# Patient Record
Sex: Female | Born: 1980 | Race: Black or African American | Hispanic: No | Smoking: Former smoker
Health system: Southern US, Community
[De-identification: ages and names within clinical notes are randomized; demographics above are authoritative.]

## PROBLEM LIST (undated history)

## (undated) DIAGNOSIS — O039 Complete or unspecified spontaneous abortion without complication: Secondary | ICD-10-CM

## (undated) DIAGNOSIS — E039 Hypothyroidism, unspecified: Secondary | ICD-10-CM

## (undated) DIAGNOSIS — K439 Ventral hernia without obstruction or gangrene: Secondary | ICD-10-CM

## (undated) DIAGNOSIS — K429 Umbilical hernia without obstruction or gangrene: Secondary | ICD-10-CM

## (undated) DIAGNOSIS — J302 Other seasonal allergic rhinitis: Secondary | ICD-10-CM

## (undated) DIAGNOSIS — A599 Trichomoniasis, unspecified: Secondary | ICD-10-CM

## (undated) DIAGNOSIS — E079 Disorder of thyroid, unspecified: Secondary | ICD-10-CM

## (undated) DIAGNOSIS — N83209 Unspecified ovarian cyst, unspecified side: Secondary | ICD-10-CM

## (undated) DIAGNOSIS — B029 Zoster without complications: Secondary | ICD-10-CM

## (undated) DIAGNOSIS — K8020 Calculus of gallbladder without cholecystitis without obstruction: Secondary | ICD-10-CM

## (undated) HISTORY — PX: APPENDECTOMY: SHX54

## (undated) HISTORY — DX: Calculus of gallbladder without cholecystitis without obstruction: K80.20

## (undated) HISTORY — DX: Ventral hernia without obstruction or gangrene: K43.9

## (undated) HISTORY — PX: IRRIGATION AND DEBRIDEMENT ABSCESS: SHX5252

## (undated) HISTORY — PX: ABDOMINAL SURGERY: SHX537

## (undated) HISTORY — PX: DILATION AND CURETTAGE OF UTERUS: SHX78

---

## 2008-02-23 ENCOUNTER — Emergency Department (HOSPITAL_COMMUNITY): Admission: EM | Admit: 2008-02-23 | Discharge: 2008-02-23 | Payer: Self-pay | Admitting: Emergency Medicine

## 2008-06-07 ENCOUNTER — Inpatient Hospital Stay (HOSPITAL_COMMUNITY): Admission: AD | Admit: 2008-06-07 | Discharge: 2008-06-07 | Payer: Self-pay | Admitting: Obstetrics & Gynecology

## 2008-06-07 ENCOUNTER — Ambulatory Visit: Payer: Self-pay | Admitting: Obstetrics and Gynecology

## 2008-09-19 ENCOUNTER — Emergency Department (HOSPITAL_COMMUNITY): Admission: EM | Admit: 2008-09-19 | Discharge: 2008-09-19 | Payer: Self-pay | Admitting: Emergency Medicine

## 2008-11-27 ENCOUNTER — Emergency Department (HOSPITAL_COMMUNITY): Admission: EM | Admit: 2008-11-27 | Discharge: 2008-11-28 | Payer: Self-pay | Admitting: Emergency Medicine

## 2008-11-27 ENCOUNTER — Other Ambulatory Visit: Payer: Self-pay | Admitting: Obstetrics & Gynecology

## 2009-01-24 ENCOUNTER — Inpatient Hospital Stay (HOSPITAL_COMMUNITY): Admission: AD | Admit: 2009-01-24 | Discharge: 2009-01-24 | Payer: Self-pay | Admitting: Obstetrics & Gynecology

## 2009-01-24 ENCOUNTER — Ambulatory Visit: Payer: Self-pay | Admitting: Family Medicine

## 2009-02-10 ENCOUNTER — Emergency Department (HOSPITAL_COMMUNITY): Admission: EM | Admit: 2009-02-10 | Discharge: 2009-02-10 | Payer: Self-pay | Admitting: Emergency Medicine

## 2009-08-30 ENCOUNTER — Emergency Department (HOSPITAL_COMMUNITY): Admission: EM | Admit: 2009-08-30 | Discharge: 2009-08-30 | Payer: Self-pay | Admitting: Emergency Medicine

## 2009-09-01 ENCOUNTER — Ambulatory Visit (HOSPITAL_COMMUNITY): Admission: EM | Admit: 2009-09-01 | Discharge: 2009-09-02 | Payer: Self-pay | Admitting: Emergency Medicine

## 2010-04-28 LAB — URINALYSIS, ROUTINE W REFLEX MICROSCOPIC
Bilirubin Urine: NEGATIVE
Glucose, UA: NEGATIVE mg/dL
Protein, ur: NEGATIVE mg/dL

## 2010-04-28 LAB — ANAEROBIC CULTURE

## 2010-04-28 LAB — DIFFERENTIAL
Basophils Absolute: 0 10*3/uL (ref 0.0–0.1)
Basophils Relative: 0 % (ref 0–1)
Eosinophils Absolute: 0.2 10*3/uL (ref 0.0–0.7)
Lymphocytes Relative: 11 % — ABNORMAL LOW (ref 12–46)
Monocytes Relative: 4 % (ref 3–12)
Neutro Abs: 15.2 10*3/uL — ABNORMAL HIGH (ref 1.7–7.7)

## 2010-04-28 LAB — CBC
HCT: 39.3 % (ref 36.0–46.0)
MCHC: 34.3 g/dL (ref 30.0–36.0)
MCV: 89 fL (ref 78.0–100.0)
Platelets: 251 10*3/uL (ref 150–400)
Platelets: 265 10*3/uL (ref 150–400)
RBC: 4.48 MIL/uL (ref 3.87–5.11)
RDW: 14.2 % (ref 11.5–15.5)
RDW: 14.5 % (ref 11.5–15.5)

## 2010-04-28 LAB — CULTURE, ROUTINE-ABSCESS: Culture: NO GROWTH

## 2010-04-28 LAB — URINE CULTURE: Colony Count: 80000

## 2010-05-14 LAB — URINALYSIS, ROUTINE W REFLEX MICROSCOPIC
Bilirubin Urine: NEGATIVE
Glucose, UA: NEGATIVE mg/dL
Hgb urine dipstick: NEGATIVE
Ketones, ur: NEGATIVE mg/dL
Nitrite: NEGATIVE
Protein, ur: NEGATIVE mg/dL
Specific Gravity, Urine: 1.03 (ref 1.005–1.030)
Urobilinogen, UA: 1 mg/dL (ref 0.0–1.0)
pH: 6 (ref 5.0–8.0)

## 2010-05-14 LAB — CBC
HCT: 42.1 % (ref 36.0–46.0)
Hemoglobin: 14.2 g/dL (ref 12.0–15.0)
MCHC: 33.6 g/dL (ref 30.0–36.0)
MCV: 89 fL (ref 78.0–100.0)
Platelets: 249 K/uL (ref 150–400)
RBC: 4.73 MIL/uL (ref 3.87–5.11)
RDW: 14.8 % (ref 11.5–15.5)
WBC: 11.4 10*3/uL — ABNORMAL HIGH (ref 4.0–10.5)

## 2010-05-14 LAB — GC/CHLAMYDIA PROBE AMP, GENITAL
Chlamydia, DNA Probe: NEGATIVE
GC Probe Amp, Genital: NEGATIVE

## 2010-05-14 LAB — COMPREHENSIVE METABOLIC PANEL WITH GFR
AST: 20 U/L (ref 0–37)
Albumin: 4 g/dL (ref 3.5–5.2)
Alkaline Phosphatase: 56 U/L (ref 39–117)
BUN: 9 mg/dL (ref 6–23)
CO2: 23 meq/L (ref 19–32)
Chloride: 109 meq/L (ref 96–112)
GFR calc Af Amer: >60 mL/min (ref >60)
GFR calc non Af Amer: >60 mL/min (ref >60)
Potassium: 4.2 meq/L (ref 3.5–5.1)
Total Bilirubin: 0.6 mg/dL (ref 0.3–1.2)

## 2010-05-14 LAB — WET PREP, GENITAL
Trich, Wet Prep: NONE SEEN
Yeast Wet Prep HPF POC: NONE SEEN

## 2010-05-14 LAB — COMPREHENSIVE METABOLIC PANEL
ALT: 21 U/L (ref 0–35)
Calcium: 8.9 mg/dL (ref 8.4–10.5)
Creatinine, Ser: 0.75 mg/dL (ref 0.4–1.2)
Glucose, Bld: 106 mg/dL — ABNORMAL HIGH (ref 70–99)
Sodium: 139 mEq/L (ref 135–145)
Total Protein: 7.8 g/dL (ref 6.0–8.3)

## 2010-05-14 LAB — DIFFERENTIAL
Basophils Absolute: 0 K/uL (ref 0.0–0.1)
Basophils Relative: 0 % (ref 0–1)
Eosinophils Absolute: 0.3 K/uL (ref 0.0–0.7)
Eosinophils Relative: 3 % (ref 0–5)
Lymphocytes Relative: 15 % (ref 12–46)
Lymphs Abs: 1.7 10*3/uL (ref 0.7–4.0)
Monocytes Absolute: 0.3 K/uL (ref 0.1–1.0)
Monocytes Relative: 3 % (ref 3–12)
Neutro Abs: 9 10*3/uL — ABNORMAL HIGH (ref 1.7–7.7)
Neutrophils Relative %: 79 % — ABNORMAL HIGH (ref 43–77)

## 2010-05-14 LAB — POCT PREGNANCY, URINE: Preg Test, Ur: NEGATIVE

## 2010-05-14 LAB — LIPASE, BLOOD: Lipase: 21 U/L (ref 11–59)

## 2010-05-15 LAB — URINALYSIS, ROUTINE W REFLEX MICROSCOPIC
Bilirubin Urine: NEGATIVE
Ketones, ur: 15 mg/dL — AB
Leukocytes, UA: NEGATIVE
Nitrite: NEGATIVE
Specific Gravity, Urine: >1.03 — ABNORMAL HIGH (ref 1.005–1.030)
Urobilinogen, UA: 0.2 mg/dL (ref 0.0–1.0)
pH: 5 (ref 5.0–8.0)

## 2010-05-15 LAB — COMPREHENSIVE METABOLIC PANEL
Alkaline Phosphatase: 45 U/L (ref 39–117)
BUN: 10 mg/dL (ref 6–23)
CO2: 27 mEq/L (ref 19–32)
Chloride: 104 mEq/L (ref 96–112)
Creatinine, Ser: 0.78 mg/dL (ref 0.4–1.2)
GFR calc non Af Amer: >60 mL/min (ref >60)
Potassium: 3.7 mEq/L (ref 3.5–5.1)
Total Bilirubin: 1.1 mg/dL (ref 0.3–1.2)

## 2010-05-15 LAB — DIFFERENTIAL
Basophils Absolute: 0 10*3/uL (ref 0.0–0.1)
Basophils Relative: 0 % (ref 0–1)
Eosinophils Relative: 1 % (ref 0–5)
Lymphocytes Relative: 6 % — ABNORMAL LOW (ref 12–46)
Neutro Abs: 8.5 10*3/uL — ABNORMAL HIGH (ref 1.7–7.7)

## 2010-05-15 LAB — CBC
HCT: 45.1 % (ref 36.0–46.0)
Hemoglobin: 14.8 g/dL (ref 12.0–15.0)
MCV: 89.5 fL (ref 78.0–100.0)
RBC: 5.04 MIL/uL (ref 3.87–5.11)
WBC: 9.3 10*3/uL (ref 4.0–10.5)

## 2010-05-15 LAB — TSH: TSH: 0.812 u[IU]/mL (ref 0.350–4.500)

## 2010-05-15 LAB — URINE MICROSCOPIC-ADD ON

## 2010-05-15 LAB — LIPASE, BLOOD: Lipase: 13 U/L (ref 11–59)

## 2010-05-17 LAB — DIFFERENTIAL
Eosinophils Absolute: 0.4 10*3/uL (ref 0.0–0.7)
Lymphs Abs: 3.6 10*3/uL (ref 0.7–4.0)
Monocytes Relative: 4 % (ref 3–12)
Neutro Abs: 4.9 10*3/uL (ref 1.7–7.7)
Neutrophils Relative %: 53 % (ref 43–77)

## 2010-05-17 LAB — URINALYSIS, ROUTINE W REFLEX MICROSCOPIC
Hgb urine dipstick: NEGATIVE
Nitrite: NEGATIVE
Protein, ur: NEGATIVE mg/dL
Specific Gravity, Urine: >1.03 — ABNORMAL HIGH (ref 1.005–1.030)
Urobilinogen, UA: 0.2 mg/dL (ref 0.0–1.0)

## 2010-05-17 LAB — CBC
MCV: 90.1 fL (ref 78.0–100.0)
Platelets: 235 10*3/uL (ref 150–400)
RBC: 4.39 MIL/uL (ref 3.87–5.11)
WBC: 9.4 10*3/uL (ref 4.0–10.5)

## 2010-05-17 LAB — POCT PREGNANCY, URINE: Preg Test, Ur: NEGATIVE

## 2010-05-17 LAB — BASIC METABOLIC PANEL
BUN: 7 mg/dL (ref 6–23)
Calcium: 9.2 mg/dL (ref 8.4–10.5)
Creatinine, Ser: 0.81 mg/dL (ref 0.4–1.2)
GFR calc Af Amer: >60 mL/min (ref >60)
GFR calc non Af Amer: >60 mL/min (ref >60)

## 2010-05-20 LAB — URINE MICROSCOPIC-ADD ON

## 2010-05-20 LAB — URINALYSIS, ROUTINE W REFLEX MICROSCOPIC
Glucose, UA: NEGATIVE mg/dL
Protein, ur: NEGATIVE mg/dL
Specific Gravity, Urine: 1.026 (ref 1.005–1.030)
Urobilinogen, UA: 0.2 mg/dL (ref 0.0–1.0)

## 2010-05-23 LAB — URINALYSIS, ROUTINE W REFLEX MICROSCOPIC
Bilirubin Urine: NEGATIVE
Glucose, UA: NEGATIVE mg/dL
Hgb urine dipstick: NEGATIVE
Specific Gravity, Urine: 1.025 (ref 1.005–1.030)

## 2010-05-23 LAB — TSH: TSH: 1.092 u[IU]/mL (ref 0.350–4.500)

## 2010-10-18 ENCOUNTER — Emergency Department (HOSPITAL_COMMUNITY)
Admission: EM | Admit: 2010-10-18 | Discharge: 2010-10-18 | Disposition: A | Discharge status: Home / Self Care | Payer: Medicaid Other | Attending: Emergency Medicine | Admitting: Emergency Medicine

## 2010-10-18 DIAGNOSIS — E039 Hypothyroidism, unspecified: Secondary | ICD-10-CM | POA: Insufficient documentation

## 2010-10-18 DIAGNOSIS — R51 Headache: Secondary | ICD-10-CM | POA: Insufficient documentation

## 2010-10-18 DIAGNOSIS — R509 Fever, unspecified: Secondary | ICD-10-CM | POA: Insufficient documentation

## 2010-10-18 DIAGNOSIS — R197 Diarrhea, unspecified: Secondary | ICD-10-CM | POA: Insufficient documentation

## 2010-10-18 DIAGNOSIS — IMO0001 Reserved for inherently not codable concepts without codable children: Secondary | ICD-10-CM | POA: Insufficient documentation

## 2010-10-18 LAB — DIFFERENTIAL
Lymphocytes Relative: 42 % (ref 12–46)
Lymphs Abs: 2.6 10*3/uL (ref 0.7–4.0)
Monocytes Relative: 5 % (ref 3–12)
Neutro Abs: 3.1 10*3/uL (ref 1.7–7.7)
Neutrophils Relative %: 50 % (ref 43–77)

## 2010-10-18 LAB — CBC
HCT: 39.2 % (ref 36.0–46.0)
Hemoglobin: 13.1 g/dL (ref 12.0–15.0)
MCH: 29 pg (ref 26.0–34.0)
MCV: 86.9 fL (ref 78.0–100.0)
RBC: 4.51 MIL/uL (ref 3.87–5.11)

## 2010-10-18 LAB — POCT I-STAT, CHEM 8
BUN: 8 mg/dL (ref 6–23)
Chloride: 107 mEq/L (ref 96–112)
Sodium: 138 mEq/L (ref 135–145)

## 2010-12-29 ENCOUNTER — Emergency Department (HOSPITAL_COMMUNITY)
Admission: EM | Admit: 2010-12-29 | Discharge: 2010-12-29 | Disposition: A | Discharge status: Home / Self Care | Payer: Self-pay | Attending: Emergency Medicine | Admitting: Emergency Medicine

## 2010-12-29 DIAGNOSIS — F17200 Nicotine dependence, unspecified, uncomplicated: Secondary | ICD-10-CM | POA: Insufficient documentation

## 2010-12-29 DIAGNOSIS — L02419 Cutaneous abscess of limb, unspecified: Secondary | ICD-10-CM | POA: Insufficient documentation

## 2010-12-29 DIAGNOSIS — R6883 Chills (without fever): Secondary | ICD-10-CM | POA: Insufficient documentation

## 2010-12-29 DIAGNOSIS — L0291 Cutaneous abscess, unspecified: Secondary | ICD-10-CM

## 2010-12-29 MED ORDER — LIDOCAINE-EPINEPHRINE 2 %-1:100000 IJ SOLN
INTRAMUSCULAR | Status: AC
  Filled 2010-12-29: qty 1

## 2010-12-29 MED ORDER — DOXYCYCLINE HYCLATE 100 MG PO CAPS: 100.0000 mg | ORAL_CAPSULE | Freq: Two times a day (BID) | ORAL | Status: AC

## 2010-12-29 MED ORDER — HYDROCODONE-ACETAMINOPHEN 5-325 MG PO TABS: 1.0000 | ORAL_TABLET | Freq: Four times a day (QID) | ORAL | Status: AC | PRN

## 2010-12-29 NOTE — ED Provider Notes (Signed)
History     CSN: [id number] Arrival date & time: 12/29/2010  9:08 AM   First MD Initiated Contact with Patient 12/29/10 [phone]      Chief Complaint  Patient presents with  . Abscess    pt states abscess inner right thigh states no present drainage states painful to touch     (Consider location/radiation/quality/duration/timing/severity/associated sxs/prior treatment) Patient is a 30 y.o. female presenting with abscess. The history is provided by the patient.  Abscess  This is a recurrent problem. The current episode started less than one week ago. The onset was gradual. The problem occurs rarely. The problem has been gradually worsening. Affected Location: right upper inner thigh. The problem is severe. The abscess is characterized by painfulness and swelling. The abscess first occurred at home. Pertinent negatives include no fever, no vomiting and no cough. Associated symptoms comments: Positive chills. Past medical history comments: Pt reports a prior perirectal abscess that failed ED treatment and required surgical drainage.    History reviewed. No pertinent past medical history.  History reviewed. No pertinent past surgical history.  No family history on file.  History  Substance Use Topics  . Smoking status: Current Everyday Smoker  . Smokeless tobacco: Not on file  . Alcohol Use: No     Review of Systems  Constitutional: Positive for chills. Negative for fever.  HENT: Negative for ear pain and neck pain.   Eyes: Negative for pain and redness.  Respiratory: Negative for cough and shortness of breath.   Cardiovascular: Negative for chest pain.  Gastrointestinal: Negative for nausea, vomiting and abdominal pain.  Genitourinary: Negative for dysuria, vaginal discharge and pelvic pain.  Musculoskeletal: Negative for back pain and gait problem.  Skin: Positive for wound. Negative for pallor and rash.  Neurological: Negative for dizziness, weakness, numbness and headaches.    Hematological: Does not bruise/bleed easily.  Psychiatric/Behavioral: Negative for confusion.    Allergies  Review of patient's allergies indicates no known allergies.  Home Medications  No current outpatient prescriptions on file.  BP 112/66  Pulse 62  Temp 99.3 F (37.4 C)  Resp 20  SpO2 76%  Physical Exam  Constitutional: She is oriented to person, place, and time. She appears well-developed and well-nourished. No distress.  HENT:  Head: Normocephalic and atraumatic.  Eyes: Pupils are equal, round, and reactive to light.  Neck: Normal range of motion. Neck supple.  Cardiovascular: Normal rate and regular rhythm.   Pulmonary/Chest: Effort normal and breath sounds normal.  Abdominal: Soft. She exhibits no distension. There is no tenderness.  Musculoskeletal: Normal range of motion. She exhibits no edema and no tenderness.  Neurological: She is alert and oriented to person, place, and time. Coordination normal.  Skin: Skin is warm and dry. No rash noted.       2cm abscess with central fluctuance to right proximal inner thigh. No surrounding erythema.  Psychiatric: Her behavior is normal.    ED Course  Procedures (including critical care time) INCISION AND DRAINAGE Performed by: Lorenz Coaster Consent: Verbal consent obtained. Risks and benefits: risks, benefits and alternatives were discussed Timeout performed prior to procedure Type: abscess  Body area: right proximal inner thigh  Anesthesia: local infiltration  Local anesthetic: lidocaine 1% with epinephrine  Anesthetic total: 1 ml  Complexity: complex Blunt dissection to break up loculations  Drainage: purulent  Drainage amount: large  Packing material: 1/4 in iodoform gauze  Patient tolerance: Patient tolerated the procedure well with no immediate complications.  1. Abscess       MDM  Pt with abscess to inner thigh. Wound drained and packed. As pt has recurrent abscesses, will tx with  abx and have advised f/u in 2 days.        Elwyn Reach [place], [place] 12/29/10 1013

## 2010-12-29 NOTE — ED Provider Notes (Signed)
History/physical exam/procedure(s) were performed by non-physician practitioner and as supervising physician I was immediately available for consultation/collaboration. I have reviewed all notes and am in agreement with care and plan.   Jarissa Sheriff S Elai Vanwyk, MD 12/29/10 1612 

## 2011-01-01 ENCOUNTER — Emergency Department (HOSPITAL_COMMUNITY)
Admission: EM | Admit: 2011-01-01 | Discharge: 2011-01-01 | Disposition: A | Discharge status: Home / Self Care | Payer: Self-pay | Attending: Emergency Medicine | Admitting: Emergency Medicine

## 2011-01-01 ENCOUNTER — Encounter (HOSPITAL_COMMUNITY): Payer: Self-pay

## 2011-01-01 DIAGNOSIS — R110 Nausea: Secondary | ICD-10-CM | POA: Insufficient documentation

## 2011-01-01 DIAGNOSIS — L02419 Cutaneous abscess of limb, unspecified: Secondary | ICD-10-CM | POA: Insufficient documentation

## 2011-01-01 DIAGNOSIS — Z09 Encounter for follow-up examination after completed treatment for conditions other than malignant neoplasm: Secondary | ICD-10-CM | POA: Insufficient documentation

## 2011-01-01 DIAGNOSIS — F17200 Nicotine dependence, unspecified, uncomplicated: Secondary | ICD-10-CM | POA: Insufficient documentation

## 2011-01-01 HISTORY — DX: Disorder of thyroid, unspecified: E07.9

## 2011-01-01 MED ORDER — LIDOCAINE-EPINEPHRINE 2 %-1:100000 IJ SOLN
10.0000 mL | Freq: Once | INTRAMUSCULAR | Status: AC
  Administered 2011-01-01: 5 mL
  Filled 2011-01-01: qty 1

## 2011-01-01 MED ORDER — ONDANSETRON 8 MG PO TBDP: 8.0000 mg | ORAL_TABLET | Freq: Three times a day (TID) | ORAL | Status: AC | PRN

## 2011-01-01 NOTE — ED Provider Notes (Signed)
History     CSN: [id number] Arrival date & time: 01/01/2011  2:53 PM   First MD Initiated Contact with Patient 01/01/11 1605   4:44 PM HPI Valerie Walter is a 30 y.o. F that is here for a wound recheck. Reports abscess on right medial superior thigh that was drained 3 days ago. Reports that wound has healed over packing. Reports minimally painful and has not used pain medication yet. Also states she has been noncompliant with thryroid meds since she moved to [place] 2 years ago. States she has not found a new PCP. Reports increased anxiety,10 lbs weight loss, n/v, and fatigue. Discussed we could order thyroid labs but this wound take 1-2 days to return. Patient states she would rather have PCP referrals.  Patient is a 30 y.o. female presenting with wound check. The history is provided by the patient.  Wound Check  She was treated in the ED 2 to 3 days ago. Previous treatment in the ED includes I&D of abscess. Treatments since wound repair include oral antibiotics. There has been no drainage from the wound. The redness has improved. The swelling has improved. The pain has improved.    Past Medical History  Diagnosis Date  . Thyroid disease     Past Surgical History  Procedure Date  . Appendectomy   . Irrigation and debridement abscess     No family history on file.  History  Substance Use Topics  . Smoking status: Current Everyday Smoker -- 0.5 packs/day  . Smokeless tobacco: Not on file  . Alcohol Use: No    OB History    Grav Para Term Preterm Abortions TAB SAB Ect Mult Living                  Review of Systems  Constitutional: Negative for fever and chills.  HENT: Negative for rhinorrhea.   Eyes: Negative for redness.  Respiratory: Negative for cough, shortness of breath and wheezing.   Cardiovascular: Negative for chest pain and palpitations.  Gastrointestinal: Negative for nausea.  Skin:       Abscess    Allergies  Review of patient's allergies indicates no  known allergies.  Home Medications   Current Outpatient Rx  Name Route Sig Dispense Refill  . DOXYCYCLINE HYCLATE 100 MG PO CAPS Oral Take 1 capsule (100 mg total) by mouth 2 (two) times daily. 14 capsule 0  . HYDROCODONE-ACETAMINOPHEN 5-325 MG PO TABS Oral Take 1-2 tablets by mouth every 6 (six) hours as needed for pain. 10 tablet 0    BP 108/65  Pulse 70  Temp(Src) 98.4 F (36.9 C) (Oral)  Resp 18  Ht 5\' 4"  (1.626 m)  SpO2 100%  Physical Exam  Vitals reviewed. Constitutional: She is oriented to person, place, and time. Vital signs are normal. She appears well-developed and well-nourished. No distress.  HENT:  Head: Normocephalic and atraumatic.  Eyes: Pupils are equal, round, and reactive to light.  Neck: Neck supple.  Pulmonary/Chest: Effort normal.  Neurological: She is alert and oriented to person, place, and time.  Skin: Skin is warm and dry. No rash noted. No erythema. No pallor.       Small healing I&D'ed 1cm indurated abscess on right superior medial thigh  Psychiatric: She has a normal mood and affect. Her behavior is normal.    ED Course  Procedures   Used lidocaine 1% with epi to anesthesize prior abscess in medial superior right thigh. Pain improved  Wound thoroughly examined. No packing  found to remove. Advised continued ABx tx and warm water soaks. Advised return for worsening symptoms. Patient is ready for d/c MDM          Thomasene Lot, [place] 01/01/11 [phone]

## 2011-01-01 NOTE — ED Provider Notes (Signed)
Medical screening examination/treatment/procedure(s) were performed by non-physician practitioner and as supervising physician I was immediately available for consultation/collaboration.  Jasmine Awe, MD 01/01/11 [phone]

## 2011-01-01 NOTE — ED Notes (Signed)
Pt states she feels the wound is healing around the packing placed in the 17th.  Pt states she has been nauseated but not sure it is from the abx she was given.  State it is sore but hasnt needed the pain meds.  Has been under a lot of stress lately and feels like her "iron or thyroid is low" and "feels like crap"  States she doesn't have a PMD here but should be on thyroid medication and hasn't been taking it for about a year.  Would like her "thyroid and iron levels" checked also.

## 2011-05-30 ENCOUNTER — Inpatient Hospital Stay (HOSPITAL_COMMUNITY)
Admission: AD | Admit: 2011-05-30 | Discharge: 2011-05-30 | Disposition: A | Discharge status: Home / Self Care | Payer: Medicaid Other | Source: Ambulatory Visit | Attending: Family Medicine | Admitting: Family Medicine

## 2011-05-30 ENCOUNTER — Encounter (HOSPITAL_COMMUNITY): Payer: Self-pay | Admitting: *Deleted

## 2011-05-30 DIAGNOSIS — N938 Other specified abnormal uterine and vaginal bleeding: Secondary | ICD-10-CM

## 2011-05-30 DIAGNOSIS — N939 Abnormal uterine and vaginal bleeding, unspecified: Secondary | ICD-10-CM

## 2011-05-30 DIAGNOSIS — Z30432 Encounter for removal of intrauterine contraceptive device: Secondary | ICD-10-CM

## 2011-05-30 DIAGNOSIS — N949 Unspecified condition associated with female genital organs and menstrual cycle: Secondary | ICD-10-CM | POA: Insufficient documentation

## 2011-05-30 DIAGNOSIS — I951 Orthostatic hypotension: Secondary | ICD-10-CM

## 2011-05-30 HISTORY — DX: Trichomoniasis, unspecified: A59.9

## 2011-05-30 LAB — CBC
MCH: 29 pg (ref 26.0–34.0)
MCV: 87.6 fL (ref 78.0–100.0)
Platelets: 263 10*3/uL (ref 150–400)
RDW: 14.4 % (ref 11.5–15.5)
WBC: 7.4 10*3/uL (ref 4.0–10.5)

## 2011-05-30 LAB — URINALYSIS, ROUTINE W REFLEX MICROSCOPIC
Bilirubin Urine: NEGATIVE
Ketones, ur: NEGATIVE mg/dL
Leukocytes, UA: NEGATIVE
Nitrite: NEGATIVE
Specific Gravity, Urine: >1.03 — ABNORMAL HIGH (ref 1.005–1.030)
Urobilinogen, UA: 0.2 mg/dL (ref 0.0–1.0)

## 2011-05-30 MED ORDER — IBUPROFEN 800 MG PO TABS
800.0000 mg | ORAL_TABLET | Freq: Once | ORAL | Status: AC
  Administered 2011-05-30: 800 mg via ORAL
  Filled 2011-05-30: qty 1

## 2011-05-30 NOTE — MAU Provider Note (Signed)
History     CSN: [id number]  Arrival date and time: 05/30/11 1318   None     Chief Complaint  Patient presents with  . Vaginal Bleeding  . Nausea  . Dizziness   HPI 31 yo female who presents with vaginal bleeding that started last night.  She has a Mirena IUD that was placed approximately 6 years ago.  She denies vaginal discharge. She is sexually active and has never been pregnant before. She has not had periods while having Mirena, just mild spotting.  She would like the IUD removed.  Also having episodic dizziness with position changes. Her dizziness is worsened by standing up. This has been occurring 2-3 times a day over the past couple of months. She denies excessive bleeding.    OB History    Grav Para Term Preterm Abortions TAB SAB Ect Mult Living   4    3 1 2   1       Past Medical History  Diagnosis Date  . Thyroid disease   . Trichomonas     Past Surgical History  Procedure Date  . Appendectomy   . Irrigation and debridement abscess     No family history on file.  History  Substance Use Topics  . Smoking status: Current Everyday Smoker -- 0.5 packs/day  . Smokeless tobacco: Not on file  . Alcohol Use: No    Allergies: No Known Allergies  Prescriptions prior to admission  Medication Sig Dispense Refill  . loratadine (CLARITIN) 10 MG tablet Take 10 mg by mouth daily.        Review of Systems  Constitutional: Negative for fever, chills, weight loss and malaise/fatigue.  Eyes: Negative for blurred vision, double vision and photophobia.  Gastrointestinal: Negative for nausea, vomiting, abdominal pain, diarrhea and constipation.  Genitourinary: Negative for urgency, frequency and hematuria.  Neurological: Positive for dizziness. Negative for tremors, weakness and headaches.   Physical Exam   Blood pressure 109/75, pulse 68, temperature 98.8 F (37.1 C), temperature source Oral, resp. rate 20, height 5\' 3"  (1.6 m), weight 67.132 kg (148 lb), last  menstrual period 05/29/2011.  Physical Exam  Constitutional: She is oriented to person, place, and time. She appears well-developed and well-nourished.  GI: Soft. Bowel sounds are normal. She exhibits no distension. There is no tenderness. There is no rebound and no guarding.  Genitourinary: Vagina normal.  Neurological: She is alert and oriented to person, place, and time.  Psychiatric: She has a normal mood and affect. Her behavior is normal. Judgment and thought content normal.   Results for orders placed during the hospital encounter of 05/30/11 (from the past 24 hour(s))  URINALYSIS, ROUTINE W REFLEX MICROSCOPIC     Status: Abnormal   Collection Time   05/30/11  2:00 PM      Component Value Range   Color, Urine AMBER (*) YELLOW    APPearance CLEAR  CLEAR    Specific Gravity, Urine >1.030 (*) 1.005 - 1.030    pH 5.5  5.0 - 8.0    Glucose, UA NEGATIVE  NEGATIVE (mg/dL)   Hgb urine dipstick MODERATE (*) NEGATIVE    Bilirubin Urine NEGATIVE  NEGATIVE    Ketones, ur NEGATIVE  NEGATIVE (mg/dL)   Protein, ur NEGATIVE  NEGATIVE (mg/dL)   Urobilinogen, UA 0.2  0.0 - 1.0 (mg/dL)   Nitrite NEGATIVE  NEGATIVE    Leukocytes, UA NEGATIVE  NEGATIVE   URINE MICROSCOPIC-ADD ON     Status: Abnormal   Collection Time  05/30/11  2:00 PM      Component Value Range   Squamous Epithelial / LPF FEW (*) RARE    WBC, UA 0-2  <3 (WBC/hpf)   RBC / HPF 0-2  <3 (RBC/hpf)   Bacteria, UA FEW (*) RARE    Urine-Other MUCOUS PRESENT    POCT PREGNANCY, URINE     Status: Normal   Collection Time   05/30/11  2:05 PM      Component Value Range   Preg Test, Ur NEGATIVE  NEGATIVE   CBC     Status: Normal   Collection Time   05/30/11  2:39 PM      Component Value Range   WBC 7.4  4.0 - 10.5 (K/uL)   RBC 4.45  3.87 - 5.11 (MIL/uL)   Hemoglobin 12.9  12.0 - 15.0 (g/dL)   HCT 40.9  81.1 - 91.4 (%)   MCV 87.6  78.0 - 100.0 (fL)   MCH 29.0  26.0 - 34.0 (pg)   MCHC 33.1  30.0 - 36.0 (g/dL)   RDW 78.2  95.6 -  21.3 (%)   Platelets 263  150 - 400 (K/uL)    MAU Course  Procedures  Assessment and Plan  1.  Vaginal bleeding - likely menses.  Will remove IUD per patient wishes.  Discussed with patient that she could become pregnant.  Also discussed risks of procedure. 2.  Orthostatic hypotension. Discussed fluid intake.  Hemoglobin reassuring.    IUD Removal  Patient was in the dorsal lithotomy position, normal external genitalia was noted.  A speculum was placed in the patient's vagina, normal discharge was noted, no lesions. The nulliparous cervix was visualized, no lesions, no abnormal discharge.  The strings of the IUD was grasped and pulled using ring forceps.  The IUD was successfully removed in its entirety.  Patient tolerated the procedure well.     Timeka Goette JEHIEL 05/30/2011, 2:46 PM

## 2011-05-30 NOTE — MAU Note (Signed)
Thinking was preg.  Has IUD, but has had for about 5 yrs.  Had period, doesn't usually have period.  She and boyfriend have been trying.  Has been feeling dizzy and sick.

## 2011-05-30 NOTE — Discharge Instructions (Signed)
Orthostatic Hypotension Orthostatic hypotension is a sudden fall in blood pressure. It occurs when a person goes from a sitting or lying position to a standing position. CAUSES   Loss of body fluids (dehydration).   Medicines that lower blood pressure.   Sudden changes in posture, such as sudden standing when you have been sitting or lying down.   Taking too much of your medicine.  SYMPTOMS   Lightheadedness or dizziness.   Fainting or near-fainting.   A fast heart rate (tachycardia).   Weakness.   Feeling tired (fatigue).  DIAGNOSIS  Your caregiver may find the cause of orthostatic hypotension through:  A history and/or physical exam.   Checking your blood pressure. Your caregiver will check your blood pressure when you are:   Lying down.   Sitting.   Standing.   Tilt table testing. In this test, you are placed on a table that goes from a lying position to a standing position. You will be strapped to the table. This test helps to monitor your blood pressure and heart rate when you are in different positions.  TREATMENT   If orthostatic hypotension is caused by your medicines, your caregiver will need to adjust your dosage. Do not stop or adjust your medicine on your own.   When changing positions, make these changes slowly. This allows your body to adjust to the different position.   Compression stockings that are worn on your lower legs may be helpful.   Your caregiver may have you consume extra salt. Do not add extra salt to your diet unless directed by your caregiver.   Eat frequent, small meals. Avoid sudden standing after eating.   Avoid hot showers or excessive heat.   Your caregiver may give you fluids through the vein (intravenous).   Your caregiver may put you on medicine to help enhance fluid retention.  SEEK IMMEDIATE MEDICAL CARE IF:   You faint or have a near-fainting episode. Call your local emergency services (911 in [place]   You have or  develop chest pain.   You feel sick to your stomach (nauseous) or vomit.   You have a loss of feeling or movement in your arms or legs.   You have difficulty talking, slurred speech, or you are unable to talk.   You have difficulty thinking or have confused thinking.  MAKE SURE YOU:   Understand these instructions.   Will watch your condition.   Will get help right away if you are not doing well or get worse.  Document Released: 01/18/2002 Document Revised: 01/17/2011 Document Reviewed: 05/13/2008 Exit[employer] Patient Information 2012 [employer] 

## 2011-07-29 ENCOUNTER — Inpatient Hospital Stay (HOSPITAL_COMMUNITY)
Admission: AD | Admit: 2011-07-29 | Discharge: 2011-07-29 | Disposition: A | Discharge status: Home / Self Care | Payer: Medicaid Other | Source: Ambulatory Visit | Attending: Obstetrics & Gynecology | Admitting: Obstetrics & Gynecology

## 2011-07-29 ENCOUNTER — Encounter (HOSPITAL_COMMUNITY): Payer: Self-pay | Admitting: *Deleted

## 2011-07-29 DIAGNOSIS — Z2989 Encounter for other specified prophylactic measures: Secondary | ICD-10-CM | POA: Insufficient documentation

## 2011-07-29 DIAGNOSIS — Z6791 Unspecified blood type, Rh negative: Secondary | ICD-10-CM

## 2011-07-29 DIAGNOSIS — O039 Complete or unspecified spontaneous abortion without complication: Secondary | ICD-10-CM | POA: Insufficient documentation

## 2011-07-29 DIAGNOSIS — Z298 Encounter for other specified prophylactic measures: Secondary | ICD-10-CM | POA: Insufficient documentation

## 2011-07-29 HISTORY — DX: Other seasonal allergic rhinitis: J30.2

## 2011-07-29 LAB — URINALYSIS, ROUTINE W REFLEX MICROSCOPIC
Glucose, UA: NEGATIVE mg/dL
Specific Gravity, Urine: 1.02 (ref 1.005–1.030)

## 2011-07-29 LAB — URINE MICROSCOPIC-ADD ON

## 2011-07-29 LAB — KLEIHAUER-BETKE STAIN
Fetal Cells %: 0 %
Quantitation Fetal Hemoglobin: 0 mL

## 2011-07-29 LAB — WET PREP, GENITAL: Trich, Wet Prep: NONE SEEN

## 2011-07-29 LAB — POCT PREGNANCY, URINE: Preg Test, Ur: NEGATIVE

## 2011-07-29 MED ORDER — RHO D IMMUNE GLOBULIN 1500 UNIT/2ML IJ SOLN
300.0000 ug | INTRAMUSCULAR | Status: AC
  Administered 2011-07-29: 300 ug via INTRAMUSCULAR
  Filled 2011-07-29: qty 2

## 2011-07-29 NOTE — Discharge Instructions (Signed)
Miscarriage (Spontaneous Miscarriage)  A miscarriage is when you lose your baby before the twentieth week of pregnancy. Miscarriages happen in 15-20% of pregnancies. Most miscarriages happen in the first 13 weeks of the pregnancy. In medical terms, this is called a spontaneous miscarriage or early pregnancy loss. No further treatment is needed when the miscarriage is complete and all products of conception have been passed out of the body. You can begin trying for another pregnancy as soon as your caregiver says it is okay.  CAUSES    Most causes are not known.   Genetic problems like abnormal, not enough or too many chromosomes.   Infection of the cervix or uterus.   An abnormal shaped uterus, fibroid tumors or congenital abnormalities.   Hormone problems.   Medical problems.   Incompetent cervix, the tissue in the cervix is not strong enough to hold the pregnancy.   Smoking, too much alcohol use and illegal drugs.   Trauma.  SYMPTOMS    Bleeding or spotting from the vagina.   Cramping of the lower abdomen.   Passing of fluid from the vagina with or without cramps or pain.   Passing fetal tissue.  TREATMENT    Sometimes no further treatment is necessary if you pass all the tissue in the uterus.   If partial parts of the fetus or placenta remain in the body (incomplete miscarriage), tissue left behind may become infected. Usually a D and C (Dilatation and Curettage) suction or scrapping of the uterus is necessary to remove the remaining tissue in uterus. The procedure is only done when your caregiver knows that there is no chance for the pregnancy to continue. This is determined by a physical exam, a negative pregnancy test, blood tests and perhaps an ultrasound revealing a dead fetus or no fetus developing because a problem occurred at conception (when the sperm and egg unite).   Medications may be necessary, antibiotics if there is an infection or medications to contract the uterus if there is a  lot of bleeding.   If you have Rh negative blood and your partner is Rh positive, you will need a Rho-gam shot (an immune globulin vaccine). This will protect your baby from having Rh blood problems in future pregnancies.  HOME CARE INSTRUCTIONS    Your caregiver may order bed rest (up to the bathroom only). He or she may allow you to continue light activity. You may need to make arrangements for the care of children and for any other responsibilities.   Keep track of the number of pads you use each day and how soaked (saturated) they are. Record this information.   Do not use tampons. Do not douche or have sexual intercourse until approved by your caregiver.   Only take over-the-counter or prescription medicines for pain, discomfort or fever as directed by your caregiver.   Do not take aspirin because it can cause bleeding.   It is very important to keep all follow-up appointments for re-evaluations and continuing management.   Tell your caregiver if you are experiencing domestic violence.   Women who have an Rh negative blood type (i.e., A, B, AB, or O negative) need to receive a drug called Rh(D) immune globulin (RhoGam). This medicine helps protect future fetuses against problems that can occur if an Rh negative mother is carrying a baby who is Rh positive.   If you and/or your partner are having problems with guilt or grieving, talk to your caregiver or seek counseling to help   you cope with the pregnancy loss. Allow enough time to grieve before trying to get pregnant again.  SEEK IMMEDIATE MEDICAL CARE IF:    You have severe cramps or pain in your stomach, back, or belly (abdomen).   You have a fever.   You pass large clots or tissue. Save any tissue for your caregiver to inspect.   Your bleeding increases.   You become light-headed, weak or have fainting episodes.   You develop chills.  Document Released: 07/24/2000 Document Revised: 01/17/2011 Document Reviewed: 08/31/2007  [employer] Patient  Information 2012 [employer].

## 2011-07-29 NOTE — MAU Note (Signed)
Patient states she has had 2 positive home pregnancy tests and one at a clinic in [facility]. Started bleeding this am and cramping this am. Scant pink on tissue when patient is wiping in the bathroom.

## 2011-07-29 NOTE — MAU Note (Signed)
Miscarriage comfort pack given to pt.

## 2011-07-29 NOTE — MAU Provider Note (Signed)
History     CSN: [id number]  Arrival date and time: 07/29/11 1040   First Provider Initiated Contact with Patient 07/29/11 1158      Chief Complaint  Patient presents with  . Possible Pregnancy  . Vaginal Bleeding   HPI  LMP uncertain, about 6 weeks ago.  Had IUD (Mirena), which was removed 2 months ago.  She currently desires pregnancy.  Cramping started last night, worsened today. Has noted some blood on wiping.  Stated abdominal pain today is mild.  Has required cerclage before.to carry pregnancy to term.  She had a pregnancy test performed at a clinic and has a documented positive pregnancy test at that time, but they did not perform a blood type.  Past Medical History  Diagnosis Date  . Thyroid disease   . Trichomonas   . Seasonal allergies     Past Surgical History  Procedure Date  . Appendectomy   . Irrigation and debridement abscess     History reviewed. No pertinent family history.  History  Substance Use Topics  . Smoking status: Current Everyday Smoker -- 0.2 packs/day  . Smokeless tobacco: Not on file  . Alcohol Use: Yes     occasional    Allergies: No Known Allergies  Prescriptions prior to admission  Medication Sig Dispense Refill  . aspirin-acetaminophen-caffeine (EXCEDRIN MIGRAINE) 250-250-65 MG per tablet Take 2 tablets by mouth every 6 (six) hours as needed. For headache      . loratadine (CLARITIN) 10 MG tablet Take 10 mg by mouth daily as needed. For allergies        ROS  See HPI Physical Exam   Blood pressure 113/82, pulse 86, temperature 99.5 F (37.5 C), temperature source Oral, resp. rate 16, height 5\' 3"  (1.6 m), weight 66.815 kg (147 lb 4.8 oz), last menstrual period 05/29/2011, SpO2 100.00%.  Physical Exam  Constitutional: She is oriented to person, place, and time. She appears well-developed and well-nourished. No distress.  HENT:  Head: Normocephalic and atraumatic.  Eyes: Conjunctivae and EOM are normal. Right eye  exhibits no discharge. Left eye exhibits no discharge. No scleral icterus.  Neck: No tracheal deviation present.  Cardiovascular: Normal rate, regular rhythm and normal heart sounds.   Respiratory: Effort normal and breath sounds normal. No stridor.  GI: Soft. She exhibits no distension. There is tenderness (Tenderness in right adnexal region). There is no rebound and no guarding.  Genitourinary:       No vaginal or labial lesions.  Blood in vagina but none visualized coming through cervical os.  GC/chlam, wet prep sent.  On bimanual exam, no mass felt.  Right adnexal tenderness.  Neurological: She is alert and oriented to person, place, and time.  Skin: Skin is warm and dry.  Psychiatric:       Mood "sad", affect tearful.  Behavior, thought content, judgement consistent with situation (probable loss of desired pregnancy).   Results for orders placed during the hospital encounter of 07/29/11 (from the past 24 hour(s))  URINALYSIS, ROUTINE W REFLEX MICROSCOPIC     Status: Abnormal   Collection Time   07/29/11 10:45 AM      Component Value Range   Color, Urine YELLOW  YELLOW   APPearance CLOUDY (*) CLEAR   Specific Gravity, Urine 1.020  1.005 - 1.030   pH 6.0  5.0 - 8.0   Glucose, UA NEGATIVE  NEGATIVE mg/dL   Hgb urine dipstick LARGE (*) NEGATIVE   Bilirubin Urine NEGATIVE  NEGATIVE  Ketones, ur NEGATIVE  NEGATIVE mg/dL   Protein, ur NEGATIVE  NEGATIVE mg/dL   Urobilinogen, UA 0.2  0.0 - 1.0 mg/dL   Nitrite NEGATIVE  NEGATIVE   Leukocytes, UA NEGATIVE  NEGATIVE  URINE MICROSCOPIC-ADD ON     Status: Abnormal   Collection Time   07/29/11 10:45 AM      Component Value Range   Squamous Epithelial / LPF FEW (*) RARE   WBC, UA 0-2  <3 WBC/hpf   RBC / HPF 0-2  <3 RBC/hpf   Bacteria, UA FEW (*) RARE   Urine-Other MUCOUS PRESENT    POCT PREGNANCY, URINE     Status: Normal   Collection Time   07/29/11 10:57 AM      Component Value Range   Preg Test, Ur NEGATIVE  NEGATIVE  HCG,  QUANTITATIVE, PREGNANCY     Status: Normal   Collection Time   07/29/11 12:05 PM      Component Value Range   hCG, Beta Chain, Quant, S <1  <5 mIU/mL  TYPE AND SCREEN     Status: Normal   Collection Time   07/29/11 12:05 PM      Component Value Range   ABO/RH(D) O NEG     Antibody Screen NEG     Sample Expiration 08/01/2011    ABO/RH     Status: Normal   Collection Time   07/29/11 12:05 PM      Component Value Range   ABO/RH(D) O NEG    RH IG WORKUP (INCLUDES ABO/RH)     Status: Normal (Preliminary result)   Collection Time   07/29/11 12:05 PM      Component Value Range   Gestational Age(Wks) 6     ABO/RH(D) O NEG     Fetal Screen POS     Unit Number [id number]     Blood Component Type RHIG     Unit division 00     Status of Unit ISSUED     Transfusion Status OK TO TRANSFUSE    KLEIHAUER-BETKE STAIN     Status: Normal   Collection Time   07/29/11 12:05 PM      Component Value Range   Fetal Cells % 0     Quantitation Fetal Hemoglobin 0     # Vials RhIg 1    WET PREP, GENITAL     Status: Abnormal   Collection Time   07/29/11 12:40 PM      Component Value Range   Yeast Wet Prep HPF POC NONE SEEN  NONE SEEN   Trich, Wet Prep NONE SEEN  NONE SEEN   Clue Cells Wet Prep HPF POC FEW (*) NONE SEEN   WBC, Wet Prep HPF POC FEW (*) NONE SEEN    MAU Course  Procedures None  Assessment and Plan  Miscarriage Rh neg, bleeding: rhophylac Beta hCG less than one, no need for ultrasound.  Clancy Gourd 07/29/2011, 12:01 PM  I have been involved with the care of this patient. I have reviewed this patient's vital signs, nurses notes and  appropriate labs. Since Bhcg is <1 today will not do ultrasound. Patient to follow up with GYN Clinic.

## 2011-07-29 NOTE — MAU Note (Signed)
Patient has also had nausea with occasional vomiting.

## 2011-07-30 LAB — RH IG WORKUP (INCLUDES ABO/RH)
Fetal Screen: POSITIVE
Unit division: 0

## 2011-07-30 LAB — GC/CHLAMYDIA PROBE AMP, GENITAL
Chlamydia, DNA Probe: NEGATIVE
GC Probe Amp, Genital: NEGATIVE

## 2011-08-21 ENCOUNTER — Ambulatory Visit: Payer: Self-pay | Admitting: Family

## 2011-09-12 ENCOUNTER — Other Ambulatory Visit: Payer: Self-pay | Admitting: Emergency Medicine

## 2011-09-12 ENCOUNTER — Encounter (HOSPITAL_COMMUNITY): Payer: Self-pay

## 2011-09-12 ENCOUNTER — Telehealth (HOSPITAL_COMMUNITY): Payer: Self-pay | Admitting: *Deleted

## 2011-09-12 ENCOUNTER — Emergency Department (INDEPENDENT_AMBULATORY_CARE_PROVIDER_SITE_OTHER)
Admission: EM | Admit: 2011-09-12 | Discharge: 2011-09-12 | Disposition: A | Discharge status: Home / Self Care | Payer: Self-pay | Source: Home / Self Care | Attending: Emergency Medicine | Admitting: Emergency Medicine

## 2011-09-12 DIAGNOSIS — N63 Unspecified lump in unspecified breast: Secondary | ICD-10-CM

## 2011-09-12 DIAGNOSIS — N6320 Unspecified lump in the left breast, unspecified quadrant: Secondary | ICD-10-CM

## 2011-09-12 NOTE — ED Notes (Signed)
Order for mammography and [place] as needed for diagnosis faxed to the [facility] and confirmation received. I called to schedule the appointment.  I called them and scheduled pt. for Tues. 8/6 @ 0900.  Pt. should arrive at 0845.  I called pt. and left a message to call.

## 2011-09-12 NOTE — ED Notes (Signed)
States she noted a lump under her nipple a few days ago, and initially thought it may be due to pregnancy (last menses short) but is also concerned as breast CA is reportedly strong on her family, mother died in early 46's from breast CA

## 2011-09-12 NOTE — ED Notes (Signed)
Pt. called back and given address, phone number, directions, date and time of the appointment. Pt.told to arrive 15 min. Early, no lotion, deoderant, powder or perfume.  Pt. wants to change the time. I told her she would have to call them to do that. Pt. said she is getting her Medicaid renewed. I told her to bring it with her or they will charge her self pay charge of $110.00.  Pt. voiced understanding. Vassie Moselle 09/12/2011

## 2011-09-17 ENCOUNTER — Other Ambulatory Visit: Payer: Self-pay

## 2011-12-03 ENCOUNTER — Encounter (HOSPITAL_COMMUNITY): Payer: Self-pay | Admitting: *Deleted

## 2011-12-03 ENCOUNTER — Inpatient Hospital Stay (HOSPITAL_COMMUNITY)
Admission: AD | Admit: 2011-12-03 | Discharge: 2011-12-03 | Disposition: A | Discharge status: Home / Self Care | Payer: Medicaid Other | Source: Ambulatory Visit | Attending: Obstetrics & Gynecology | Admitting: Obstetrics & Gynecology

## 2011-12-03 DIAGNOSIS — Z3202 Encounter for pregnancy test, result negative: Secondary | ICD-10-CM | POA: Insufficient documentation

## 2011-12-03 DIAGNOSIS — R109 Unspecified abdominal pain: Secondary | ICD-10-CM | POA: Insufficient documentation

## 2011-12-03 DIAGNOSIS — R51 Headache: Secondary | ICD-10-CM | POA: Insufficient documentation

## 2011-12-03 LAB — POCT PREGNANCY, URINE: Preg Test, Ur: NEGATIVE

## 2011-12-03 NOTE — MAU Note (Signed)
Pt signed AMA paper, risks and benefits explained to pt.

## 2011-12-03 NOTE — MAU Note (Signed)
Had IUD, was removed in March- nothing since- trying to conceive.

## 2011-12-03 NOTE — MAU Note (Signed)
Don't know if preg or not.  Period came on (10/13) Was a wk late, only lasted a couple days, then was light spotting. Then started feeling real sick, been getting headaches, felt like having a fever.

## 2012-01-10 ENCOUNTER — Encounter (HOSPITAL_COMMUNITY): Payer: Self-pay | Admitting: *Deleted

## 2012-01-10 ENCOUNTER — Emergency Department (HOSPITAL_COMMUNITY)
Admission: EM | Admit: 2012-01-10 | Discharge: 2012-01-10 | Discharge status: Against Medical Advice | Payer: Medicaid Other | Attending: Emergency Medicine | Admitting: Emergency Medicine

## 2012-01-10 DIAGNOSIS — F419 Anxiety disorder, unspecified: Secondary | ICD-10-CM

## 2012-01-10 DIAGNOSIS — R6889 Other general symptoms and signs: Secondary | ICD-10-CM

## 2012-01-10 DIAGNOSIS — F411 Generalized anxiety disorder: Secondary | ICD-10-CM | POA: Insufficient documentation

## 2012-01-10 DIAGNOSIS — Z8639 Personal history of other endocrine, nutritional and metabolic disease: Secondary | ICD-10-CM | POA: Insufficient documentation

## 2012-01-10 DIAGNOSIS — F17200 Nicotine dependence, unspecified, uncomplicated: Secondary | ICD-10-CM | POA: Insufficient documentation

## 2012-01-10 DIAGNOSIS — R109 Unspecified abdominal pain: Secondary | ICD-10-CM | POA: Insufficient documentation

## 2012-01-10 DIAGNOSIS — R112 Nausea with vomiting, unspecified: Secondary | ICD-10-CM | POA: Insufficient documentation

## 2012-01-10 DIAGNOSIS — Z862 Personal history of diseases of the blood and blood-forming organs and certain disorders involving the immune mechanism: Secondary | ICD-10-CM | POA: Insufficient documentation

## 2012-01-10 DIAGNOSIS — Z7982 Long term (current) use of aspirin: Secondary | ICD-10-CM | POA: Insufficient documentation

## 2012-01-10 DIAGNOSIS — Z8619 Personal history of other infectious and parasitic diseases: Secondary | ICD-10-CM | POA: Insufficient documentation

## 2012-01-10 HISTORY — DX: Complete or unspecified spontaneous abortion without complication: O03.9

## 2012-01-10 LAB — URINALYSIS, ROUTINE W REFLEX MICROSCOPIC
Bilirubin Urine: NEGATIVE
Hgb urine dipstick: NEGATIVE
Nitrite: NEGATIVE
Protein, ur: NEGATIVE mg/dL
Specific Gravity, Urine: 1.009 (ref 1.005–1.030)
Urobilinogen, UA: 0.2 mg/dL (ref 0.0–1.0)

## 2012-01-10 LAB — POCT PREGNANCY, URINE: Preg Test, Ur: NEGATIVE

## 2012-01-10 MED ORDER — SODIUM CHLORIDE 0.9 % IV BOLUS (SEPSIS): 1000.0000 mL | Freq: Once | INTRAVENOUS | Status: DC

## 2012-01-10 MED ORDER — LORAZEPAM 1 MG PO TABS: 1.0000 mg | ORAL_TABLET | Freq: Four times a day (QID) | ORAL | Status: DC | PRN

## 2012-01-10 MED ORDER — LORAZEPAM 1 MG PO TABS
0.5000 mg | ORAL_TABLET | Freq: Once | ORAL | Status: AC
  Administered 2012-01-10: 0.5 mg via ORAL
  Filled 2012-01-10: qty 1

## 2012-01-10 NOTE — ED Provider Notes (Signed)
History     CSN: [id number]  Arrival date & time 01/10/12  0052   First MD Initiated Contact with Patient 01/10/12 0104      Chief Complaint  Patient presents with  . multiple complaints     (Consider location/radiation/quality/duration/timing/severity/associated sxs/prior treatment) HPI Comments: Patient with history of anxiety presents with complaint of syncope. Patient states that she "passed out" today. She states that she thinks that could be pregnant. LMP: beginning of this month. She also states that she is worried that this could be due to her thyroid. Patient states that she was told that she needs to take thyroid medication "for the rest of my life", but has been non-adherent for a few years. Associated symptoms include nausea, vomiting, lower abdominal pain.  Denies head trauma but states that she was unconscious for 5 minutes per witness. Denies fevers or chills. Denies history or arrythmia, anemia, or seizures. Denies dysuria, vaginal discharge, or sores.   The history is provided by the patient. No language interpreter was used.    Past Medical History  Diagnosis Date  . Thyroid disease   . Trichomonas   . Seasonal allergies   . Miscarriage     Past Surgical History  Procedure Date  . Appendectomy   . Irrigation and debridement abscess     Family History  Problem Relation Age of Onset  . Cancer Mother     History  Substance Use Topics  . Smoking status: Current Every Day Smoker -- 0.2 packs/day  . Smokeless tobacco: Not on file  . Alcohol Use: Yes     Comment: occasional    OB History    Grav Para Term Preterm Abortions TAB SAB Ect Mult Living   4    3 1 2   1       Review of Systems  Constitutional: Negative for fever and chills.  Gastrointestinal: Positive for nausea, vomiting and abdominal pain.  Genitourinary: Negative for dysuria, vaginal discharge and genital sores.  Psychiatric/Behavioral: Positive for agitation. The patient is  nervous/anxious.     Allergies  Review of patient's allergies indicates no known allergies.  Home Medications   Current Outpatient Rx  Name  Route  Sig  Dispense  Refill  . ASPIRIN-ACETAMINOPHEN-CAFFEINE 250-250-65 MG PO TABS   Oral   Take 2 tablets by mouth every 6 (six) hours as needed. For headache         . LORATADINE 10 MG PO TABS   Oral   Take 10 mg by mouth daily as needed. For allergies           BP 129/72  Pulse 79  Temp 98.2 F (36.8 C) (Oral)  Resp 18  SpO2 99%  LMP 12/25/2011  Physical Exam  Nursing note and vitals reviewed. Constitutional: She is oriented to person, place, and time. She appears well-developed and well-nourished.       Patient seems anxious and tearful.  HENT:  Head: Normocephalic and atraumatic.  Mouth/Throat: Oropharynx is clear and moist. No oropharyngeal exudate.  Eyes: Conjunctivae normal and EOM are normal. No scleral icterus.  Neck: Normal range of motion. Neck supple.  Cardiovascular: Normal rate, regular rhythm and normal heart sounds.   Pulmonary/Chest: Effort normal and breath sounds normal.  Abdominal: Soft. Bowel sounds are normal. There is tenderness.       Mild tenderness to palpation of the RLQ.  Neurological: She is alert and oriented to person, place, and time. No cranial nerve deficit. Coordination normal.  Cranial nerves II-XII grossly intact. No pronator drift. Negative romberg.   Skin: Skin is warm and dry.    ED Course  Procedures (including critical care time)   Labs Reviewed  POCT PREGNANCY, URINE  URINALYSIS, ROUTINE W REFLEX MICROSCOPIC   Results for orders placed during the hospital encounter of 01/10/12  URINALYSIS, ROUTINE W REFLEX MICROSCOPIC      Component Value Range   Color, Urine YELLOW  YELLOW   APPearance CLEAR  CLEAR   Specific Gravity, Urine 1.009  1.005 - 1.030   pH 6.0  5.0 - 8.0   Glucose, UA NEGATIVE  NEGATIVE mg/dL   Hgb urine dipstick NEGATIVE  NEGATIVE   Bilirubin Urine  NEGATIVE  NEGATIVE   Ketones, ur NEGATIVE  NEGATIVE mg/dL   Protein, ur NEGATIVE  NEGATIVE mg/dL   Urobilinogen, UA 0.2  0.0 - 1.0 mg/dL   Nitrite NEGATIVE  NEGATIVE   Leukocytes, UA NEGATIVE  NEGATIVE  POCT PREGNANCY, URINE      Component Value Range   Preg Test, Ur NEGATIVE  NEGATIVE    No results found.   1. Anxiety   2. Multiple complaints       MDM  Patient presented to ED with multiple complaints. Initial plan was syncope/abdominal pain work-up up to include; CBC, BMP, UA, Urine pregnancy, EKG, orthostatic vitals, fluid bolus. pelvic, wet prep, gc/chlamydia. I even offered to check the patient's thyroid levels so that she could then follow-up with primary care. Patient stated that she got a text message saying that her babysitter needed her to pick up her child. Asked for primary care resources to evaluate her thyroid and left AMA. Patient understood that syncope needed further evaluation. Instructed to return to the ER if she has another syncopal episode. Discharged with referral to PCP. Return precautions given.         Pixie Casino, PA-C 01/10/12 [phone]

## 2012-01-10 NOTE — ED Notes (Addendum)
Pt states she has been vomiting off and on for the past few days. Pt states that she passed out tonight. Pt states that she has anxiety and has bad attacks, and states that she had a small anxiety attack tonight. Pt also states she is worried about being pregnant. Pt also states she has thyroid problems and has not taken her medications for 2 years. Pt also lost 20lbs in the last month.

## 2012-01-10 NOTE — ED Provider Notes (Signed)
Medical screening examination/treatment/procedure(s) were performed by non-physician practitioner and as supervising physician I was immediately available for consultation/collaboration.  Andreyah Natividad M Lauraann Missey, MD 01/10/12 0912 

## 2012-02-26 ENCOUNTER — Emergency Department (HOSPITAL_COMMUNITY)
Admission: EM | Admit: 2012-02-26 | Discharge: 2012-02-26 | Disposition: A | Discharge status: Home / Self Care | Payer: Medicaid Other | Attending: Emergency Medicine | Admitting: Emergency Medicine

## 2012-02-26 ENCOUNTER — Encounter (HOSPITAL_COMMUNITY): Payer: Self-pay | Admitting: *Deleted

## 2012-02-26 DIAGNOSIS — R51 Headache: Secondary | ICD-10-CM | POA: Insufficient documentation

## 2012-02-26 DIAGNOSIS — Z862 Personal history of diseases of the blood and blood-forming organs and certain disorders involving the immune mechanism: Secondary | ICD-10-CM | POA: Insufficient documentation

## 2012-02-26 DIAGNOSIS — Z8619 Personal history of other infectious and parasitic diseases: Secondary | ICD-10-CM | POA: Insufficient documentation

## 2012-02-26 DIAGNOSIS — N926 Irregular menstruation, unspecified: Secondary | ICD-10-CM | POA: Insufficient documentation

## 2012-02-26 DIAGNOSIS — Z3202 Encounter for pregnancy test, result negative: Secondary | ICD-10-CM | POA: Insufficient documentation

## 2012-02-26 DIAGNOSIS — F17200 Nicotine dependence, unspecified, uncomplicated: Secondary | ICD-10-CM | POA: Insufficient documentation

## 2012-02-26 DIAGNOSIS — M549 Dorsalgia, unspecified: Secondary | ICD-10-CM | POA: Insufficient documentation

## 2012-02-26 DIAGNOSIS — Z8639 Personal history of other endocrine, nutritional and metabolic disease: Secondary | ICD-10-CM | POA: Insufficient documentation

## 2012-02-26 LAB — WET PREP, GENITAL: Yeast Wet Prep HPF POC: NONE SEEN

## 2012-02-26 LAB — URINALYSIS, ROUTINE W REFLEX MICROSCOPIC
Bilirubin Urine: NEGATIVE
Glucose, UA: NEGATIVE mg/dL
Ketones, ur: NEGATIVE mg/dL
Protein, ur: NEGATIVE mg/dL
pH: 5.5 (ref 5.0–8.0)

## 2012-02-26 LAB — RPR: RPR Ser Ql: NONREACTIVE

## 2012-02-26 LAB — POCT PREGNANCY, URINE: Preg Test, Ur: NEGATIVE

## 2012-02-26 MED ORDER — ONDANSETRON 4 MG PO TBDP
4.0000 mg | ORAL_TABLET | Freq: Once | ORAL | Status: AC
  Administered 2012-02-26: 4 mg via ORAL
  Filled 2012-02-26 (×2): qty 1

## 2012-02-26 MED ORDER — ACETAMINOPHEN 325 MG PO TABS
650.0000 mg | ORAL_TABLET | Freq: Once | ORAL | Status: AC
  Administered 2012-02-26: 650 mg via ORAL
  Filled 2012-02-26: qty 2

## 2012-02-26 MED ORDER — NAPROXEN 500 MG PO TABS: 500.0000 mg | ORAL_TABLET | Freq: Two times a day (BID) | ORAL | Status: DC

## 2012-02-26 NOTE — ED Notes (Addendum)
Pt reports possible pregnancy. Pt had period January 8th, but not as long as usual. LNMP December 8th. Pt is trying to get pregnant. Headaches off and on, pain in stomach, nausea/vomiting. Took pregnancy test 3 days ago- negative. Had miscarriage in July 2012.

## 2012-02-26 NOTE — ED Provider Notes (Addendum)
History     CSN: [id number]  Arrival date & time 02/26/12  4098   First MD Initiated Contact with Patient 02/26/12 [phone]      Chief Complaint  Patient presents with  . Headache  . Possible Pregnancy  . Emesis    HPI The patient presents to the emergency room with complaints of abdominal cramping. She is concerned because her last menstrual period, earlier this month just lasted for 2 days and was atypical for her. She just had a small amount of bleeding and some brown discharge. She is concerned about the possibility of being pregnant although she took a home pregnancy test and it was negative.  Associated with the symptoms, the patient has had a few episodes of diarrhea. She has had nausea but has not had any vomiting.  She mentions that she has a history of having irregular menstrual periods. 6 years ago she had an IUD placed.  She was essentially amenorrheic for that period of time. In The last year she had the IUD removed and since that time has had some irregular menses. Past Medical History  Diagnosis Date  . Thyroid disease   . Trichomonas   . Seasonal allergies   . Miscarriage     Past Surgical History  Procedure Date  . Appendectomy   . Irrigation and debridement abscess     Family History  Problem Relation Age of Onset  . Cancer Mother     History  Substance Use Topics  . Smoking status: Current Every Day Smoker -- 0.2 packs/day  . Smokeless tobacco: Not on file  . Alcohol Use: Yes     Comment: occasional    OB History    Grav Para Term Preterm Abortions TAB SAB Ect Mult Living   4    3 1 2   1       Review of Systems  Constitutional: Negative for fever.  Respiratory: Negative for cough.   Genitourinary: Negative for dysuria.  Musculoskeletal: Positive for back pain.  Skin: Negative for rash.  All other systems reviewed and are negative.    Allergies  Review of patient's allergies indicates no known allergies.  Home Medications   Current  Outpatient Rx  Name  Route  Sig  Dispense  Refill  . ASPIRIN-ACETAMINOPHEN-CAFFEINE 250-250-65 MG PO TABS   Oral   Take 2 tablets by mouth every 6 (six) hours as needed. For headache         . LORATADINE 10 MG PO TABS   Oral   Take 10 mg by mouth daily as needed. For allergies         . LORAZEPAM 1 MG PO TABS   Oral   Take 1 tablet (1 mg total) by mouth every 6 (six) hours as needed for anxiety.   10 tablet   0     BP 129/86  Pulse 60  Temp 98 F (36.7 C) (Oral)  Resp 16  SpO2 100%  Physical Exam  Nursing note and vitals reviewed. Constitutional: She appears well-developed and well-nourished. No distress.  HENT:  Head: Normocephalic and atraumatic.  Right Ear: External ear normal.  Left Ear: External ear normal.  Eyes: Conjunctivae normal are normal. Right eye exhibits no discharge. Left eye exhibits no discharge. No scleral icterus.  Neck: Neck supple. No tracheal deviation present.  Cardiovascular: Normal rate, regular rhythm and intact distal pulses.   Pulmonary/Chest: Effort normal and breath sounds normal. No stridor. No respiratory distress. She has no wheezes. She  has no rales.  Abdominal: Soft. Bowel sounds are normal. She exhibits no distension. There is tenderness (Very mild in the lower abdomen no discrete focal areas of tenderness). There is no rebound and no guarding.  Genitourinary: Uterus normal. Pelvic exam was performed with patient supine. There is no rash, tenderness or lesion on the right labia. There is no rash, tenderness or lesion on the left labia. Cervix exhibits no motion tenderness and no discharge. Right adnexum displays no mass, no tenderness and no fullness. Left adnexum displays no mass, no tenderness and no fullness. Vaginal discharge (White) found.  Musculoskeletal: She exhibits no edema and no tenderness.  Neurological: She is alert. She has normal strength. No sensory deficit. Cranial nerve deficit:  no gross defecits noted. She exhibits  normal muscle tone. She displays no seizure activity. Coordination normal.  Skin: Skin is warm and dry. No rash noted.  Psychiatric: She has a normal mood and affect.    ED Course  Procedures (including critical care time)  Labs Reviewed  WET PREP, GENITAL - Abnormal; Notable for the following:    Clue Cells Wet Prep HPF POC FEW (*)     WBC, Wet Prep HPF POC FEW (*)     All other components within normal limits  URINALYSIS, ROUTINE W REFLEX MICROSCOPIC  POCT PREGNANCY, URINE  GC/CHLAMYDIA PROBE AMP  RPR   No results found.   1. Irregular menstrual bleeding       MDM  The patient's exam is benign. I doubt cervicitis or PID. Her symptoms may be related to her history of irregular menstrual periods. She asked about starting birth control pills. Will refer her to a primary doctor to discuss that further.  At this time there does not appear to be any evidence of an acute emergency medical condition and the patient appears stable for discharge with appropriate outpatient follow up.         Celene Kras, MD 02/26/12 1026  Celene Kras, MD 02/26/12 1030

## 2012-04-02 ENCOUNTER — Encounter (HOSPITAL_COMMUNITY): Payer: Self-pay | Admitting: *Deleted

## 2012-04-02 ENCOUNTER — Inpatient Hospital Stay (HOSPITAL_COMMUNITY)
Admission: AD | Admit: 2012-04-02 | Discharge: 2012-04-02 | Disposition: A | Discharge status: Home / Self Care | Payer: Medicaid Other | Source: Ambulatory Visit | Attending: Internal Medicine | Admitting: Internal Medicine

## 2012-04-02 DIAGNOSIS — R5381 Other malaise: Secondary | ICD-10-CM | POA: Insufficient documentation

## 2012-04-02 DIAGNOSIS — R109 Unspecified abdominal pain: Secondary | ICD-10-CM

## 2012-04-02 LAB — URINALYSIS, ROUTINE W REFLEX MICROSCOPIC
Bilirubin Urine: NEGATIVE
Hgb urine dipstick: NEGATIVE
Ketones, ur: NEGATIVE mg/dL
Protein, ur: NEGATIVE mg/dL
Urobilinogen, UA: 0.2 mg/dL (ref 0.0–1.0)

## 2012-04-02 LAB — CBC
HCT: 40.7 % (ref 36.0–46.0)
MCH: 29.5 pg (ref 26.0–34.0)
MCHC: 33.2 g/dL (ref 30.0–36.0)
MCV: 88.9 fL (ref 78.0–100.0)
RDW: 14.5 % (ref 11.5–15.5)
WBC: 6.6 10*3/uL (ref 4.0–10.5)

## 2012-04-02 NOTE — MAU Provider Note (Signed)
CC: Abdominal Pain     HPI Valerie Walter is a 32 y.o. [id number]  who presents with Concern that she's having difficulty getting pregnant. She thought perhaps she was pregnant since LMP on to a 14 was very light and 90 lasting 1-1/2 days. Denies abnormal bleeding. Has had intermittent lower abdominal pain but has no pain at present. Menses have been regular since IUD removed a year ago and she has been trying to get pregnant for about a year. She thought she may be pregnant since she's been very fatigued lately and her nipples are tender. Denies irritative vaginal discharge and declines STI testing. Denies dysuria, frequency or urgency of urination. Does not have PCP and has not had her thyroid checked for some time.   Past Medical History  Diagnosis Date  . Thyroid disease   . Trichomonas   . Seasonal allergies   . Miscarriage     OB History   Grav Para Term Preterm Abortions TAB SAB Ect Mult Living   5 2 2  3 1 2   2      # Outc Date GA Lbr Len/2nd Wgt Sex Del Anes PTL Lv   1 TRM 2/02    F SVD EPI  Yes   2 TRM 11/04    F SVD   Yes   3 SAB            4 TAB            5 SAB               Past Surgical History  Procedure Laterality Date  . Appendectomy    . Irrigation and debridement abscess      History   Social History  . Marital Status: Single    Spouse Name: N/A    Number of Children: N/A  . Years of Education: N/A   Occupational History  . Not on file.   Social History Main Topics  . Smoking status: Current Every Day Smoker -- 0.25 packs/day  . Smokeless tobacco: Not on file  . Alcohol Use: Yes     Comment: occasional  . Drug Use: No  . Sexually Active: Yes    Birth Control/ Protection: None   Other Topics Concern  . Not on file   Social History Narrative  . No narrative on file    No current facility-administered medications on file prior to encounter.   Current Outpatient Prescriptions on File Prior to Encounter  Medication Sig Dispense Refill   . LORazepam (ATIVAN) 1 MG tablet Take 1 tablet (1 mg total) by mouth every 6 (six) hours as needed for anxiety.  10 tablet  0    No Known Allergies  ROS Pertinent items in HPI  PHYSICAL EXAM There were no vitals filed for this visit. General: Well nourished, well developed female in no acute distress Cardiovascular: Normal rate Respiratory: Normal effort Abdomen: Soft, nontender except minimally tender suprapubic region.  Back: No CVAT Extremities: No edema Neurologic: Alert and oriented  LAB RESULTS Results for orders placed during the hospital encounter of 04/02/12 (from the past 24 hour(s))  URINALYSIS, ROUTINE W REFLEX MICROSCOPIC     Status: Abnormal   Collection Time    04/02/12 11:00 AM      Result Value Range   Color, Urine YELLOW  YELLOW   APPearance CLEAR  CLEAR   Specific Gravity, Urine >1.030 (*) 1.005 - 1.030   pH 5.5  5.0 - 8.0  Glucose, UA NEGATIVE  NEGATIVE mg/dL   Hgb urine dipstick NEGATIVE  NEGATIVE   Bilirubin Urine NEGATIVE  NEGATIVE   Ketones, ur NEGATIVE  NEGATIVE mg/dL   Protein, ur NEGATIVE  NEGATIVE mg/dL   Urobilinogen, UA 0.2  0.0 - 1.0 mg/dL   Nitrite NEGATIVE  NEGATIVE   Leukocytes, UA NEGATIVE  NEGATIVE  POCT PREGNANCY, URINE     Status: None   Collection Time    04/02/12 11:10 AM      Result Value Range   Preg Test, Ur NEGATIVE  NEGATIVE    IMAGING No results found.  MAU COURSE 1155: has to leave abruptly as her daughter had accident  CBC is pending  ASSESSMENT  1. Abdominal pain   2. Fatigue     PLAN Discharge home. See AVS for patient education. Tylenol for pain. Recommend OTC PNV while attempting pregnancy   Medication List    TAKE these medications       LORazepam 1 MG tablet  Commonly known as:  ATIVAN  Take 1 tablet (1 mg total) by mouth every 6 (six) hours as needed for anxiety.     sertraline 100 MG tablet  Commonly known as:  ZOLOFT  Take 100 mg by mouth daily.        Follow-up Information    Schedule an appointment as soon as possible for a visit with [facility]. (As needed)    Contact information:   [phone]      Danae Orleans, CNM 04/02/2012 11:36 AM

## 2012-04-02 NOTE — MAU Note (Signed)
Pt states LMP-03/21/2012, cycle was short (x2 days), and has had lower abd pain. Notes mucus like vaginal discharge as well.

## 2012-08-08 ENCOUNTER — Emergency Department (HOSPITAL_COMMUNITY)
Admission: EM | Admit: 2012-08-08 | Discharge: 2012-08-08 | Discharge status: Against Medical Advice | Payer: Medicaid Other | Source: Home / Self Care | Attending: Emergency Medicine | Admitting: Emergency Medicine

## 2012-08-08 ENCOUNTER — Encounter (HOSPITAL_COMMUNITY): Payer: Self-pay | Admitting: Family Medicine

## 2012-08-08 ENCOUNTER — Emergency Department (HOSPITAL_COMMUNITY): Payer: Medicaid Other

## 2012-08-08 ENCOUNTER — Encounter (HOSPITAL_COMMUNITY): Payer: Self-pay

## 2012-08-08 ENCOUNTER — Emergency Department (HOSPITAL_COMMUNITY)
Admission: EM | Admit: 2012-08-08 | Discharge: 2012-08-08 | Disposition: A | Discharge status: Home / Self Care | Payer: Medicaid Other | Attending: Emergency Medicine | Admitting: Emergency Medicine

## 2012-08-08 DIAGNOSIS — F17200 Nicotine dependence, unspecified, uncomplicated: Secondary | ICD-10-CM | POA: Insufficient documentation

## 2012-08-08 DIAGNOSIS — R197 Diarrhea, unspecified: Secondary | ICD-10-CM | POA: Insufficient documentation

## 2012-08-08 DIAGNOSIS — Z8742 Personal history of other diseases of the female genital tract: Secondary | ICD-10-CM | POA: Insufficient documentation

## 2012-08-08 DIAGNOSIS — N83209 Unspecified ovarian cyst, unspecified side: Secondary | ICD-10-CM | POA: Insufficient documentation

## 2012-08-08 DIAGNOSIS — J309 Allergic rhinitis, unspecified: Secondary | ICD-10-CM | POA: Insufficient documentation

## 2012-08-08 DIAGNOSIS — N949 Unspecified condition associated with female genital organs and menstrual cycle: Secondary | ICD-10-CM | POA: Insufficient documentation

## 2012-08-08 DIAGNOSIS — R109 Unspecified abdominal pain: Secondary | ICD-10-CM | POA: Insufficient documentation

## 2012-08-08 DIAGNOSIS — R0602 Shortness of breath: Secondary | ICD-10-CM | POA: Insufficient documentation

## 2012-08-08 DIAGNOSIS — Z79899 Other long term (current) drug therapy: Secondary | ICD-10-CM | POA: Insufficient documentation

## 2012-08-08 DIAGNOSIS — R0789 Other chest pain: Secondary | ICD-10-CM | POA: Insufficient documentation

## 2012-08-08 DIAGNOSIS — R002 Palpitations: Secondary | ICD-10-CM | POA: Insufficient documentation

## 2012-08-08 DIAGNOSIS — D259 Leiomyoma of uterus, unspecified: Secondary | ICD-10-CM | POA: Insufficient documentation

## 2012-08-08 DIAGNOSIS — R51 Headache: Secondary | ICD-10-CM | POA: Insufficient documentation

## 2012-08-08 DIAGNOSIS — Z2089 Contact with and (suspected) exposure to other communicable diseases: Secondary | ICD-10-CM | POA: Insufficient documentation

## 2012-08-08 DIAGNOSIS — E079 Disorder of thyroid, unspecified: Secondary | ICD-10-CM | POA: Insufficient documentation

## 2012-08-08 DIAGNOSIS — Z3202 Encounter for pregnancy test, result negative: Secondary | ICD-10-CM | POA: Insufficient documentation

## 2012-08-08 DIAGNOSIS — R5381 Other malaise: Secondary | ICD-10-CM | POA: Insufficient documentation

## 2012-08-08 LAB — COMPREHENSIVE METABOLIC PANEL
ALT: 10 U/L (ref 0–35)
AST: 13 U/L (ref 0–37)
Albumin: 4.3 g/dL (ref 3.5–5.2)
CO2: 20 mEq/L (ref 19–32)
Calcium: 9.1 mg/dL (ref 8.4–10.5)
Chloride: 104 mEq/L (ref 96–112)
GFR calc non Af Amer: >90 mL/min (ref >90)
Sodium: 136 mEq/L (ref 135–145)

## 2012-08-08 LAB — WET PREP, GENITAL
Trich, Wet Prep: NONE SEEN
Yeast Wet Prep HPF POC: NONE SEEN

## 2012-08-08 LAB — POCT PREGNANCY, URINE: Preg Test, Ur: NEGATIVE

## 2012-08-08 LAB — URINALYSIS, ROUTINE W REFLEX MICROSCOPIC
Bilirubin Urine: NEGATIVE
Glucose, UA: NEGATIVE mg/dL
Hgb urine dipstick: NEGATIVE
Specific Gravity, Urine: 1.029 (ref 1.005–1.030)
Urobilinogen, UA: 0.2 mg/dL (ref 0.0–1.0)

## 2012-08-08 LAB — CBC WITH DIFFERENTIAL/PLATELET
Basophils Absolute: 0 10*3/uL (ref 0.0–0.1)
Basophils Relative: 0 % (ref 0–1)
Eosinophils Relative: 1 % (ref 0–5)
Lymphocytes Relative: 22 % (ref 12–46)
MCHC: 35.7 g/dL (ref 30.0–36.0)
Neutro Abs: 10.3 10*3/uL — ABNORMAL HIGH (ref 1.7–7.7)
Platelets: 228 10*3/uL (ref 150–400)
RDW: 14.2 % (ref 11.5–15.5)
WBC: 14.2 10*3/uL — ABNORMAL HIGH (ref 4.0–10.5)

## 2012-08-08 LAB — LIPASE, BLOOD: Lipase: 21 U/L (ref 11–59)

## 2012-08-08 MED ORDER — MORPHINE SULFATE 4 MG/ML IJ SOLN
4.0000 mg | Freq: Once | INTRAMUSCULAR | Status: AC
  Administered 2012-08-08: 4 mg via INTRAVENOUS
  Filled 2012-08-08: qty 1

## 2012-08-08 MED ORDER — ONDANSETRON HCL 4 MG/2ML IJ SOLN
4.0000 mg | Freq: Once | INTRAMUSCULAR | Status: AC
  Administered 2012-08-08: 4 mg via INTRAVENOUS
  Filled 2012-08-08: qty 2

## 2012-08-08 MED ORDER — NAPROXEN 375 MG PO TABS: 375.0000 mg | ORAL_TABLET | Freq: Two times a day (BID) | ORAL | Status: DC

## 2012-08-08 MED ORDER — SODIUM CHLORIDE 0.9 % IV BOLUS (SEPSIS)
1000.0000 mL | Freq: Once | INTRAVENOUS | Status: AC
  Administered 2012-08-08: 1000 mL via INTRAVENOUS

## 2012-08-08 MED ORDER — ONDANSETRON HCL 4 MG PO TABS: 4.0000 mg | ORAL_TABLET | Freq: Four times a day (QID) | ORAL | Status: DC

## 2012-08-08 NOTE — ED Notes (Signed)
Pt reports low adominal pain/cramping and intermittent chills/diaphoresis since yesterday.

## 2012-08-08 NOTE — ED Notes (Signed)
Pt c/o RLQ pain that is rated 10/10 and described as unbearable.  Pt ambulatory in ED.

## 2012-08-08 NOTE — ED Notes (Signed)
EKG given to Dr. Bednar. Copy placed in pt chart. 

## 2012-08-08 NOTE — ED Provider Notes (Signed)
Medical screening examination/treatment/procedure(s) were performed by non-physician practitioner and as supervising physician I was immediately available for consultation/collaboration.  Sunnie Nielsen, MD 08/08/12 [phone]

## 2012-08-08 NOTE — ED Provider Notes (Signed)
History    CSN: [id number] Arrival date & time 08/08/12  0756  First MD Initiated Contact with Patient 08/08/12 [phone]     Chief Complaint  Patient presents with  . Abdominal Pain   (Consider location/radiation/quality/duration/timing/severity/associated sxs/prior Treatment) The history is provided by the patient. No language interpreter was used.  Valerie Walter is a 32 y/o F with PMHx of thyroid disease, trichomonas, anxiety, miscarriage presenting to the ED with RLQ abdominal pain that has been ongoing since yesterday morning, but has gotten progressively worse starting yesterday evening around 7:00PM as per patient. Patient described pain to be localized to the RLQ, without radiation, described as an intermittent sharp pain that lasts about one minute, but has intensified. Patient reported that motion makes the pain worse and taking a deep inhalation - nothing makes pain better - has not taken anything for the discomfort. Patient reported that she has been having a headache starting today along with her heart feeling like it is "fluttering" that started this morning with associated chest discomfort and shortness of breath. Patient reported that she felt feverish yesterday. Stated that she had diarrhea 2 days ago - reported last BM was yesterday where stools were firm. Patient reported that she is sexually active, reported last encounter was 2 weeks ago, but recently broke up with boyfriend - stated that she was trying to get pregnant, denied protection. Denied vomiting, melena, hematochezia, vaginal pain, vaginal discharge, vaginal bleeding, numbness, tingling.  PCP none    Past Medical History  Diagnosis Date  . Thyroid disease   . Trichomonas   . Seasonal allergies   . Miscarriage    Past Surgical History  Procedure Laterality Date  . Appendectomy    . Irrigation and debridement abscess     Family History  Problem Relation Age of Onset  . Cancer Mother    History  Substance  Use Topics  . Smoking status: Current Every Day Smoker -- 0.25 packs/day  . Smokeless tobacco: Not on file  . Alcohol Use: Yes     Comment: occasional   OB History   Grav Para Term Preterm Abortions TAB SAB Ect Mult Living   5 2 2  3 1 2   2      Review of Systems  Constitutional: Positive for fever. Negative for diaphoresis.  HENT: Negative for congestion, sore throat, trouble swallowing and neck stiffness.   Eyes: Negative for pain.  Respiratory: Positive for shortness of breath. Negative for cough and chest tightness.   Cardiovascular: Positive for palpitations. Negative for chest pain.  Gastrointestinal: Positive for nausea and abdominal pain (RLQ). Negative for vomiting, constipation, blood in stool and anal bleeding.  Genitourinary: Positive for pelvic pain (right sided and suprapubic). Negative for dysuria, decreased urine volume, vaginal bleeding, vaginal discharge, difficulty urinating and vaginal pain.  Neurological: Positive for headaches. Negative for weakness, light-headedness and numbness.  All other systems reviewed and are negative.    Allergies  Review of patient's allergies indicates no known allergies.  Home Medications   Current Outpatient Rx  Name  Route  Sig  Dispense  Refill  . ALPRAZolam (XANAX) 0.5 MG tablet   Oral   Take 0.5 mg by mouth 3 (three) times daily as needed for anxiety.         . sertraline (ZOLOFT) 100 MG tablet   Oral   Take 100 mg by mouth daily.         . naproxen (NAPROSYN) 375 MG tablet   Oral  Take 1 tablet (375 mg total) by mouth 2 (two) times daily.   20 tablet   0   . ondansetron (ZOFRAN) 4 MG tablet   Oral   Take 1 tablet (4 mg total) by mouth every 6 (six) hours.   12 tablet   0    BP 103/58  Pulse 56  Temp(Src) 98.5 F (36.9 C) (Oral)  Resp 22  SpO2 100%  LMP 07/12/2012 Physical Exam  Nursing note and vitals reviewed. Constitutional: She is oriented to person, place, and time. She appears well-developed  and well-nourished. No distress.  HENT:  Head: Normocephalic and atraumatic.  Mouth/Throat: Oropharynx is clear and moist. No oropharyngeal exudate.  Eyes: Conjunctivae and EOM are normal. Pupils are equal, round, and reactive to light. Right eye exhibits no discharge. Left eye exhibits no discharge.  Neck: Normal range of motion. Neck supple.  Cardiovascular: Normal rate, regular rhythm and normal heart sounds.  Exam reveals no friction rub.   No murmur heard. Pulses:      Radial pulses are 2+ on the right side, and 2+ on the left side.       Dorsalis pedis pulses are 2+ on the right side, and 2+ on the left side.  Pulmonary/Chest: Effort normal and breath sounds normal. No respiratory distress. She has no wheezes. She has no rales. She exhibits no tenderness.  Abdominal: Soft. Normal appearance and bowel sounds are normal. She exhibits no distension. There is no hepatosplenomegaly. There is tenderness in the right lower quadrant and suprapubic area. There is guarding and positive Murphy's sign. There is no rebound.    Exquisite tenderness to the RLQ, right pelvic, and suprapubic region upon palpation  Genitourinary: Vagina normal. No vaginal discharge found.  Speculum: Negative inflammation, swelling, erythema, lesions, sores noted the external genitalia. Negative swelling, inflammation, swelling, lesions, sores noted to the vaginal canal. Negative inflammation, swelling, strawberry appearance to the cervix.  Pelvic: negative CMT. Negative lesions, sores, masses palpation. Right adnexal tenderness upon palpation.    Lymphadenopathy:    She has no cervical adenopathy.  Neurological: She is alert and oriented to person, place, and time. She exhibits normal muscle tone. Coordination normal.  Skin: Skin is warm and dry. No rash noted. She is not diaphoretic. No erythema.  Psychiatric: She has a normal mood and affect. Her behavior is normal. Thought content normal.    ED Course  Procedures  (including critical care time)  11:50AM Patient re-assessed - still experiencing pelvic pain.   2:20PM Patient re-assessed reported that she is feeling much better. Mild discomfort in the right pelvic region, but much better than it has been. Patient denied chest pain and difficulty breathing - suspicion to be anxiety in nature, patient on Xanax and seeing a psychologist for anxiety control.    Date: 08/08/2012  Rate: 58  Rhythm: normal sinus rhythm  QRS Axis: normal  Intervals: normal  ST/T Wave abnormalities: normal  Conduction Disutrbances:none  Narrative Interpretation: RSR' in lead V2  Old EKG Reviewed: none available    Labs Reviewed  WET PREP, GENITAL - Abnormal; Notable for the following:    Clue Cells Wet Prep HPF POC FEW (*)    WBC, Wet Prep HPF POC MODERATE (*)    All other components within normal limits  CBC WITH DIFFERENTIAL - Abnormal; Notable for the following:    WBC 14.2 (*)    Neutro Abs 10.3 (*)    All other components within normal limits  URINALYSIS, ROUTINE W REFLEX  MICROSCOPIC - Abnormal; Notable for the following:    APPearance CLOUDY (*)    All other components within normal limits  GC/CHLAMYDIA PROBE AMP  COMPREHENSIVE METABOLIC PANEL  LIPASE, BLOOD  URINE MICROSCOPIC-ADD ON  POCT PREGNANCY, URINE   US Abdomen Complete  08/08/2012   *RADIOLOGY REPORT*  Clinical Data:  Abdominal pain.  ABDOMINAL ULTRASOUND COMPLETE  Comparison:  None.  Findings:  Gallbladder:  Post cholecystectomy.  Common Bile Duct:  Within normal limits in caliber.  Liver: No focal mass lesion identified.  Within normal limits in parenchymal echogenicity.  IVC:  Appears normal.  Pancreas:  No abnormality identified.  Spleen:  Within normal limits in size and echotexture.  Right kidney:  Normal in size and parenchymal echogenicity.  No evidence of mass or hydronephrosis.  Left kidney:  Normal in size and parenchymal echogenicity.  No evidence of mass or hydronephrosis.  Abdominal Aorta:   No aneurysm identified.  IMPRESSION: No acute pathology.   Original Report Authenticated By: Jolaine Click, M.D.   US Transvaginal Non-ob  08/08/2012   *RADIOLOGY REPORT*  Clinical Data: Right pelvic pain.  TRANSABDOMINAL AND TRANSVAGINAL ULTRASOUND OF PELVIS WITH DOPPLER WAVEFORM ANALYSIS.  Technique:  Both transabdominal and transvaginal ultrasound examinations of the pelvis were performed. Transabdominal technique was performed for global imaging of the pelvis including uterus, ovaries, adnexal regions, and pelvic cul-de-sac. Doppler waveform analysis of the ovaries was also performed.  It was necessary to proceed with endovaginal exam following the transabdominal exam to visualize the adnexa.  Comparison:  02/10/2009  Findings:  Uterus: Measures 9.4 x 5.3 x 6.1 cm.  There is a 1.2 x 0.9 x 0.7 cm fundal fibroid.  Endometrium: Measures 8 mm in thickness, within normal limits.  Right ovary:  Measures 4.8 x 1.8 x 2.5 cm and contains a complex but primarily hypoechoic internal focus measuring 2.3 x 1.4 x 0.9 cm without definite internal blood flow in this complex focus.  Left ovary: Measures 4.4 x 1.9 x 3.2 cm and appears unremarkable.  Doppler:  Arterial and venous waveforms are present bilaterally. Prior resistive index on the left than the right is probably attributable to measurement of arterial structure closer to the vascular pedicle.  Other findings: No free fluid  IMPRESSION:  1.  Complex 2.3 x 1.4 x 0.9 cm lesion in the right ovary without definite internal blood flow, probably a complex cyst.  Pregnancy test was negative today and accordingly this is not thought to represent an ectopic pregnancy.  Consider follow-up sonography in 6 weeks time to ensure resolution. 2.  No findings of ovarian torsion or additional significant ovarian findings.  No free pelvic fluid. 3.  Small uterine fundal fibroid.   Original Report Authenticated By: Gaylyn Rong, M.D.   US Pelvis Complete  08/08/2012   *RADIOLOGY  REPORT*  Clinical Data: Right pelvic pain.  TRANSABDOMINAL AND TRANSVAGINAL ULTRASOUND OF PELVIS WITH DOPPLER WAVEFORM ANALYSIS.  Technique:  Both transabdominal and transvaginal ultrasound examinations of the pelvis were performed. Transabdominal technique was performed for global imaging of the pelvis including uterus, ovaries, adnexal regions, and pelvic cul-de-sac. Doppler waveform analysis of the ovaries was also performed.  It was necessary to proceed with endovaginal exam following the transabdominal exam to visualize the adnexa.  Comparison:  02/10/2009  Findings:  Uterus: Measures 9.4 x 5.3 x 6.1 cm.  There is a 1.2 x 0.9 x 0.7 cm fundal fibroid.  Endometrium: Measures 8 mm in thickness, within normal limits.  Right ovary:  Measures 4.8  x 1.8 x 2.5 cm and contains a complex but primarily hypoechoic internal focus measuring 2.3 x 1.4 x 0.9 cm without definite internal blood flow in this complex focus.  Left ovary: Measures 4.4 x 1.9 x 3.2 cm and appears unremarkable.  Doppler:  Arterial and venous waveforms are present bilaterally. Prior resistive index on the left than the right is probably attributable to measurement of arterial structure closer to the vascular pedicle.  Other findings: No free fluid  IMPRESSION:  1.  Complex 2.3 x 1.4 x 0.9 cm lesion in the right ovary without definite internal blood flow, probably a complex cyst.  Pregnancy test was negative today and accordingly this is not thought to represent an ectopic pregnancy.  Consider follow-up sonography in 6 weeks time to ensure resolution. 2.  No findings of ovarian torsion or additional significant ovarian findings.  No free pelvic fluid. 3.  Small uterine fundal fibroid.   Original Report Authenticated By: Gaylyn Rong, M.D.   [place] Art/ven Flow Abd Pelv Doppler  08/08/2012   *RADIOLOGY REPORT*  Clinical Data: Right pelvic pain.  TRANSABDOMINAL AND TRANSVAGINAL ULTRASOUND OF PELVIS WITH DOPPLER WAVEFORM ANALYSIS.  Technique:  Both  transabdominal and transvaginal ultrasound examinations of the pelvis were performed. Transabdominal technique was performed for global imaging of the pelvis including uterus, ovaries, adnexal regions, and pelvic cul-de-sac. Doppler waveform analysis of the ovaries was also performed.  It was necessary to proceed with endovaginal exam following the transabdominal exam to visualize the adnexa.  Comparison:  02/10/2009  Findings:  Uterus: Measures 9.4 x 5.3 x 6.1 cm.  There is a 1.2 x 0.9 x 0.7 cm fundal fibroid.  Endometrium: Measures 8 mm in thickness, within normal limits.  Right ovary:  Measures 4.8 x 1.8 x 2.5 cm and contains a complex but primarily hypoechoic internal focus measuring 2.3 x 1.4 x 0.9 cm without definite internal blood flow in this complex focus.  Left ovary: Measures 4.4 x 1.9 x 3.2 cm and appears unremarkable.  Doppler:  Arterial and venous waveforms are present bilaterally. Prior resistive index on the left than the right is probably attributable to measurement of arterial structure closer to the vascular pedicle.  Other findings: No free fluid  IMPRESSION:  1.  Complex 2.3 x 1.4 x 0.9 cm lesion in the right ovary without definite internal blood flow, probably a complex cyst.  Pregnancy test was negative today and accordingly this is not thought to represent an ectopic pregnancy.  Consider follow-up sonography in 6 weeks time to ensure resolution. 2.  No findings of ovarian torsion or additional significant ovarian findings.  No free pelvic fluid. 3.  Small uterine fundal fibroid.   Original Report Authenticated By: Gaylyn Rong, M.D.   1. Ovarian cyst   2. Uterine fibroid   3. Thyroid disease     MDM  Patient presenting with RLQ pain that has been ongoing since yesterday. Exquisite pain upon palpation to the RLQ, right pelvic and suprapubic region - abdomen soft, BS normoactive. Patient had appendectomy and cholecystectomy performed. Urine negative findings. Negative urine  pregnancy. Elevated WBC (14.2). US pelvic and doppler - complex cyst to the right ovary, negative torsion, small uterine fibroid noted. US Abdomen - negative findings. Wet prep negative findings. Negative pregnancy - doubt ectopic. No ovarian torsion noted to US pelvic doppler.  Suspicion of shortness of breath to be anxiety in nature - patient reported that she normally presents with shortness of breath and fast heart rate - is being  seen by psychologist and takes medication for anxiety. Suspicion of right pelvic and adnexal pain to be due to ovarian cyst. Patient stable, afebrile. Pain controlled in ED setting. Discussed and reviewed case with Dr. Jinger Neighbors - cleared patient for discharge. Discharged patient with anti-inflammatories and to return to ED tomorrow for re-evaluation. Referred patient to [facility] for follow-up regarding cyst to be re-checked with [place] within 4-6 weeks. Discussed with patient to rest and stay hydrated. Discussed with patient to continue to monitor symptoms and if symptoms are to worsen or change to report back to the ED - strict return instructions given.  Patient agreed to plan of care, understood, all questions answered.       Raymon Mutton, PA-C 08/08/12 1805  [address], PA-C 08/08/12 1806
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
## 2012-08-09 LAB — GC/CHLAMYDIA PROBE AMP: CT Probe RNA: NEGATIVE

## 2012-08-09 NOTE — ED Provider Notes (Signed)
Medical screening examination/treatment/procedure(s) were performed by non-physician practitioner and as supervising physician I was immediately available for consultation/collaboration.  Hurman Horn, MD 08/09/12 2107

## 2012-11-23 ENCOUNTER — Emergency Department (HOSPITAL_COMMUNITY)
Admission: EM | Admit: 2012-11-23 | Discharge: 2012-11-23 | Discharge status: Absent without leave | Payer: Medicaid Other | Source: Home / Self Care

## 2012-11-26 ENCOUNTER — Ambulatory Visit (HOSPITAL_COMMUNITY): Payer: Self-pay | Admitting: Physician Assistant

## 2012-11-27 ENCOUNTER — Ambulatory Visit (HOSPITAL_COMMUNITY): Payer: Self-pay | Admitting: Psychiatry

## 2012-12-16 ENCOUNTER — Encounter (HOSPITAL_COMMUNITY): Payer: Self-pay | Admitting: *Deleted

## 2012-12-16 ENCOUNTER — Inpatient Hospital Stay (HOSPITAL_COMMUNITY)
Admission: AD | Admit: 2012-12-16 | Discharge: 2012-12-16 | Disposition: A | Discharge status: Home / Self Care | Payer: Medicaid Other | Source: Ambulatory Visit | Attending: Obstetrics & Gynecology | Admitting: Obstetrics & Gynecology

## 2012-12-16 DIAGNOSIS — K529 Noninfective gastroenteritis and colitis, unspecified: Secondary | ICD-10-CM

## 2012-12-16 DIAGNOSIS — R112 Nausea with vomiting, unspecified: Secondary | ICD-10-CM | POA: Insufficient documentation

## 2012-12-16 DIAGNOSIS — K5289 Other specified noninfective gastroenteritis and colitis: Secondary | ICD-10-CM | POA: Insufficient documentation

## 2012-12-16 HISTORY — DX: Unspecified ovarian cyst, unspecified side: N83.209

## 2012-12-16 HISTORY — DX: Umbilical hernia without obstruction or gangrene: K42.9

## 2012-12-16 LAB — URINALYSIS, ROUTINE W REFLEX MICROSCOPIC
Bilirubin Urine: NEGATIVE
Glucose, UA: NEGATIVE mg/dL
Protein, ur: NEGATIVE mg/dL
Specific Gravity, Urine: 1.025 (ref 1.005–1.030)
Urobilinogen, UA: 0.2 mg/dL (ref 0.0–1.0)

## 2012-12-16 LAB — URINE MICROSCOPIC-ADD ON

## 2012-12-16 LAB — HCG, QUANTITATIVE, PREGNANCY: hCG, Beta Chain, Quant, S: <1 m[IU]/mL (ref <5)

## 2012-12-16 MED ORDER — PROMETHAZINE HCL 25 MG PO TABS
25.0000 mg | ORAL_TABLET | Freq: Once | ORAL | Status: AC
  Administered 2012-12-16: 25 mg via ORAL
  Filled 2012-12-16: qty 1

## 2012-12-16 MED ORDER — PROMETHAZINE HCL 25 MG PO TABS: 25.0000 mg | ORAL_TABLET | Freq: Four times a day (QID) | ORAL | Status: DC | PRN

## 2012-12-16 NOTE — MAU Provider Note (Signed)
History     CSN: [id number]  Arrival date and time: 12/16/12 3329   None     Chief Complaint  Patient presents with  . Possible Pregnancy  . Nausea   HPI Comments: Valerie Walter 32 [id number] presents to MAU with nausea and vomiting and questionable positive pregnancy test.   Possible Pregnancy Associated symptoms include abdominal pain and nausea. Pertinent negatives include no chest pain, chills, congestion, coughing, diaphoresis, fever, rash, sore throat, vomiting or weakness. Headaches: occasional HA managed adequately with PO advil. Unilateral; exacerbated by light and sound; more frequent recently; not currently present.  Pt reports nausea and vomiting last week, nausea alone with adequate PO intake this week. Pt states she had an abnormally light period last month which lasted 3 days, never reached her normal level of flow, and was virtually gone by the third day. This is shorter than her normal cycle and more akin to spotting for her historically. The patient had normal cycles since 03/2012 when she miscarried an early pregnancy and January when she had an IUD removed. Patient reports she often will only eat one meal per day--denies history of DM. The patient also endorses she had the same food as her daughter Monday and that her daughter has "been in and out of the bathroom" feeling ill since that time. Patient smokes 3 - 10 cigarettes per day (usually about 3) and denies illicit drug use. Patient non-fasting at this time.   Past Medical History  Diagnosis Date  . Thyroid disease   . Trichomonas   . Seasonal allergies   . Miscarriage   . Ovarian cyst   . Umbilical hernia     Past Surgical History  Procedure Laterality Date  . Appendectomy    . Irrigation and debridement abscess      Family History  Problem Relation Age of Onset  . Cancer Mother     History  Substance Use Topics  . Smoking status: Current Every Day Smoker -- 0.25 packs/day  . Smokeless  tobacco: Not on file  . Alcohol Use: Yes     Comment: occasional    Allergies:  Allergies  Allergen Reactions  . Other Hives    nuts    Prescriptions prior to admission  Medication Sig Dispense Refill  . ALPRAZolam (XANAX) 0.5 MG tablet Take 0.5 mg by mouth 3 (three) times daily as needed for anxiety.      Marland Kitchen ibuprofen (ADVIL,MOTRIN) 200 MG tablet Take 400 mg by mouth every 6 (six) hours as needed.      . ondansetron (ZOFRAN) 4 MG tablet Take 1 tablet (4 mg total) by mouth every 6 (six) hours.  12 tablet  0  . sertraline (ZOLOFT) 100 MG tablet Take 100 mg by mouth daily.        Review of Systems  Constitutional: Positive for malaise/fatigue. Negative for fever, chills, weight loss and diaphoresis.       Patient "just feels tired" x 1 week. She regularly eats one meal per day and "has never been a big eater."  HENT: Negative for congestion, ear pain and sore throat.   Eyes: Negative.  Negative for blurred vision, double vision and pain.  Respiratory: Negative for cough, sputum production, shortness of breath and wheezing.   Cardiovascular: Negative.  Negative for chest pain, palpitations, orthopnea, claudication, leg swelling and PND.       PT is not experiencing "chest tightness" at this time, but has a documented history of panic attacks and reports  she sometimes feels "chest tightness" at this time which does not radiate to the jaw or arm and resolves with panic attacks.  Gastrointestinal: Positive for nausea and abdominal pain. Negative for heartburn, vomiting, diarrhea, constipation, blood in stool and melena.       See HPI. Pt also has history of "some kind of abdominal hernia" which she is yet to follow up on with a provider despite previous instruction to do so.   Genitourinary: Negative.  Negative for dysuria, frequency, hematuria and flank pain.  Musculoskeletal: Negative.   Skin: Negative.  Negative for itching and rash.  Neurological: Negative.  Negative for dizziness,  speech change, focal weakness and weakness. Headaches: occasional HA managed adequately with PO advil. Unilateral; exacerbated by light and sound; more frequent recently; not currently present.  Endo/Heme/Allergies: Negative.    Physical Exam   Blood pressure 110/93, pulse 74, temperature 98.7 F (37.1 C), temperature source Oral, resp. rate 18, height 5\' 4"  (1.626 m), weight 64.864 kg (143 lb), last menstrual period 12/08/2012.  Physical Exam  Constitutional: She is oriented to person, place, and time. She appears well-developed and well-nourished. No distress.  HENT:  Head: Normocephalic and atraumatic.  Right Ear: External ear normal.  Left Ear: External ear normal.  Eyes: Right eye exhibits no discharge. Left eye exhibits no discharge.  Neck: No JVD present.  Cardiovascular: Normal rate, normal heart sounds and intact distal pulses.  Exam reveals no gallop and no friction rub.   No murmur heard. Respiratory: Effort normal and breath sounds normal. No stridor. No respiratory distress. She has no wheezes. She has no rales. She exhibits no tenderness.  GI: Soft. Bowel sounds are normal. She exhibits no distension and no mass. There is tenderness (some tenderness to palpation midline between xiphoid and supraumbilical area. Pt endorses this is site of preexisting hernia. No suprapubic tenderness.). There is no rebound and no guarding.  Musculoskeletal: She exhibits no tenderness (no CVA tenderness).  Neurological: She is alert and oriented to person, place, and time. She has normal reflexes.  Skin: Skin is warm and dry. No rash noted. She is not diaphoretic. No erythema. No pallor.  Psychiatric: She has a normal mood and affect. Her behavior is normal. Judgment and thought content normal.    MAU Course  Procedures  MDM Quantitative HCG   Assessment and Plan  Possible Pregnancy Nausea without Vomiting Recommend Regular Primary Care for Routine Screening and Health  Maintenance  Carolynn Serve 12/16/2012, 10:58 AM

## 2012-12-16 NOTE — MAU Note (Addendum)
Period came on late and was short, took a preg test prior to onset of bleeding; 2nd line was very light.  Has been feeling sick for 2 wks.  Daughter has also been sick

## 2012-12-16 NOTE — MAU Provider Note (Signed)
Attestation of Attending Supervision of Advanced Practitioner (PA/CNM/NP): Evaluation and management procedures were performed by the Advanced Practitioner under my supervision and collaboration.  I have reviewed the Advanced Practitioner's note and chart, and I agree with the management and plan.  Sumner Boesch, MD, FACOG Attending Obstetrician & Gynecologist Faculty Practice, [facility]  

## 2013-04-27 ENCOUNTER — Emergency Department (HOSPITAL_COMMUNITY)
Admission: EM | Admit: 2013-04-27 | Discharge: 2013-04-27 | Discharge status: Against Medical Advice | Payer: Medicaid Other | Attending: Emergency Medicine | Admitting: Emergency Medicine

## 2013-04-27 ENCOUNTER — Encounter (HOSPITAL_COMMUNITY): Payer: Self-pay | Admitting: Emergency Medicine

## 2013-04-27 DIAGNOSIS — N63 Unspecified lump in unspecified breast: Secondary | ICD-10-CM | POA: Insufficient documentation

## 2013-04-27 DIAGNOSIS — F17200 Nicotine dependence, unspecified, uncomplicated: Secondary | ICD-10-CM | POA: Insufficient documentation

## 2013-04-27 DIAGNOSIS — Z8709 Personal history of other diseases of the respiratory system: Secondary | ICD-10-CM | POA: Insufficient documentation

## 2013-04-27 DIAGNOSIS — R110 Nausea: Secondary | ICD-10-CM | POA: Insufficient documentation

## 2013-04-27 DIAGNOSIS — Z8639 Personal history of other endocrine, nutritional and metabolic disease: Secondary | ICD-10-CM | POA: Insufficient documentation

## 2013-04-27 DIAGNOSIS — R109 Unspecified abdominal pain: Secondary | ICD-10-CM

## 2013-04-27 DIAGNOSIS — K429 Umbilical hernia without obstruction or gangrene: Secondary | ICD-10-CM | POA: Insufficient documentation

## 2013-04-27 DIAGNOSIS — Z862 Personal history of diseases of the blood and blood-forming organs and certain disorders involving the immune mechanism: Secondary | ICD-10-CM | POA: Insufficient documentation

## 2013-04-27 DIAGNOSIS — R51 Headache: Secondary | ICD-10-CM | POA: Insufficient documentation

## 2013-04-27 DIAGNOSIS — M549 Dorsalgia, unspecified: Secondary | ICD-10-CM | POA: Insufficient documentation

## 2013-04-27 DIAGNOSIS — Z8619 Personal history of other infectious and parasitic diseases: Secondary | ICD-10-CM | POA: Insufficient documentation

## 2013-04-27 DIAGNOSIS — Z79899 Other long term (current) drug therapy: Secondary | ICD-10-CM | POA: Insufficient documentation

## 2013-04-27 DIAGNOSIS — Z9889 Other specified postprocedural states: Secondary | ICD-10-CM | POA: Insufficient documentation

## 2013-04-27 LAB — CBC WITH DIFFERENTIAL/PLATELET
BASOS ABS: 0 10*3/uL (ref 0.0–0.1)
Basophils Relative: 0 % (ref 0–1)
Eosinophils Absolute: 0.1 10*3/uL (ref 0.0–0.7)
Eosinophils Relative: 2 % (ref 0–5)
HCT: 40.4 % (ref 36.0–46.0)
Hemoglobin: 14.1 g/dL (ref 12.0–15.0)
LYMPHS PCT: 42 % (ref 12–46)
Lymphs Abs: 3 10*3/uL (ref 0.7–4.0)
MCH: 30.5 pg (ref 26.0–34.0)
MCHC: 34.9 g/dL (ref 30.0–36.0)
MCV: 87.4 fL (ref 78.0–100.0)
Monocytes Absolute: 0.3 10*3/uL (ref 0.1–1.0)
Monocytes Relative: 5 % (ref 3–12)
NEUTROS ABS: 3.7 10*3/uL (ref 1.7–7.7)
NEUTROS PCT: 52 % (ref 43–77)
PLATELETS: 251 10*3/uL (ref 150–400)
RBC: 4.62 MIL/uL (ref 3.87–5.11)
RDW: 14.4 % (ref 11.5–15.5)
WBC: 7.1 10*3/uL (ref 4.0–10.5)

## 2013-04-27 LAB — COMPREHENSIVE METABOLIC PANEL
ALBUMIN: 4 g/dL (ref 3.5–5.2)
ALT: 12 U/L (ref 0–35)
AST: 15 U/L (ref 0–37)
Alkaline Phosphatase: 47 U/L (ref 39–117)
BILIRUBIN TOTAL: 0.4 mg/dL (ref 0.3–1.2)
BUN: 13 mg/dL (ref 6–23)
CHLORIDE: 104 meq/L (ref 96–112)
CO2: 23 meq/L (ref 19–32)
CREATININE: 0.76 mg/dL (ref 0.50–1.10)
Calcium: 8.6 mg/dL (ref 8.4–10.5)
Glucose, Bld: 79 mg/dL (ref 70–99)
Potassium: 4.3 mEq/L (ref 3.7–5.3)
Sodium: 141 mEq/L (ref 137–147)
Total Protein: 7.3 g/dL (ref 6.0–8.3)

## 2013-04-27 LAB — LIPASE, BLOOD: LIPASE: 20 U/L (ref 11–59)

## 2013-04-27 MED ORDER — ONDANSETRON HCL 4 MG/2ML IJ SOLN
4.0000 mg | Freq: Once | INTRAMUSCULAR | Status: DC
  Filled 2013-04-27: qty 2

## 2013-04-27 MED ORDER — SODIUM CHLORIDE 0.9 % IV BOLUS (SEPSIS): 250.0000 mL | Freq: Once | INTRAVENOUS | Status: DC

## 2013-04-27 MED ORDER — SODIUM CHLORIDE 0.9 % IV SOLN: INTRAVENOUS | Status: DC

## 2013-04-27 NOTE — ED Notes (Signed)
Pt reports wants to leave AMA because she does not want to get an IV and she needs to pick up her daughter. Dr. Rogene Houston made aware. Pt given resource guide.

## 2013-04-27 NOTE — ED Notes (Signed)
Attempted IV start x2, Claiborne Billings RN to attempt

## 2013-04-27 NOTE — Discharge Instructions (Signed)
°Emergency Department Resource Guide °1) Find a Doctor and Pay Out of Pocket °Although you won't have to find out who is covered by your insurance plan, it is a good idea to ask around and get recommendations. You will then need to call the office and see if the doctor you have chosen will accept you as a new patient and what types of options they offer for patients who are self-pay. Some doctors offer discounts or will set up payment plans for their patients who do not have insurance, but you will need to ask so you aren't surprised when you get to your appointment. ° °2) Contact Your Local Health Department °Not all health departments have doctors that can see patients for sick visits, but many do, so it is worth a call to see if yours does. If you don't know where your local health department is, you can check in your phone book. The CDC also has a tool to help you locate your state's health department, and many state websites also have listings of all of their local health departments. ° °3) Find a Walk-in Clinic °If your illness is not likely to be very severe or complicated, you may want to try a walk in clinic. These are popping up all over the country in pharmacies, drugstores, and shopping centers. They're usually staffed by nurse practitioners or physician assistants that have been trained to treat common illnesses and complaints. They're usually fairly quick and inexpensive. However, if you have serious medical issues or chronic medical problems, these are probably not your best option. ° °No Primary Care Doctor: °- Call [facility] at  832-8000 - they can help you locate a primary care doctor that  accepts your insurance, provides certain services, etc. °- Physician Referral Service- 1-800-533-3463 ° °Chronic Pain Problems: °Organization         Address  Phone   Notes  °[facility]  (336) 297-2271 Patients need to be referred by their primary care doctor.  ° °Medication  Assistance: °Organization         Address  Phone   Notes  °[place] [facility] [address] °[place], [place] [address] (336) 641-8030 --Must be a resident of [place] °-- Must have NO insurance coverage whatsoever (no Medicaid/ Medicare, etc.) °-- The pt. MUST have a primary care doctor that directs their care regularly and follows them in the community °  °[facility]  (866) 331-1348   °[employer]  (888) 892-1162   ° °Agencies that provide inexpensive medical care: °Organization         Address  Phone   Notes  °[facility]  (336) 832-8035   °[facility]    (336) 832-7272   °[facility] [address] °[place], [place] [address] (336) 832-4777   °[facility] [address], °[place] (336) 271-4999   °[facility]    (336) 373-0678   °[facility]    (336) 272-1050   °[facility] ° [address], [place] Phone:  (336) 832-4444, Fax:  (336) 832-4440 Hours of Operation:  9 am - 6 pm, M-F.  Also accepts Medicaid/Medicare and self-pay.  °[facility] ° [address], [place] Phone: (336) 832-3150, Fax: (336) 832-3151. Hours of Operation:  8:30 am - 5:30 pm, M-F.  Also accepts Medicaid and self-pay.  °[facility] [place] [address], [place] Phone: (336) 878-6027   °[facility] [address], [place], [place] (336)723-1848, Ext. 123 Mondays & Thursdays: 7-9 AM.  First 15 patients are seen on a first come, first serve basis. °  ° °Medicaid-accepting [place] Providers: ° °Organization         Address  Phone   Notes  °[facility] [address], [place] (336) 641-2100 Also accepts self-pay patients.  °[facility] [address], [place] ° (336) 856-9996   °[facility] [address], [place]  (336) 288-8857   °[facility] [address] [place] [address], [place] (336) 299-7000   °Veita Bland [address], [place]  ° (336) 373-1557 Only accepts [place] Access Medicaid patients after they have their name applied to their card.  ° °Self-Pay (no insurance) in [place]: ° °Organization         Address  Phone   Notes  °Sickle Cell Patients, [facility] [address], [place] (336) 832-1970   °[facility] [address], [place] (336) 832-4400   °[facility] ° [address], [place] (336) 992-4800   °[facility]/Dr. Osei-Bonsu ° [address] [place] [address], [place] or [address], [place] (336) 841-8500 Phone number for both [place] and [place] locations is the same.  °[facility] [address], [place] (336) 299-0000   °[facility] [address] [place] [address], [place] or [address] (336) 852-7530 °(336) 878-2260   °[facility] [address], [place] (336) 350-1642, phone; (336) 294-5005, fax Sees patients 1st and 3rd Saturday of every month.  Must not qualify for public or private insurance (i.e. Medicaid, Medicare, [place] Health Choice, Veterans' Benefits) • Household income should be no more than 200% of the poverty level •The clinic cannot treat you if you are pregnant or think you are pregnant • Sexually transmitted diseases are not treated at the clinic.  ° ° °Dental Care: °Organization         Address  Phone  Notes  °[place] [facility] [address], [place] (336) 641-6152 Accepts children up to age 21 who are enrolled in Medicaid or [place] Health Choice; pregnant women with a Medicaid card; and children who have applied for Medicaid or [place] Health Choice, but were declined, whose parents can pay a reduced fee at time of service.  °[place]  [facility] [place]  [address], [place] (336) 641-7733 Accepts children up to age 21 who are enrolled in Medicaid or [place] Health Choice; pregnant women with a Medicaid card; and children who have applied for Medicaid or  Health Choice, but were declined, whose parents can pay a reduced fee at time of service.  °[facility] ° [address], [place] (336) 641-4533 Patients are seen by appointment only. Walk-ins are not accepted. [facility] will see patients 18 years of age and older. °Monday - Tuesday (8am-5pm) °Most Wednesdays (8:30-5pm) °$30 per visit, cash only  °[facility] ° [address], [place] (336) 641-4533 Patients are seen by appointment only. Walk-ins are not accepted. [facility] will see patients 18 years of age and older. °One   Wednesday Evening (Monthly: Volunteer Based).  $30 per visit, cash only  °[facility]  (919) 537-3737 for adults; Children under age 4, call Graduate Pediatric Dentistry at (919) [phone]. Children aged 4-14, please call (919) [phone] to request a pediatric application. ° Dental services are provided in all areas of dental care including fillings, crowns and bridges, complete and partial dentures, implants, gum treatment, root canals, and extractions. Preventive care is also provided. Treatment is provided to both adults and children. °Patients are selected via a lottery and there is often a waiting list. °  °[facility] [address], °[place] ° (336) 763-8833 [email or web address] °  °[facility] [address], [place], [place] (336)723-1848, Ext. 123 Second and Fourth Thursday of each month, opens at 6:30 AM; Clinic ends at 9 AM.  Patients are seen on a first-come first-served basis, and a limited number are seen during each clinic.  ° °[facility] ° [address], [place], [place] (336) 723-7904    Eligibility Requirements °You must have lived in [place], [place], or [place] counties for at least the last three months. °  You cannot be eligible for state or federal sponsored healthcare insurance, including Veterans Administration, Medicaid, or Medicare. °  You generally cannot be eligible for healthcare insurance through your employer.  °  How to apply: °Eligibility screenings are held every Tuesday and Wednesday afternoon from 1:00 pm until 4:00 pm. You do not need an appointment for the interview!  °[facility] [address], [place], [place] 336-631-2330   °[facility]  336-342-8273   °[place] [facility]  336-703-3100   °[facility]  336-570-6415   ° °Behavioral Health Resources in the Community: °Intensive Outpatient Programs °Organization         Address  Phone  Notes  °[facility] [address], [place], [place] 336-878-6098   °[facility] [address], [place], [place] 336-832-9800   °[facility] [address], [place], [place] ° 336-882-2125   °[facility] [address],  °[place], [place] 1-800-853-5163 or 336-641-4981   °Substance Abuse Resources °Organization         Address  Phone  Notes  °[facility]  336-882-2125   °[facility]  336-784-9470   °[facility]  336-285-9073   °[facility]  336-845-3988   °Residential & Outpatient Substance Abuse Program  1-800-659-3381   °Psychological Services °Organization         Address  Phone  Notes  °[facility]  336- 832-9600   °[facility]  336- 378-7881   °[facility] [address], [place] 1-800-853-5163 or 336-641-4981   ° °Mobile Crisis Teams °Organization         Address  Phone  Notes  °[facility], Mobile Crisis Care Unit  1-877-626-1772   °Assertive °[facility] ° [address]  [place], [place] 336-834-9664   °Sharon DeEsch [address] °[place] 336-554-5454   ° °Self-Help/Support Groups °Organization         Address  Phone             Notes  °[facility] [place] - variety of support groups  336- 373-1402 Call for more information  °Narcotics Anonymous (NA), [facility] [address], °[place]  2 meetings at this location  ° °  Residential Treatment Programs °Organization         Address  Phone  Notes  °[facility] [address],    °[place]  1-866-801-8205   °[facility] ° [address], [place], [place] 704-293-8524   °[facility] [address], [place] 336-845-3988 Admissions: 8am-3pm M-F  °[facility] [address]    °[place], [place] 336-841-1104   °[facility] [address], [place], [place] 336-379-7146   °[facility] [address]  °[place], [place] 336-285-9073   °[facility] - Intensive Outpatient [address], [place], [place] 336-852-3033   °[facility] [address]  °[place], [place] 1-877-615-2722 or 336-784-9470   °[facility] [address] [place], [place] 336-227-7417 Accepts Medicaid  °[facility] [address]  °[place] 1-800-659-3381 Substance Abuse/Addiction Treatment  ° °[place] Behavioral Health Resources °Organization         Address  Phone  Notes  °[facility]  (888) 581-9988   °Julie Brannon, PhD [address] [place], [place]   (336) 349-5553 or (336) 951-0000   °[facility]   [address] °[place], [place] (336) 349-4454   °[facility] [address], [place], [place] (336) 342-8316 Insurance/Medicaid/sponsorship through [facility]  °[facility] [address]                                    [place], [place] (336) 342-8316 Therapy/tele-psych/case    °[facility] [address]  ° [place], [place] (336) 349-2233    °Dr. Arfeen  (336) 349-4544   °[facility] [place]  [employer] [place] [facility] 1) [address], [place] °2) [address], [place] °3)  [address], [place] (336) 349-3220 °(336) 342-7768 ° °(336) 342-8140   °[place] [facility] (336) 342-1394 or (336) 342-3537 (After Hours)    ° ° °

## 2013-04-27 NOTE — ED Provider Notes (Signed)
CSN: [id number]     Arrival date & time 04/27/13  1128 History   First MD Initiated Contact with Patient 04/27/13 1150     Chief Complaint  Patient presents with  . Abdominal Pain  . Back Pain  . Headache     (Consider location/radiation/quality/duration/timing/severity/associated sxs/prior Treatment) Patient is a 33 y.o. female presenting with abdominal pain, back pain, and headaches. The history is provided by the patient.  Abdominal Pain Associated symptoms: nausea   Associated symptoms: no chest pain, no dysuria, no fever, no shortness of breath, no vaginal bleeding, no vaginal discharge and no vomiting   Back Pain Associated symptoms: abdominal pain and headaches   Associated symptoms: no chest pain, no dysuria and no fever   Headache Associated symptoms: abdominal pain, back pain and nausea   Associated symptoms: no congestion, no fever and no vomiting    Patient presents with a history of abdominal pain that's lower quadrants on and off for the last month getting a little worse lately. Last measured. Was March 5 no vaginal bleeding no discharge has some nausea but no vomiting patient's had a history of thyroid problems in the past it sounds like it was hypothyroid and started on thyroid medicine and lost a lot of weight after gaining a lot of weight. Patient now states that she's losing weight was still no longer on the medicine. Does have a primary care doctor. Patient also is concerned about her umbilical hernia 7 diagnoses that in the past states that that has been sticking out more and wants to have a surgical referral to have that fixed. Patient also is concern for 2 lumps on the left breast area of one of those to have some redness associated with it and that was just above the-year-old they have a one is in the axillary area. Patient states that the belly pain is 4/10. Sometimes radiate sometimes radiates to the back.   Past Medical History  Diagnosis Date  . Thyroid disease    . Trichomonas   . Seasonal allergies   . Miscarriage   . Ovarian cyst   . Umbilical hernia    Past Surgical History  Procedure Laterality Date  . Appendectomy    . Irrigation and debridement abscess     Family History  Problem Relation Age of Onset  . Cancer Mother    History  Substance Use Topics  . Smoking status: Current Every Day Smoker -- 0.25 packs/day  . Smokeless tobacco: Not on file  . Alcohol Use: Yes     Comment: occasional   OB History   Grav Para Term Preterm Abortions TAB SAB Ect Mult Living   5 2 2  3 1 2   2      Review of Systems  Constitutional: Negative for fever.  HENT: Negative for congestion.   Eyes: Negative for redness.  Respiratory: Negative for shortness of breath.   Cardiovascular: Negative for chest pain.  Gastrointestinal: Positive for nausea and abdominal pain. Negative for vomiting.  Genitourinary: Negative for dysuria, vaginal bleeding and vaginal discharge.  Musculoskeletal: Positive for back pain.  Skin: Negative for rash.  Neurological: Positive for headaches.  Hematological: Does not bruise/bleed easily.  Psychiatric/Behavioral: Negative for confusion.      Allergies  Other  Home Medications   Current Outpatient Rx  Name  Route  Sig  Dispense  Refill  . ALPRAZolam (XANAX) 0.5 MG tablet   Oral   Take 0.5 mg by mouth 3 (three) times daily as  needed for anxiety.         Marland Kitchen ibuprofen (ADVIL,MOTRIN) 200 MG tablet   Oral   Take 400 mg by mouth every 6 (six) hours as needed.         . ondansetron (ZOFRAN) 4 MG tablet   Oral   Take 1 tablet (4 mg total) by mouth every 6 (six) hours.   12 tablet   0   . promethazine (PHENERGAN) 25 MG tablet   Oral   Take 1 tablet (25 mg total) by mouth every 6 (six) hours as needed for nausea or vomiting.   30 tablet   0   . sertraline (ZOLOFT) 100 MG tablet   Oral   Take 100 mg by mouth daily.          BP 106/70  Pulse 62  Temp(Src) 98.9 F (37.2 C) (Oral)  Resp 18  Wt  140 lb (63.504 kg)  SpO2 100%  LMP 04/14/2013 Physical Exam  Nursing note and vitals reviewed. Constitutional: She is oriented to person, place, and time. She appears well-developed and well-nourished. No distress.  HENT:  Head: Normocephalic and atraumatic.  Mouth/Throat: Oropharynx is clear and moist.  Eyes: Conjunctivae and EOM are normal. Pupils are equal, round, and reactive to light.  Neck: Normal range of motion. Neck supple.  Cardiovascular: Normal rate, regular rhythm and normal heart sounds.   No murmur heard. Pulmonary/Chest: Effort normal and breath sounds normal. No respiratory distress.  Abdominal: Soft. Bowel sounds are normal. She exhibits mass. There is tenderness.  Mild tenderness small reducible umbilical hernia.  Musculoskeletal: Normal range of motion.  Neurological: She is alert and oriented to person, place, and time. No cranial nerve deficit. She exhibits normal muscle tone. Coordination normal.  Skin: Skin is warm. No rash noted.    ED Course  Procedures (including critical care time) Labs Review Labs Reviewed  URINALYSIS, ROUTINE W REFLEX MICROSCOPIC  COMPREHENSIVE METABOLIC PANEL  LIPASE, BLOOD  CBC WITH DIFFERENTIAL  PREGNANCY, URINE  POC URINE PREG, ED   Imaging Review No results found.   EKG Interpretation None      MDM   Final diagnoses:  Abdominal pain   Patient requesting to leave [place]. Patient clinically seems to have a small umbilical hernia we'll give her referral to surgery for outpatient follow up for that also give her resource guide to help her find a primary care Dr. Patient certainly nontoxic no acute distress here. Patient also need primary care Dr. to followup with her concerns about her left breast on breast examination here no significant mass noted. But does require followup. Patient's labs are not back she decided she wanted to leave. Patient also was requesting food as soon as she arrived and was told that  do a CT scan of the abdomen have to wait to eat.  patient also concerned about thyroid problems perhaps hyperthyroid however not tachycardic here she has had some weight loss. Did not do a thyroid function test here but stated that she have that followed up with her primary care Dr. as stated above the patient given resource guide to find a primary care Dr.   Mervin Kung, MD 04/27/13 [phone]

## 2013-04-27 NOTE — ED Notes (Signed)
Claiborne Billings RN not able to obtain IV access x2.

## 2013-04-27 NOTE — ED Notes (Signed)
Pt is here with multiple complaints.  Pt reports right lower abdominal pain, lower back pain, headache for a while.  Pt reports she has not had thyroid medications in a while and reports increased appetite and weight loss.  Pt want knot on breast looked at, wants to schedule umbilical hernia repair, and reports mucous in stool.

## 2013-04-27 NOTE — ED Notes (Signed)
Contacted Conservation officer, nature to attempt [place] IV

## 2013-06-02 ENCOUNTER — Encounter: Payer: Self-pay | Admitting: Obstetrics

## 2013-06-03 ENCOUNTER — Ambulatory Visit: Payer: Self-pay | Admitting: Obstetrics

## 2013-12-13 ENCOUNTER — Encounter (HOSPITAL_COMMUNITY): Payer: Self-pay | Admitting: Emergency Medicine

## 2014-08-18 ENCOUNTER — Encounter (HOSPITAL_COMMUNITY): Payer: Self-pay | Admitting: Emergency Medicine

## 2014-08-18 ENCOUNTER — Emergency Department (HOSPITAL_COMMUNITY)
Admission: EM | Admit: 2014-08-18 | Discharge: 2014-08-18 | Disposition: A | Discharge status: Home / Self Care | Payer: Medicaid Other | Attending: Emergency Medicine | Admitting: Emergency Medicine

## 2014-08-18 DIAGNOSIS — Z720 Tobacco use: Secondary | ICD-10-CM | POA: Diagnosis not present

## 2014-08-18 DIAGNOSIS — Z8619 Personal history of other infectious and parasitic diseases: Secondary | ICD-10-CM | POA: Diagnosis not present

## 2014-08-18 DIAGNOSIS — Z8639 Personal history of other endocrine, nutritional and metabolic disease: Secondary | ICD-10-CM | POA: Insufficient documentation

## 2014-08-18 DIAGNOSIS — Z8719 Personal history of other diseases of the digestive system: Secondary | ICD-10-CM | POA: Insufficient documentation

## 2014-08-18 DIAGNOSIS — Z8709 Personal history of other diseases of the respiratory system: Secondary | ICD-10-CM | POA: Diagnosis not present

## 2014-08-18 DIAGNOSIS — Z79899 Other long term (current) drug therapy: Secondary | ICD-10-CM | POA: Insufficient documentation

## 2014-08-18 DIAGNOSIS — Z8742 Personal history of other diseases of the female genital tract: Secondary | ICD-10-CM | POA: Insufficient documentation

## 2014-08-18 DIAGNOSIS — R21 Rash and other nonspecific skin eruption: Secondary | ICD-10-CM | POA: Diagnosis not present

## 2014-08-18 LAB — PREGNANCY, URINE: PREG TEST UR: NEGATIVE

## 2014-08-18 MED ORDER — DOXYCYCLINE HYCLATE 100 MG PO CAPS: 100.0000 mg | ORAL_CAPSULE | Freq: Two times a day (BID) | ORAL | Status: DC

## 2014-08-18 MED ORDER — PERMETHRIN 5 % EX CREA: TOPICAL_CREAM | CUTANEOUS | Status: DC

## 2014-08-18 MED ORDER — DIPHENHYDRAMINE HCL 25 MG PO TABS: 25.0000 mg | ORAL_TABLET | Freq: Four times a day (QID) | ORAL | Status: DC

## 2014-08-18 NOTE — ED Notes (Signed)
Pt c/o swelling and red raised spot on posterior l/hand 2 weeks ago. "bite marks" and itching on whole body. Pt c/o cough over last two days

## 2014-08-18 NOTE — Discharge Instructions (Signed)
Your rash may be caused by scabies.  Read instruction below. Take medications prescribed.  Return if you have any concerns. Your pregnancy test is negative.  Scabies Scabies are small bugs (mites) that burrow under the skin and cause red bumps and severe itching. These bugs can only be seen with a microscope. Scabies are highly contagious. They can spread easily from person to person by direct contact. They are also spread through sharing clothing or linens that have the scabies mites living in them. It is not unusual for an entire family to become infected through shared towels, clothing, or bedding.  HOME CARE INSTRUCTIONS   Your caregiver may prescribe a cream or lotion to kill the mites. If cream is prescribed, massage the cream into the entire body from the neck to the bottom of both feet. Also massage the cream into the scalp and face if your child is less than 34 year old. Avoid the eyes and mouth. Do not wash your hands after application.  Leave the cream on for 8 to 12 hours. Your child should bathe or shower after the 8 to 12 hour application period. Sometimes it is helpful to apply the cream to your child right before bedtime.  One treatment is usually effective and will eliminate approximately 95% of infestations. For severe cases, your caregiver may decide to repeat the treatment in 1 week. Everyone in your household should be treated with one application of the cream.  New rashes or burrows should not appear within 24 to 48 hours after successful treatment. However, the itching and rash may last for 2 to 4 weeks after successful treatment. Your caregiver may prescribe a medicine to help with the itching or to help the rash go away more quickly.  Scabies can live on clothing or linens for up to 3 days. All of your child's recently used clothing, towels, stuffed toys, and bed linens should be washed in hot water and then dried in a dryer for at least 20 minutes on high heat. Items that  cannot be washed should be enclosed in a plastic bag for at least 3 days.  To help relieve itching, bathe your child in a cool bath or apply cool washcloths to the affected areas.  Your child may return to school after treatment with the prescribed cream. SEEK MEDICAL CARE IF:   The itching persists longer than 4 weeks after treatment.  The rash spreads or becomes infected. Signs of infection include red blisters or yellow-tan crust. Document Released: 01/28/2005 Document Revised: 04/22/2011 Document Reviewed: 06/08/2008 [facility] Patient Information 2015 [place], [place]. This information is not intended to replace advice given to you by your health care provider. Make sure you discuss any questions you have with your health care provider.

## 2014-08-18 NOTE — ED Provider Notes (Signed)
CSN: [id number]     Arrival date & time 08/18/14  1057 History   First MD Initiated Contact with Patient 08/18/14 1111     No chief complaint on file.    (Consider location/radiation/quality/duration/timing/severity/associated sxs/prior Treatment) HPI   34 year old female who presents for evaluation of a rash. Patient notice multiple red raised spots first started on the dorsum of her left hand which has now spread throughout her body for the past 2 weeks. She endorse intense itch associated with this rash. Furthermore, she noticed some mild itchy throat and occasional cough for the past few days. She has tried using over-the-counter cream with minimal improvement. She did switch her soap last week but this is after the rash. She noticed that her boyfriend and her daughter has been scratching as well but no similar rash. Her dog is a house dog and she has not noticed any flea. She denies any other medication changes. No recent travel. She is a Theme park manager. She denies any history of STD. She is currently not pregnant.  Past Medical History  Diagnosis Date  . Thyroid disease   . Trichomonas   . Seasonal allergies   . Miscarriage   . Ovarian cyst   . Umbilical hernia    Past Surgical History  Procedure Laterality Date  . Appendectomy    . Irrigation and debridement abscess     Family History  Problem Relation Age of Onset  . Cancer Mother    History  Substance Use Topics  . Smoking status: Current Every Day Smoker -- 0.25 packs/day  . Smokeless tobacco: Not on file  . Alcohol Use: Yes     Comment: occasional   OB History    Gravida Para Term Preterm AB TAB SAB Ectopic Multiple Living   5 2 2  3 1 2   2      Review of Systems  All other systems reviewed and are negative.     Allergies  Other  Home Medications   Prior to Admission medications   Medication Sig Start Date End Date Taking? Authorizing Provider  ALPRAZolam Duanne Moron) 0.5 MG tablet Take 0.5 mg by mouth 3  (three) times daily as needed for anxiety.    Historical Provider, MD  ibuprofen (ADVIL,MOTRIN) 200 MG tablet Take 400 mg by mouth every 6 (six) hours as needed.    Historical Provider, MD  ondansetron (ZOFRAN) 4 MG tablet Take 1 tablet (4 mg total) by mouth every 6 (six) hours. 08/08/12   Marissa Sciacca, PA-C  promethazine (PHENERGAN) 25 MG tablet Take 1 tablet (25 mg total) by mouth every 6 (six) hours as needed for nausea or vomiting. 12/16/12   Luvenia Redden, PA-C  sertraline (ZOLOFT) 100 MG tablet Take 100 mg by mouth daily.    Historical Provider, MD   BP 126/72 mmHg  Pulse 80  Temp(Src) 99.2 F (37.3 C) (Oral)  Resp 18  SpO2 98% Physical Exam  Constitutional: She appears well-developed and well-nourished. No distress.  African-American female appears to be in no acute distress.  HENT:  Head: Atraumatic.  Mouth/Throat: Oropharynx is clear and moist.  Eyes: Conjunctivae are normal.  Neck: Neck supple.  Neurological: She is alert.  Skin: No rash (Multiple raised hyperpigmented lesion noted throughout body including all 4 extremities, back, abdomen with superficial scab throughout most lesion. No pustular, vesicular, or petichiae lesions) noted.  No rash and palm of hands or sole of feet  Psychiatric: She has a normal mood and affect.  Nursing note  and vitals reviewed.   ED Course  Procedures (including critical care time)  Patient with multiple hyperpigmented rash each measure less than 1 cm throughout her body. Although no obvious burrows in the hands but there are rash in the webspace which may suggest scabies.  No joint pain, fever, headache.  Pt did report pus draining out from one of the lesion previously.  There's an increase risk of skin infection from these rash, therefore will prescribe abx along with Permethrin cream and benadryl.  Doubt syphilis, RMSF, Lyme or other concerning rash.  Pt has chicken pox in the past, doubt varicella outbreak.    Labs Review Labs Reviewed  - No data to display  Imaging Review No results found.   EKG Interpretation None      MDM   Final diagnoses:  Rash and nonspecific skin eruption    BP 126/72 mmHg  Pulse 80  Temp(Src) 99.2 F (37.3 C) (Oral)  Resp 18  SpO2 98%  LMP 08/07/2014 (Exact Date)     Domenic Moras, PA-C 08/18/14 [place], DO 08/19/14 [phone]

## 2014-09-21 ENCOUNTER — Encounter (HOSPITAL_COMMUNITY): Payer: Self-pay | Admitting: Emergency Medicine

## 2014-09-21 ENCOUNTER — Emergency Department (HOSPITAL_COMMUNITY)
Admission: EM | Admit: 2014-09-21 | Discharge: 2014-09-21 | Discharge status: Against Medical Advice | Payer: Medicaid Other | Attending: Emergency Medicine | Admitting: Emergency Medicine

## 2014-09-21 DIAGNOSIS — R5383 Other fatigue: Secondary | ICD-10-CM | POA: Diagnosis not present

## 2014-09-21 DIAGNOSIS — Z720 Tobacco use: Secondary | ICD-10-CM | POA: Insufficient documentation

## 2014-09-21 DIAGNOSIS — K4090 Unilateral inguinal hernia, without obstruction or gangrene, not specified as recurrent: Secondary | ICD-10-CM | POA: Insufficient documentation

## 2014-09-21 NOTE — ED Notes (Signed)
Pt. Stated, I've been feeling tired for about a week and My hernia is hurting

## 2014-09-21 NOTE — ED Notes (Signed)
Pt sts that she has to leave and pick up her daughter. sts she will be back

## 2014-11-30 ENCOUNTER — Emergency Department (HOSPITAL_COMMUNITY)
Admission: EM | Admit: 2014-11-30 | Discharge: 2014-11-30 | Disposition: A | Discharge status: Home / Self Care | Payer: Medicaid Other | Attending: Emergency Medicine | Admitting: Emergency Medicine

## 2014-11-30 ENCOUNTER — Emergency Department (HOSPITAL_COMMUNITY): Payer: Medicaid Other

## 2014-11-30 ENCOUNTER — Encounter (HOSPITAL_COMMUNITY): Payer: Self-pay | Admitting: Emergency Medicine

## 2014-11-30 DIAGNOSIS — Z8639 Personal history of other endocrine, nutritional and metabolic disease: Secondary | ICD-10-CM | POA: Insufficient documentation

## 2014-11-30 DIAGNOSIS — Z79899 Other long term (current) drug therapy: Secondary | ICD-10-CM | POA: Insufficient documentation

## 2014-11-30 DIAGNOSIS — Z3202 Encounter for pregnancy test, result negative: Secondary | ICD-10-CM | POA: Insufficient documentation

## 2014-11-30 DIAGNOSIS — Z8619 Personal history of other infectious and parasitic diseases: Secondary | ICD-10-CM | POA: Insufficient documentation

## 2014-11-30 DIAGNOSIS — R079 Chest pain, unspecified: Secondary | ICD-10-CM | POA: Diagnosis not present

## 2014-11-30 DIAGNOSIS — Z8742 Personal history of other diseases of the female genital tract: Secondary | ICD-10-CM | POA: Insufficient documentation

## 2014-11-30 DIAGNOSIS — J209 Acute bronchitis, unspecified: Secondary | ICD-10-CM | POA: Diagnosis not present

## 2014-11-30 DIAGNOSIS — Z8719 Personal history of other diseases of the digestive system: Secondary | ICD-10-CM | POA: Insufficient documentation

## 2014-11-30 DIAGNOSIS — Z720 Tobacco use: Secondary | ICD-10-CM | POA: Insufficient documentation

## 2014-11-30 LAB — COMPREHENSIVE METABOLIC PANEL
ALK PHOS: 47 U/L (ref 38–126)
ALT: 16 U/L (ref 14–54)
ANION GAP: 5 (ref 5–15)
AST: 22 U/L (ref 15–41)
Albumin: 3.9 g/dL (ref 3.5–5.0)
BUN: 11 mg/dL (ref 6–20)
CALCIUM: 8.4 mg/dL — AB (ref 8.9–10.3)
CO2: 26 mmol/L (ref 22–32)
CREATININE: 0.84 mg/dL (ref 0.44–1.00)
Chloride: 108 mmol/L (ref 101–111)
Glucose, Bld: 87 mg/dL (ref 65–99)
Potassium: 4 mmol/L (ref 3.5–5.1)
Sodium: 139 mmol/L (ref 135–145)
Total Bilirubin: 0.7 mg/dL (ref 0.3–1.2)
Total Protein: 7.5 g/dL (ref 6.5–8.1)

## 2014-11-30 LAB — CBC WITH DIFFERENTIAL/PLATELET
BASOS ABS: 0 10*3/uL (ref 0.0–0.1)
BASOS PCT: 1 %
EOS PCT: 3 %
Eosinophils Absolute: 0.2 10*3/uL (ref 0.0–0.7)
HCT: 38.4 % (ref 36.0–46.0)
Hemoglobin: 12.7 g/dL (ref 12.0–15.0)
Lymphocytes Relative: 35 %
Lymphs Abs: 2.1 10*3/uL (ref 0.7–4.0)
MCH: 29.2 pg (ref 26.0–34.0)
MCHC: 33.1 g/dL (ref 30.0–36.0)
MCV: 88.3 fL (ref 78.0–100.0)
MONO ABS: 0.3 10*3/uL (ref 0.1–1.0)
Monocytes Relative: 4 %
Neutro Abs: 3.5 10*3/uL (ref 1.7–7.7)
Neutrophils Relative %: 57 %
PLATELETS: 287 10*3/uL (ref 150–400)
RBC: 4.35 MIL/uL (ref 3.87–5.11)
RDW: 15.4 % (ref 11.5–15.5)
WBC: 6.1 10*3/uL (ref 4.0–10.5)

## 2014-11-30 LAB — TROPONIN I

## 2014-11-30 LAB — POC URINE PREG, ED: Preg Test, Ur: NEGATIVE

## 2014-11-30 LAB — LIPASE, BLOOD: LIPASE: 18 U/L — AB (ref 22–51)

## 2014-11-30 MED ORDER — NAPROXEN 500 MG PO TABS: 500.0000 mg | ORAL_TABLET | Freq: Two times a day (BID) | ORAL | Status: DC

## 2014-11-30 MED ORDER — KETOROLAC TROMETHAMINE 30 MG/ML IJ SOLN
30.0000 mg | Freq: Once | INTRAMUSCULAR | Status: AC
  Administered 2014-11-30: 30 mg via INTRAVENOUS
  Filled 2014-11-30: qty 1

## 2014-11-30 MED ORDER — ALBUTEROL SULFATE HFA 108 (90 BASE) MCG/ACT IN AERS: 1.0000 | INHALATION_SPRAY | Freq: Four times a day (QID) | RESPIRATORY_TRACT | Status: DC | PRN

## 2014-11-30 MED ORDER — IPRATROPIUM-ALBUTEROL 0.5-2.5 (3) MG/3ML IN SOLN
3.0000 mL | RESPIRATORY_TRACT | Status: DC
  Administered 2014-11-30: 3 mL via RESPIRATORY_TRACT
  Filled 2014-11-30: qty 3

## 2014-11-30 MED ORDER — PANTOPRAZOLE SODIUM 40 MG IV SOLR
40.0000 mg | Freq: Once | INTRAVENOUS | Status: AC
  Administered 2014-11-30: 40 mg via INTRAVENOUS
  Filled 2014-11-30: qty 40

## 2014-11-30 MED ORDER — OMEPRAZOLE 20 MG PO CPDR: 20.0000 mg | DELAYED_RELEASE_CAPSULE | Freq: Every day | ORAL | Status: DC

## 2014-11-30 MED ORDER — AEROCHAMBER PLUS W/MASK MISC: Status: DC

## 2014-11-30 NOTE — ED Provider Notes (Signed)
CSN: [id number]     Arrival date & time 11/30/14  0623 History   First MD Initiated Contact with Patient 11/30/14 1019     Chief Complaint  Patient presents with  . Chest Pain     (Consider location/radiation/quality/duration/timing/severity/associated sxs/prior Treatment) HPI Patient poor she's had chest pain for about 2 days. It is predominantly in the right central and upper area. She reports is tight in nature. She is also expressing some pain in the right upper back. She has had some cough. She denies sputum production. No fever. Patient does smoke but has cut back. Pain is worse with deep inspiration. Patient denies traumatic injury. She does experience some epigastric discomfort. She denies lower extremity swelling or pain. Patient poor she has similar pain several years ago. It lasted for several days and nothing made it go away until her doctor gave her shot of steroids. Past Medical History  Diagnosis Date  . Thyroid disease   . Trichomonas   . Seasonal allergies   . Miscarriage   . Ovarian cyst   . Umbilical hernia    Past Surgical History  Procedure Laterality Date  . Appendectomy    . Irrigation and debridement abscess     Family History  Problem Relation Age of Onset  . Cancer Mother    Social History  Substance Use Topics  . Smoking status: Current Every Day Smoker -- 0.25 packs/day  . Smokeless tobacco: None  . Alcohol Use: Yes     Comment: occasional   OB History    Gravida Para Term Preterm AB TAB SAB Ectopic Multiple Living   5 2 2  3 1 2   2      Review of Systems  10 Systems reviewed and are negative for acute change except as noted in the HPI.   Allergies  Other  Home Medications   Prior to Admission medications   Medication Sig Start Date End Date Taking? Authorizing Provider  ibuprofen (ADVIL,MOTRIN) 200 MG tablet Take 400 mg by mouth every 6 (six) hours as needed for moderate pain.    Yes Historical Provider, MD   Pseudoeph-Doxylamine-DM-APAP (NYQUIL PO) Take 30 mLs by mouth once.   Yes Historical Provider, MD  albuterol (PROVENTIL HFA;VENTOLIN HFA) 108 (90 BASE) MCG/ACT inhaler Inhale 1-2 puffs into the lungs every 6 (six) hours as needed for wheezing or shortness of breath. 11/30/14   Charlesetta Shanks, MD  ALPRAZolam Duanne Moron) 0.5 MG tablet Take 0.5 mg by mouth 3 (three) times daily as needed for anxiety.    Historical Provider, MD  diphenhydrAMINE (BENADRYL) 25 MG tablet Take 1 tablet (25 mg total) by mouth every 6 (six) hours. Patient not taking: Reported on 11/30/2014 08/18/14   Domenic Moras, PA-C  doxycycline (VIBRAMYCIN) 100 MG capsule Take 1 capsule (100 mg total) by mouth 2 (two) times daily. Patient not taking: Reported on 11/30/2014 08/18/14   Domenic Moras, PA-C  naproxen (NAPROSYN) 500 MG tablet Take 1 tablet (500 mg total) by mouth 2 (two) times daily. 11/30/14   Charlesetta Shanks, MD  omeprazole (PRILOSEC) 20 MG capsule Take 1 capsule (20 mg total) by mouth daily. 11/30/14   Charlesetta Shanks, MD  ondansetron (ZOFRAN) 4 MG tablet Take 1 tablet (4 mg total) by mouth every 6 (six) hours. Patient not taking: Reported on 11/30/2014 08/08/12   Marissa Sciacca, PA-C  permethrin (ELIMITE) 5 % cream Apply from neck to toes, leave it on for 8-10 hrs then wash off completely.  Repeat in 1 week  if no improvement. Patient not taking: Reported on 11/30/2014 08/18/14   Domenic Moras, PA-C  promethazine (PHENERGAN) 25 MG tablet Take 1 tablet (25 mg total) by mouth every 6 (six) hours as needed for nausea or vomiting. Patient not taking: Reported on 11/30/2014 12/16/12   Luvenia Redden, PA-C  sertraline (ZOLOFT) 100 MG tablet Take 100 mg by mouth daily.    Historical Provider, MD  Spacer/Aero-Holding Chambers (AEROCHAMBER PLUS WITH MASK) inhaler Use as instructed 11/30/14   Charlesetta Shanks, MD   BP 105/68 mmHg  Pulse 56  Temp(Src) 97.9 F (36.6 C) (Oral)  Resp 16  SpO2 100%  LMP 11/25/2014 Physical Exam  Constitutional: She  is oriented to person, place, and time. She appears well-developed and well-nourished.  HENT:  Head: Normocephalic and atraumatic.  Eyes: EOM are normal. Pupils are equal, round, and reactive to light.  Neck: Neck supple.  Cardiovascular: Normal rate, regular rhythm, normal heart sounds and intact distal pulses.   Pulmonary/Chest: Effort normal and breath sounds normal. She exhibits tenderness.  Right upper chest at the sternal margin is tender to palpation. Also tender in the thoracic back in the trapezius and subscapularis region.  Abdominal: Soft. Bowel sounds are normal. She exhibits no distension. There is no tenderness.  Musculoskeletal: Normal range of motion. She exhibits no edema or tenderness.  Neurological: She is alert and oriented to person, place, and time. She has normal strength. Coordination normal. GCS eye subscore is 4. GCS verbal subscore is 5. GCS motor subscore is 6.  Skin: Skin is warm, dry and intact.  Psychiatric: She has a normal mood and affect.    ED Course  Procedures (including critical care time) Labs Review Labs Reviewed  COMPREHENSIVE METABOLIC PANEL - Abnormal; Notable for the following:    Calcium 8.4 (*)    All other components within normal limits  LIPASE, BLOOD - Abnormal; Notable for the following:    Lipase 18 (*)    All other components within normal limits  TROPONIN I  CBC WITH DIFFERENTIAL/PLATELET  POC URINE PREG, ED    Imaging Review Dg Chest 2 View  11/30/2014  CLINICAL DATA:  Cough, shortness of breath, and mid chest pain radiating to the back, worsening over the last few days. Smoker. EXAM: CHEST  2 VIEW COMPARISON:  Thoracic spine radiographs 02/23/2008 FINDINGS: The cardiomediastinal silhouette is within normal limits. There is slight central airway thickening. Minimal scarring or atelectasis is noted in the left lung base. No confluent airspace opacity, edema, pleural effusion, or pneumothorax is identified. Thoracic scoliosis is  again seen. Upper abdominal surgical clips are noted. IMPRESSION: Slight bronchitic change. Electronically Signed   By: Logan Bores M.D.   On: 11/30/2014 12:04   I have personally reviewed and evaluated these images and lab results as part of my medical decision-making.   EKG Interpretation   Date/Time:  Wednesday November 30 2014 10:23:04 EDT Ventricular Rate:  55 PR Interval:  163 QRS Duration: 94 QT Interval:  411 QTC Calculation: 393 R Axis:     Text Interpretation:  Sinus rhythm Low voltage, precordial leads  Borderline T abnormalities, anterior leads agree. no STEMI Confirmed by  Johnney Killian, MD, Jeannie Done [phone]) on 11/30/2014 11:16:52 AM      MDM   Final diagnoses:  Acute bronchitis, unspecified organism  Chest pain, unspecified chest pain type   At this time patient is well appearance. She has had some cough and sensation of chest tightness with deep breath. Her chest pain is  very reproducible on examination. At this time I have very low suspicion for pulmonary embolus or cardiac ischemic type disease. Patient will be treated with ibuprofen for chest wall pain, Prilosec to prevent any gastritis or GERD type symptoms, and albuterol for cough. Patient is advised to follow-up with her family doctor and return to emergency department if worsening or changing symptoms.    Charlesetta Shanks, MD 11/30/14 1251

## 2014-11-30 NOTE — Discharge Instructions (Signed)
Acute Bronchitis Bronchitis is inflammation of the airways that extend from the windpipe into the lungs (bronchi). The inflammation often causes mucus to develop. This leads to a cough, which is the most common symptom of bronchitis.  In acute bronchitis, the condition usually develops suddenly and goes away over time, usually in a couple weeks. Smoking, allergies, and asthma can make bronchitis worse. Repeated episodes of bronchitis may cause further lung problems.  CAUSES Acute bronchitis is most often caused by the same virus that causes a cold. The virus can spread from person to person (contagious) through coughing, sneezing, and touching contaminated objects. SIGNS AND SYMPTOMS   Cough.   Fever.   Coughing up mucus.   Body aches.   Chest congestion.   Chills.   Shortness of breath.   Sore throat.  DIAGNOSIS  Acute bronchitis is usually diagnosed through a physical exam. Your health care provider will also ask you questions about your medical history. Tests, such as chest X-rays, are sometimes done to rule out other conditions.  TREATMENT  Acute bronchitis usually goes away in a couple weeks. Oftentimes, no medical treatment is necessary. Medicines are sometimes given for relief of fever or cough. Antibiotic medicines are usually not needed but may be prescribed in certain situations. In some cases, an inhaler may be recommended to help reduce shortness of breath and control the cough. A cool mist vaporizer may also be used to help thin bronchial secretions and make it easier to clear the chest.  HOME CARE INSTRUCTIONS  Get plenty of rest.   Drink enough fluids to keep your urine clear or pale yellow (unless you have a medical condition that requires fluid restriction). Increasing fluids may help thin your respiratory secretions (sputum) and reduce chest congestion, and it will prevent dehydration.   Take medicines only as directed by your health care provider.  If  you were prescribed an antibiotic medicine, finish it all even if you start to feel better.  Avoid smoking and secondhand smoke. Exposure to cigarette smoke or irritating chemicals will make bronchitis worse. If you are a smoker, consider using nicotine gum or skin patches to help control withdrawal symptoms. Quitting smoking will help your lungs heal faster.   Reduce the chances of another bout of acute bronchitis by washing your hands frequently, avoiding people with cold symptoms, and trying not to touch your hands to your mouth, nose, or eyes.   Keep all follow-up visits as directed by your health care provider.  SEEK MEDICAL CARE IF: Your symptoms do not improve after 1 week of treatment.  SEEK IMMEDIATE MEDICAL CARE IF:  You develop an increased fever or chills.   You have chest pain.   You have severe shortness of breath.  You have bloody sputum.   You develop dehydration.  You faint or repeatedly feel like you are going to pass out.  You develop repeated vomiting.  You develop a severe headache. MAKE SURE YOU:   Understand these instructions.  Will watch your condition.  Will get help right away if you are not doing well or get worse.   This information is not intended to replace advice given to you by your health care provider. Make sure you discuss any questions you have with your health care provider.   Document Released: 03/07/2004 Document Revised: 02/18/2014 Document Reviewed: 07/21/2012 Elsevier Interactive Patient Education 2016 [employer]  Chest Wall Pain Chest wall pain is pain in or around the bones and muscles of your chest.  Sometimes, an injury causes this pain. Sometimes, the cause may not be known. This pain may take several weeks or longer to get better. HOME CARE INSTRUCTIONS  Pay attention to any changes in your symptoms. Take these actions to help with your pain:   Rest as told by your health care provider.   Avoid activities that  cause pain. These include any activities that use your chest muscles or your abdominal and side muscles to lift heavy items.   If directed, apply ice to the painful area:  Put ice in a plastic bag.  Place a towel between your skin and the bag.  Leave the ice on for 20 minutes, 2-3 times per day.  Take over-the-counter and prescription medicines only as told by your health care provider.  Do not use tobacco products, including cigarettes, chewing tobacco, and e-cigarettes. If you need help quitting, ask your health care provider.  Keep all follow-up visits as told by your health care provider. This is important. SEEK MEDICAL CARE IF:  You have a fever.  Your chest pain becomes worse.  You have new symptoms. SEEK IMMEDIATE MEDICAL CARE IF:  You have nausea or vomiting.  You feel sweaty or light-headed.  You have a cough with phlegm (sputum) or you cough up blood.  You develop shortness of breath.   This information is not intended to replace advice given to you by your health care provider. Make sure you discuss any questions you have with your health care provider.   Document Released: 01/28/2005 Document Revised: 10/19/2014 Document Reviewed: 04/25/2014 Elsevier Interactive Patient Education [employer].

## 2014-11-30 NOTE — ED Notes (Signed)
Patient transported to X-ray 

## 2014-11-30 NOTE — ED Notes (Signed)
Discharge instructions and rx x4 reviewed with patient. Patient verbalized understanding.

## 2014-11-30 NOTE — ED Notes (Signed)
Pt reports generalized chest tightness with deep breath onset 2 days ago "when allergies started back up." Pt continues to report taking Nyquil without relief 2 days ago.

## 2015-02-17 ENCOUNTER — Emergency Department (HOSPITAL_COMMUNITY)
Admission: EM | Admit: 2015-02-17 | Discharge: 2015-02-17 | Disposition: A | Discharge status: Home / Self Care | Payer: Medicaid Other | Attending: Emergency Medicine | Admitting: Emergency Medicine

## 2015-02-17 ENCOUNTER — Encounter (HOSPITAL_COMMUNITY): Payer: Self-pay | Admitting: *Deleted

## 2015-02-17 DIAGNOSIS — Z79899 Other long term (current) drug therapy: Secondary | ICD-10-CM | POA: Diagnosis not present

## 2015-02-17 DIAGNOSIS — L0231 Cutaneous abscess of buttock: Secondary | ICD-10-CM | POA: Insufficient documentation

## 2015-02-17 DIAGNOSIS — Z8719 Personal history of other diseases of the digestive system: Secondary | ICD-10-CM | POA: Diagnosis not present

## 2015-02-17 DIAGNOSIS — L0291 Cutaneous abscess, unspecified: Secondary | ICD-10-CM

## 2015-02-17 DIAGNOSIS — Z791 Long term (current) use of non-steroidal anti-inflammatories (NSAID): Secondary | ICD-10-CM | POA: Insufficient documentation

## 2015-02-17 DIAGNOSIS — Z8639 Personal history of other endocrine, nutritional and metabolic disease: Secondary | ICD-10-CM | POA: Insufficient documentation

## 2015-02-17 DIAGNOSIS — Z8742 Personal history of other diseases of the female genital tract: Secondary | ICD-10-CM | POA: Diagnosis not present

## 2015-02-17 DIAGNOSIS — Z8619 Personal history of other infectious and parasitic diseases: Secondary | ICD-10-CM | POA: Insufficient documentation

## 2015-02-17 DIAGNOSIS — F17200 Nicotine dependence, unspecified, uncomplicated: Secondary | ICD-10-CM | POA: Diagnosis not present

## 2015-02-17 MED ORDER — DOXYCYCLINE HYCLATE 100 MG PO TABS
100.0000 mg | ORAL_TABLET | Freq: Once | ORAL | Status: AC
  Administered 2015-02-17: 100 mg via ORAL
  Filled 2015-02-17: qty 1

## 2015-02-17 MED ORDER — DOXYCYCLINE HYCLATE 100 MG PO CAPS: 100.0000 mg | ORAL_CAPSULE | Freq: Two times a day (BID) | ORAL | Status: DC

## 2015-02-17 MED ORDER — HYDROCODONE-ACETAMINOPHEN 5-325 MG PO TABS: 2.0000 | ORAL_TABLET | ORAL | Status: DC | PRN

## 2015-02-17 NOTE — ED Notes (Signed)
Pt comfortable with discharge and follow up instructions. Pt declines wheelchair, escorted to waiting area by this RN. Prescriptions x2. 

## 2015-02-17 NOTE — ED Notes (Signed)
Pt reports having a boil on her buttock x several days with pressure to bottom. Denies fever.

## 2015-02-17 NOTE — Discharge Instructions (Signed)

## 2015-02-17 NOTE — ED Provider Notes (Signed)
CSN: [id number]     Arrival date & time 02/17/15  0806 History   First MD Initiated Contact with Patient 02/17/15 [id number]     Chief Complaint  Patient presents with  . Abscess      HPI  She presents for evaluation of "boil"  Has some pain and swelling in her left buttock for the last several days. It "popped" this morning when she was here. She states that she has to go to work today and cannot miss work.  Past Medical History  Diagnosis Date  . Thyroid disease   . Trichomonas   . Seasonal allergies   . Miscarriage   . Ovarian cyst   . Umbilical hernia    Past Surgical History  Procedure Laterality Date  . Appendectomy    . Irrigation and debridement abscess     Family History  Problem Relation Age of Onset  . Cancer Mother    Social History  Substance Use Topics  . Smoking status: Current Every Day Smoker -- 0.25 packs/day  . Smokeless tobacco: None  . Alcohol Use: Yes     Comment: occasional   OB History    Gravida Para Term Preterm AB TAB SAB Ectopic Multiple Living   5 2 2  3 1 2   2      Review of Systems  Constitutional: Negative for fever, chills, diaphoresis, appetite change and fatigue.  HENT: Negative for mouth sores, sore throat and trouble swallowing.   Eyes: Negative for visual disturbance.  Respiratory: Negative for cough, chest tightness, shortness of breath and wheezing.   Cardiovascular: Negative for chest pain.  Gastrointestinal: Negative for nausea, vomiting, abdominal pain, diarrhea and abdominal distention.  Endocrine: Negative for polydipsia, polyphagia and polyuria.  Genitourinary: Negative for dysuria, frequency and hematuria.  Musculoskeletal: Negative for gait problem.  Skin: Negative for color change, pallor and rash.       Abscess left buttock  Neurological: Negative for dizziness, syncope, light-headedness and headaches.  Hematological: Does not bruise/bleed easily.  Psychiatric/Behavioral: Negative for behavioral problems and  confusion.      Allergies  Other  Home Medications   Prior to Admission medications   Medication Sig Start Date End Date Taking? Authorizing Provider  albuterol (PROVENTIL HFA;VENTOLIN HFA) 108 (90 BASE) MCG/ACT inhaler Inhale 1-2 puffs into the lungs every 6 (six) hours as needed for wheezing or shortness of breath. 11/30/14   Charlesetta Shanks, MD  ALPRAZolam Duanne Moron) 0.5 MG tablet Take 0.5 mg by mouth 3 (three) times daily as needed for anxiety.    Historical Provider, MD  diphenhydrAMINE (BENADRYL) 25 MG tablet Take 1 tablet (25 mg total) by mouth every 6 (six) hours. Patient not taking: Reported on 11/30/2014 08/18/14   Domenic Moras, PA-C  doxycycline (VIBRAMYCIN) 100 MG capsule Take 1 capsule (100 mg total) by mouth 2 (two) times daily. 02/17/15   Tanna Furry, MD  HYDROcodone-acetaminophen (NORCO/VICODIN) 5-325 MG tablet Take 2 tablets by mouth every 4 (four) hours as needed. 02/17/15   Tanna Furry, MD  ibuprofen (ADVIL,MOTRIN) 200 MG tablet Take 400 mg by mouth every 6 (six) hours as needed for moderate pain.     Historical Provider, MD  naproxen (NAPROSYN) 500 MG tablet Take 1 tablet (500 mg total) by mouth 2 (two) times daily. 11/30/14   Charlesetta Shanks, MD  omeprazole (PRILOSEC) 20 MG capsule Take 1 capsule (20 mg total) by mouth daily. 11/30/14   Charlesetta Shanks, MD  ondansetron (ZOFRAN) 4 MG tablet Take 1 tablet (4  mg total) by mouth every 6 (six) hours. Patient not taking: Reported on 11/30/2014 08/08/12   Marissa Sciacca, PA-C  permethrin (ELIMITE) 5 % cream Apply from neck to toes, leave it on for 8-10 hrs then wash off completely.  Repeat in 1 week if no improvement. Patient not taking: Reported on 11/30/2014 08/18/14   Domenic Moras, PA-C  promethazine (PHENERGAN) 25 MG tablet Take 1 tablet (25 mg total) by mouth every 6 (six) hours as needed for nausea or vomiting. Patient not taking: Reported on 11/30/2014 12/16/12   Luvenia Redden, PA-C  Pseudoeph-Doxylamine-DM-APAP (NYQUIL PO) Take 30 mLs by  mouth once.    Historical Provider, MD  sertraline (ZOLOFT) 100 MG tablet Take 100 mg by mouth daily.    Historical Provider, MD  Spacer/Aero-Holding Chambers (AEROCHAMBER PLUS WITH MASK) inhaler Use as instructed 11/30/14   Charlesetta Shanks, MD   BP 159/125 mmHg  Pulse 82  Temp(Src) 99 F (37.2 C) (Oral)  Resp 20  SpO2 99%  LMP 02/10/2015 Physical Exam  Constitutional: She is oriented to person, place, and time. She appears well-developed and well-nourished. No distress.  HENT:  Head: Normocephalic.  Eyes: Conjunctivae are normal. Pupils are equal, round, and reactive to light. No scleral icterus.  Neck: Normal range of motion. Neck supple. No thyromegaly present.  Cardiovascular: Normal rate and regular rhythm.  Exam reveals no gallop and no friction rub.   No murmur heard. Pulmonary/Chest: Effort normal and breath sounds normal. No respiratory distress. She has no wheezes. She has no rales.  Abdominal: Soft. Bowel sounds are normal. She exhibits no distension. There is no tenderness. There is no rebound.  Genitourinary:     Musculoskeletal: Normal range of motion.  Neurological: She is alert and oriented to person, place, and time.  Skin: Skin is warm and dry. No rash noted.  Psychiatric: She has a normal mood and affect. Her behavior is normal.    ED Course  Procedures (including critical care time) Labs Review Labs Reviewed - No data to display  Imaging Review No results found. I have personally reviewed and evaluated these images and lab results as part of my medical decision-making.   EKG Interpretation None      MDM   Final diagnoses:  Abscess    Had a long discussion with the patient. She is hesitant and actually politely declines having incision and drainage and gauze placement. She states it will make it hurt much more. It is already spontaneously draining. I did tell her that if we were to perform incision and drainage it would be "numb" for less than an  hour and would have significant discomfort. However, initially the pain would become much less as the drainage continues.  She declines. She request antibiotics. She will P and warm compresses home from work. States she recently cannot miss work today. Encouraged her to return here if not improving for incision and drainage. Today is Friday and she does not work the weekend. Advised her back at any time she feels this is not improving or worsening.    Tanna Furry, MD 02/17/15 1009

## 2015-05-29 ENCOUNTER — Inpatient Hospital Stay (HOSPITAL_COMMUNITY)
Admission: AD | Admit: 2015-05-29 | Discharge: 2015-05-29 | Disposition: A | Discharge status: Home / Self Care | Payer: Medicaid Other | Source: Ambulatory Visit | Attending: Family Medicine | Admitting: Family Medicine

## 2015-05-29 ENCOUNTER — Encounter (HOSPITAL_COMMUNITY): Payer: Self-pay | Admitting: *Deleted

## 2015-05-29 DIAGNOSIS — A5901 Trichomonal vulvovaginitis: Secondary | ICD-10-CM | POA: Diagnosis present

## 2015-05-29 DIAGNOSIS — F17210 Nicotine dependence, cigarettes, uncomplicated: Secondary | ICD-10-CM | POA: Diagnosis not present

## 2015-05-29 DIAGNOSIS — A599 Trichomoniasis, unspecified: Secondary | ICD-10-CM | POA: Diagnosis not present

## 2015-05-29 DIAGNOSIS — N644 Mastodynia: Secondary | ICD-10-CM

## 2015-05-29 DIAGNOSIS — Z803 Family history of malignant neoplasm of breast: Secondary | ICD-10-CM | POA: Diagnosis not present

## 2015-05-29 DIAGNOSIS — J45909 Unspecified asthma, uncomplicated: Secondary | ICD-10-CM | POA: Insufficient documentation

## 2015-05-29 LAB — URINALYSIS, ROUTINE W REFLEX MICROSCOPIC
BILIRUBIN URINE: NEGATIVE
Glucose, UA: NEGATIVE mg/dL
Hgb urine dipstick: NEGATIVE
Ketones, ur: NEGATIVE mg/dL
Leukocytes, UA: NEGATIVE
Nitrite: NEGATIVE
PROTEIN: NEGATIVE mg/dL
pH: 5.5 (ref 5.0–8.0)

## 2015-05-29 LAB — POCT PREGNANCY, URINE: Preg Test, Ur: NEGATIVE

## 2015-05-29 LAB — WET PREP, GENITAL
Clue Cells Wet Prep HPF POC: NONE SEEN
SPERM: NONE SEEN
YEAST WET PREP: NONE SEEN

## 2015-05-29 MED ORDER — ALBUTEROL SULFATE HFA 108 (90 BASE) MCG/ACT IN AERS: 1.0000 | INHALATION_SPRAY | Freq: Four times a day (QID) | RESPIRATORY_TRACT | Status: DC | PRN

## 2015-05-29 MED ORDER — BUDESONIDE 180 MCG/ACT IN AEPB: 1.0000 | INHALATION_SPRAY | Freq: Two times a day (BID) | RESPIRATORY_TRACT | Status: DC

## 2015-05-29 MED ORDER — METRONIDAZOLE 500 MG PO TABS
2000.0000 mg | ORAL_TABLET | Freq: Once | ORAL | Status: AC
  Administered 2015-05-29: 2000 mg via ORAL
  Filled 2015-05-29: qty 4

## 2015-05-29 NOTE — MAU Provider Note (Signed)
History     CSN: [id number]  Arrival date and time: 05/29/15 1716   First Provider Initiated Contact with Patient 05/29/15 1752      Chief Complaint  Patient presents with  . Breast Pain  . Abdominal Pain   HPI   [name] is a 35 y.o. female [phone] presenting with breast tenderness/ swelling; in bilateral breasts.  The tenderness started one week ago. She has never had this before. The tenderness is all over both breasts; "they hurt and they are swollen". She took two pregnancy tests which both came back negative. She has not missed her period.  Her mother died of breast CA and the patient is very worried.   She is also complaining of abdominal pain. The Abdominal pain is located in middle of her abdomen and shoots to her lower abdomen. This pain started one week ago and has been off and on. The pain worsens when she eats and she feels she has been urinating a lot. She currently rates her abdominal pain 7/10. She has not taken anything for the pain.   Patient is a 10 year smoker; smokes 4-7 cigarettes per day. She was diagnosed with bronchitis 3-4 weeks ago and was given an albuterol inhaler. She feels the inhaler helps a lot with her breathing and finds her self using the inhaler multiple times every day. She works around a lot of chemicals and feels this places a role in her breathing issues/wheezing and allergies. She does not take a daily allergy pill.   No new sexual partners. Denies vaginal discharge.   LMP: 05/08/2015  OB History    Gravida Para Term Preterm AB TAB SAB Ectopic Multiple Living   5 2 2  0 3 1 2  0 0 2      Past Medical History  Diagnosis Date  . Thyroid disease   . Trichomonas   . Seasonal allergies   . Miscarriage   . Ovarian cyst   . Umbilical hernia     Past Surgical History  Procedure Laterality Date  . Appendectomy    . Irrigation and debridement abscess      Family History  Problem Relation Age of Onset  . Cancer Mother      Social History  Substance Use Topics  . Smoking status: Current Every Day Smoker -- 0.25 packs/day  . Smokeless tobacco: None  . Alcohol Use: Yes     Comment: occasional    Allergies:  Allergies  Allergen Reactions  . Other Hives    nuts    Prescriptions prior to admission  Medication Sig Dispense Refill Last Dose  . albuterol (PROVENTIL HFA;VENTOLIN HFA) 108 (90 BASE) MCG/ACT inhaler Inhale 1-2 puffs into the lungs every 6 (six) hours as needed for wheezing or shortness of breath. 1 Inhaler 0 05/28/2015 at Unknown time  . ALPRAZolam (XANAX) 0.5 MG tablet Take 0.5 mg by mouth 3 (three) times daily as needed for anxiety.   Past Month at Unknown time  . ibuprofen (ADVIL,MOTRIN) 200 MG tablet Take 400 mg by mouth every 6 (six) hours as needed for moderate pain.    Past Month at Unknown time  . doxycycline (VIBRAMYCIN) 100 MG capsule Take 1 capsule (100 mg total) by mouth 2 (two) times daily. (Patient not taking: Reported on 05/29/2015) 20 capsule 0   . HYDROcodone-acetaminophen (NORCO/VICODIN) 5-325 MG tablet Take 2 tablets by mouth every 4 (four) hours as needed. (Patient not taking: Reported on 05/29/2015) 10 tablet 0   .  naproxen (NAPROSYN) 500 MG tablet Take 1 tablet (500 mg total) by mouth 2 (two) times daily. (Patient not taking: Reported on 05/29/2015) 30 tablet 0   . omeprazole (PRILOSEC) 20 MG capsule Take 1 capsule (20 mg total) by mouth daily. (Patient not taking: Reported on 05/29/2015) 14 capsule 0     Results for orders placed or performed during the hospital encounter of 05/29/15 (from the past 48 hour(s))  Urinalysis, Routine w reflex microscopic (not at [facility])     Status: Abnormal   Collection Time: 05/29/15  5:20 PM  Result Value Ref Range   Color, Urine YELLOW YELLOW   APPearance CLEAR CLEAR   Specific Gravity, Urine >1.030 (H) 1.005 - 1.030   pH 5.5 5.0 - 8.0   Glucose, UA NEGATIVE NEGATIVE mg/dL   Hgb urine dipstick NEGATIVE NEGATIVE   Bilirubin Urine NEGATIVE  NEGATIVE   Ketones, ur NEGATIVE NEGATIVE mg/dL   Protein, ur NEGATIVE NEGATIVE mg/dL   Nitrite NEGATIVE NEGATIVE   Leukocytes, UA NEGATIVE NEGATIVE    Comment: MICROSCOPIC NOT DONE ON URINES WITH NEGATIVE PROTEIN, BLOOD, LEUKOCYTES, NITRITE, OR GLUCOSE <1000 mg/dL.  Pregnancy, urine POC     Status: None   Collection Time: 05/29/15  5:59 PM  Result Value Ref Range   Preg Test, Ur NEGATIVE NEGATIVE    Comment:        THE SENSITIVITY OF THIS METHODOLOGY IS >24 mIU/mL   Wet prep, genital     Status: Abnormal   Collection Time: 05/29/15  6:10 PM  Result Value Ref Range   Yeast Wet Prep HPF POC NONE SEEN NONE SEEN   Trich, Wet Prep PRESENT (A) NONE SEEN   Clue Cells Wet Prep HPF POC NONE SEEN NONE SEEN   WBC, Wet Prep HPF POC FEW (A) NONE SEEN   Sperm NONE SEEN     Review of Systems  Constitutional: Negative for fever.  Gastrointestinal: Positive for nausea, abdominal pain and diarrhea. Negative for vomiting and constipation.  Genitourinary: Negative for dysuria.  Skin:       Bilateral breast tenderness and enlargement.    Physical Exam   Blood pressure 115/75, pulse 55, temperature 98.3 F (36.8 C), temperature source Oral, resp. rate 18, last menstrual period 05/08/2015.  Physical Exam  Constitutional: She is oriented to person, place, and time. She appears well-developed and well-nourished. No distress.  HENT:  Head: Normocephalic.  Eyes: Pupils are equal, round, and reactive to light.  Neck: Neck supple.  Respiratory: Right breast exhibits tenderness. Right breast exhibits no inverted nipple, no mass, no nipple discharge and no skin change. Left breast exhibits tenderness. Left breast exhibits no inverted nipple, no mass, no nipple discharge and no skin change. Breasts are symmetrical.  Tenderness throughout bilateral breast tissue. No focal abnormalities.   GI: Soft. There is tenderness in the right lower quadrant, suprapubic area and left lower quadrant. There is no  rigidity, no rebound and no guarding.  Genitourinary:  Wet prep and GC collected without a speculum.   Musculoskeletal: Normal range of motion.  Neurological: She is alert and oriented to person, place, and time.  Skin: Skin is warm. She is not diaphoretic.  Psychiatric: Her behavior is normal.    MAU Course  Procedures  None  MDM  Wet prep GC Flagyl 2 grams given PO for trichomonas. Patient is no longer with her partner.   Assessment and Plan   A:  1. Reactive airway disease without complication   2. Trichomonas infection  3. Breast tenderness in female     P:  Discharge home in stable condition RX: Pulmicort Use albuterol only as need, refill given on RX Condoms always Referral sent to the breast center for bilateral mammogram.  Return to MAU for emergencies.  If patient misses her period, at home pregnancy tests encouraged     Lezlie Lye, NP 05/29/2015 7:43 PM

## 2015-05-29 NOTE — MAU Note (Signed)
Patient is having breast pain and thinks her breasts are swollen.  She took 2 HPT that were negative.  "I'm just concerned because my mom died of breast cancer and I don't think I'm pregnant.  Not sure why they are so sore."

## 2015-05-29 NOTE — Discharge Instructions (Signed)
Asthma, Acute Bronchospasm °Acute bronchospasm caused by asthma is also referred to as an asthma attack. Bronchospasm means your air passages become narrowed. The narrowing is caused by inflammation and tightening of the muscles in the air tubes (bronchi) in your lungs. This can make it hard to breathe or cause you to wheeze and cough. °CAUSES °Possible triggers are: °· Animal dander from the skin, hair, or feathers of animals. °· Dust mites contained in house dust. °· Cockroaches. °· Pollen from trees or grass. °· Mold. °· Cigarette or tobacco smoke. °· Air pollutants such as dust, household cleaners, hair sprays, aerosol sprays, paint fumes, strong chemicals, or strong odors. °· Cold air or weather changes. Cold air may trigger inflammation. Winds increase molds and pollens in the air. °· Strong emotions such as crying or laughing hard. °· Stress. °· Certain medicines such as aspirin or beta-blockers. °· Sulfites in foods and drinks, such as dried fruits and wine. °· Infections or inflammatory conditions, such as a flu, cold, or inflammation of the nasal membranes (rhinitis). °· Gastroesophageal reflux disease (GERD). GERD is a condition where stomach acid backs up into your esophagus. °· Exercise or strenuous activity. °SIGNS AND SYMPTOMS  °· Wheezing. °· Excessive coughing, particularly at night. °· Chest tightness. °· Shortness of breath. °DIAGNOSIS  °Your health care provider will ask you about your medical history and perform a physical exam. A chest X-ray or blood testing may be performed to look for other causes of your symptoms or other conditions that may have triggered your asthma attack.  °TREATMENT  °Treatment is aimed at reducing inflammation and opening up the airways in your lungs.  Most asthma attacks are treated with inhaled medicines. These include quick relief or rescue medicines (such as bronchodilators) and controller medicines (such as inhaled corticosteroids). These medicines are sometimes  given through an inhaler or a nebulizer. Systemic steroid medicine taken by mouth or given through an IV tube also can be used to reduce the inflammation when an attack is moderate or severe. Antibiotic medicines are only used if a bacterial infection is present.  °HOME CARE INSTRUCTIONS  °· Rest. °· Drink plenty of liquids. This helps the mucus to remain thin and be easily coughed up. Only use caffeine in moderation and do not use alcohol until you have recovered from your illness. °· Do not smoke. Avoid being exposed to secondhand smoke. °· You play a critical role in keeping yourself in good health. Avoid exposure to things that cause you to wheeze or to have breathing problems. °· Keep your medicines up-to-date and available. Carefully follow your health care provider's treatment plan. °· Take your medicine exactly as prescribed. °· When pollen or pollution is bad, keep windows closed and use an air conditioner or go to places with air conditioning. °· Asthma requires careful medical care. See your health care provider for a follow-up as advised. If you are more than [redacted] weeks pregnant and you were prescribed any new medicines, let your obstetrician know about the visit and how you are doing. Follow up with your health care provider as directed. °· After you have recovered from your asthma attack, make an appointment with your outpatient doctor to talk about ways to reduce the likelihood of future attacks. If you do not have a doctor who manages your asthma, make an appointment with a primary care doctor to discuss your asthma. °SEEK IMMEDIATE MEDICAL CARE IF:  °· You are getting worse. °· You have trouble breathing. If severe, call your local   emergency services (911 in the [place]  You develop chest pain or discomfort.  You are vomiting.  You are not able to keep fluids down.  You are coughing up yellow, green, brown, or bloody sputum.  You have a fever and your symptoms suddenly get worse.  You have  trouble swallowing. MAKE SURE YOU:   Understand these instructions.  Will watch your condition.  Will get help right away if you are not doing well or get worse.   This information is not intended to replace advice given to you by your health care provider. Make sure you discuss any questions you have with your health care provider.   Document Released: 05/15/2006 Document Revised: 02/02/2013 Document Reviewed: 08/05/2012 Elsevier Interactive Patient Education 2016 [employer]. Trichomoniasis Trichomoniasis is an infection caused by an organism called Trichomonas. The infection can affect both women and men. In women, the outer female genitalia and the vagina are affected. In men, the penis is mainly affected, but the prostate and other reproductive organs can also be involved. Trichomoniasis is a sexually transmitted infection (STI) and is most often passed to another person through sexual contact.  RISK FACTORS  Having unprotected sexual intercourse.  Having sexual intercourse with an infected partner. SIGNS AND SYMPTOMS  Symptoms of trichomoniasis in women include:  Abnormal gray-green frothy vaginal discharge.  Itching and irritation of the vagina.  Itching and irritation of the area outside the vagina. Symptoms of trichomoniasis in men include:   Penile discharge with or without pain.  Pain during urination. This results from inflammation of the urethra. DIAGNOSIS  Trichomoniasis may be found during a Pap test or physical exam. Your health care provider may use one of the following methods to help diagnose this infection:  Testing the pH of the vagina with a test tape.  Using a vaginal swab test that checks for the Trichomonas organism. A test is available that provides results within a few minutes.  Examining a urine sample.  Testing vaginal secretions. Your health care provider may test you for other STIs, including HIV. TREATMENT   You may be given medicine to  fight the infection. Women should inform their health care provider if they could be or are pregnant. Some medicines used to treat the infection should not be taken during pregnancy.  Your health care provider may recommend over-the-counter medicines or creams to decrease itching or irritation.  Your sexual partner will need to be treated if infected.  Your health care provider may test you for infection again 3 months after treatment. HOME CARE INSTRUCTIONS   Take medicines only as directed by your health care provider.  Take over-the-counter medicine for itching or irritation as directed by your health care provider.  Do not have sexual intercourse while you have the infection.  Women should not douche or wear tampons while they have the infection.  Discuss your infection with your partner. Your partner may have gotten the infection from you, or you may have gotten it from your partner.  Have your sex partner get examined and treated if necessary.  Practice safe, informed, and protected sex.  See your health care provider for other STI testing. SEEK MEDICAL CARE IF:   You still have symptoms after you finish your medicine.  You develop abdominal pain.  You have pain when you urinate.  You have bleeding after sexual intercourse.  You develop a rash.  Your medicine makes you sick or makes you throw up (vomit). MAKE SURE YOU:  Understand  these instructions.  Will watch your condition.  Will get help right away if you are not doing well or get worse.   This information is not intended to replace advice given to you by your health care provider. Make sure you discuss any questions you have with your health care provider.   Document Released: 07/24/2000 Document Revised: 02/18/2014 Document Reviewed: 11/09/2012 Elsevier Interactive Patient Education [employer].

## 2015-05-30 LAB — GC/CHLAMYDIA PROBE AMP (~~LOC~~) NOT AT ARMC
Chlamydia: NEGATIVE
Neisseria Gonorrhea: NEGATIVE

## 2015-06-26 ENCOUNTER — Inpatient Hospital Stay (HOSPITAL_COMMUNITY): Payer: Medicaid Other

## 2015-06-26 ENCOUNTER — Inpatient Hospital Stay (HOSPITAL_COMMUNITY)
Admission: AD | Admit: 2015-06-26 | Discharge: 2015-06-26 | Discharge status: Against Medical Advice | Payer: Medicaid Other | Source: Ambulatory Visit | Attending: Family Medicine | Admitting: Family Medicine

## 2015-06-26 ENCOUNTER — Encounter (HOSPITAL_COMMUNITY): Payer: Self-pay

## 2015-06-26 DIAGNOSIS — F17200 Nicotine dependence, unspecified, uncomplicated: Secondary | ICD-10-CM | POA: Diagnosis not present

## 2015-06-26 DIAGNOSIS — K432 Incisional hernia without obstruction or gangrene: Secondary | ICD-10-CM

## 2015-06-26 DIAGNOSIS — K5790 Diverticulosis of intestine, part unspecified, without perforation or abscess without bleeding: Secondary | ICD-10-CM | POA: Insufficient documentation

## 2015-06-26 DIAGNOSIS — R1084 Generalized abdominal pain: Secondary | ICD-10-CM

## 2015-06-26 DIAGNOSIS — Z9049 Acquired absence of other specified parts of digestive tract: Secondary | ICD-10-CM | POA: Diagnosis not present

## 2015-06-26 DIAGNOSIS — R109 Unspecified abdominal pain: Secondary | ICD-10-CM | POA: Diagnosis not present

## 2015-06-26 DIAGNOSIS — K439 Ventral hernia without obstruction or gangrene: Secondary | ICD-10-CM | POA: Diagnosis not present

## 2015-06-26 LAB — CBC
HCT: 38.9 % (ref 36.0–46.0)
Hemoglobin: 12.8 g/dL (ref 12.0–15.0)
MCH: 29 pg (ref 26.0–34.0)
MCHC: 32.9 g/dL (ref 30.0–36.0)
MCV: 88 fL (ref 78.0–100.0)
PLATELETS: 279 10*3/uL (ref 150–400)
RBC: 4.42 MIL/uL (ref 3.87–5.11)
RDW: 14.7 % (ref 11.5–15.5)
WBC: 7.9 10*3/uL (ref 4.0–10.5)

## 2015-06-26 LAB — URINALYSIS, ROUTINE W REFLEX MICROSCOPIC
BILIRUBIN URINE: NEGATIVE
GLUCOSE, UA: NEGATIVE mg/dL
KETONES UR: NEGATIVE mg/dL
LEUKOCYTES UA: NEGATIVE
Nitrite: NEGATIVE
PH: 5 (ref 5.0–8.0)
PROTEIN: NEGATIVE mg/dL
Specific Gravity, Urine: 1.025 (ref 1.005–1.030)

## 2015-06-26 LAB — BASIC METABOLIC PANEL
ANION GAP: 6 (ref 5–15)
BUN: 14 mg/dL (ref 6–20)
CHLORIDE: 107 mmol/L (ref 101–111)
CO2: 26 mmol/L (ref 22–32)
Calcium: 8.8 mg/dL — ABNORMAL LOW (ref 8.9–10.3)
Creatinine, Ser: 0.74 mg/dL (ref 0.44–1.00)
Glucose, Bld: 94 mg/dL (ref 65–99)
POTASSIUM: 4.6 mmol/L (ref 3.5–5.1)
SODIUM: 139 mmol/L (ref 135–145)

## 2015-06-26 LAB — HCG, QUANTITATIVE, PREGNANCY

## 2015-06-26 LAB — POCT PREGNANCY, URINE: Preg Test, Ur: NEGATIVE

## 2015-06-26 LAB — URINE MICROSCOPIC-ADD ON

## 2015-06-26 LAB — TSH: TSH: 1.493 u[IU]/mL (ref 0.350–4.500)

## 2015-06-26 MED ORDER — HYDROMORPHONE HCL 1 MG/ML IJ SOLN
1.0000 mg | Freq: Once | INTRAMUSCULAR | Status: DC
  Filled 2015-06-26: qty 1

## 2015-06-26 MED ORDER — KETOROLAC TROMETHAMINE 60 MG/2ML IM SOLN
60.0000 mg | Freq: Once | INTRAMUSCULAR | Status: AC
  Administered 2015-06-26: 60 mg via INTRAMUSCULAR
  Filled 2015-06-26: qty 2

## 2015-06-26 MED ORDER — IOPAMIDOL (ISOVUE-300) INJECTION 61%: 100.0000 mL | Freq: Once | INTRAVENOUS | Status: DC | PRN

## 2015-06-26 NOTE — MAU Provider Note (Signed)
Chief Complaint:  No chief complaint on file.   First Provider Initiated Contact with Patient [id number]      Valerie Walter is a 35 y.o. [id number] who presents to maternity admissions reporting severe crampy abdominal pain. States had a positive pregnancy test at home, but took the test then walked the dog.   Did not read it until much later.   Is sure she is pregnant, because she has gained weight and breasts are larger.    Started bleeding yesterday, which is in time for her period, but she does not think it is her period. She reports no vaginal itching/burning, urinary symptoms, h/a, dizziness, n/v, or fever/chills. Has had alternating constipation and diarrhea.  Diarrhea this week, "every time I eat".  History is remarkable for large ventral/supraumbilical hernia (had mild incarceration in 2010, never repaired), hypothyroidism but not taking meds (since before 2010), and diverticulosis of ascending colon and cecum.Marland Kitchen   Has had an appendectomy and cholecystectomy in past.    Abdominal Pain This is a new problem. The current episode started yesterday. The onset quality is gradual. The problem occurs intermittently. The problem has been unchanged. The pain is located in the generalized abdominal region. The quality of the pain is colicky, cramping, burning and sharp. The abdominal pain does not radiate. Associated symptoms include diarrhea. Pertinent negatives include no constipation, dysuria, fever, nausea or vomiting. The pain is aggravated by palpation and eating. The pain is relieved by nothing. She has tried nothing for the symptoms. Her past medical history is significant for abdominal surgery.   RN Note: Had been here in April, thought she might be preg (breasts were sore), test was neg. The next day she started bleeding, was on for 3 days. Breasts are really sore, back is hurting, is gaining weight, nauseated. +HPT 3 days ago. Started bleeding yesterday, is getting heavier and having a lot  of back pain. Is worser than yesterday, feels like she is giving birth  Past Medical History: Past Medical History  Diagnosis Date  . Thyroid disease   . Trichomonas   . Seasonal allergies   . Miscarriage   . Ovarian cyst   . Umbilical hernia   Hypothyroidism Ventral Hernia Diverticulosis  Past obstetric history: OB History  Gravida Para Term Preterm AB SAB TAB Ectopic Multiple Living  5 2 2  0 3 2 1  0 0 2    # Outcome Date GA Lbr Len/2nd Weight Sex Delivery Anes PTL Lv  5 Term 01/05/03    F Vag-Spont   Y  4 Term 03/21/00    F Vag-Spont EPI  Y  3 SAB           2 TAB           1 SAB               Past Surgical History: Past Surgical History  Procedure Laterality Date  . Appendectomy    . Irrigation and debridement abscess    Cholecystectomy  Family History: Family History  Problem Relation Age of Onset  . Cancer Mother     Social History: Social History  Substance Use Topics  . Smoking status: Current Every Day Smoker -- 0.25 packs/day  . Smokeless tobacco: Never Used  . Alcohol Use: Yes     Comment: occasional    Allergies:  Allergies  Allergen Reactions  . Other Hives    nuts    Meds:  Prescriptions prior to admission  Medication Sig Dispense  Refill Last Dose  . albuterol (PROVENTIL HFA;VENTOLIN HFA) 108 (90 Base) MCG/ACT inhaler Inhale 1-2 puffs into the lungs every 6 (six) hours as needed for wheezing or shortness of breath. 1 Inhaler 1 Past Month at Unknown time  . diphenhydrAMINE (BENADRYL) 25 MG tablet Take 25 mg by mouth every 6 (six) hours as needed for allergies.   Past Week at Unknown time  . budesonide (PULMICORT FLEXHALER) 180 MCG/ACT inhaler Inhale 1 puff into the lungs 2 (two) times daily. (Patient not taking: Reported on 06/26/2015) 1 Inhaler 2 Not Taking at Unknown time    I have reviewed patient's Past Medical Hx, Surgical Hx, Family Hx, Social Hx, medications and allergies.  ROS:  Review of Systems  Constitutional: Positive for  fatigue. Negative for fever and chills.  Respiratory: Negative for shortness of breath.   Gastrointestinal: Positive for abdominal pain and diarrhea. Negative for nausea, vomiting and constipation.  Genitourinary: Positive for pelvic pain. Negative for dysuria, vaginal bleeding and vaginal discharge.  Musculoskeletal: Negative for back pain.  Neurological: Negative for dizziness.   Other systems negative     Physical Exam  Patient Vitals for the past 24 hrs:  BP Temp Temp src Pulse Resp Height Weight  06/26/15 1006 - - - - - 5\' 3"  (1.6 m) 174 lb (78.926 kg)  06/26/15 0948 123/78 mmHg 98.3 F (36.8 C) Oral (!) 57 18 - 174 lb 9.6 oz (79.198 kg)   Constitutional: Well-developed, well-nourished female in no acute distress, but uncomfortable looking  Cardiovascular: normal rate and rhythm, no ectopy audible, S1 & S2 heard, no murmur Respiratory: normal effort, no distress. Lungs CTAB with no wheezes or crackles GI: Abd soft, tender throughout.  Nondistended.  No rebound, No guarding.  Bowel Sounds audible    There is a large ventral hernia above umbilicus, with a small 2cm mass in center of it, not reduceable..  Reexamination an hour later, mass was more reduceable. MS: Extremities nontender, no edema, normal ROM Neurologic: Alert and oriented x 4.   Grossly nonfocal. GU: Neg CVAT. Skin:  Warm and Dry Psych:  Affect appropriate.  PELVIC EXAM: Cervix pink, visually closed, without lesion, scant white creamy discharge, vaginal walls and external genitalia normal Bimanual exam: Cervix firm, with ?fibroid, anterior, neg CMT, uterus nontender, nonenlarged, adnexa without tenderness, enlargement, or mass    Labs: Results for orders placed or performed during the hospital encounter of 06/26/15 (from the past 24 hour(s))  Urinalysis, Routine w reflex microscopic (not at [facility])     Status: Abnormal   Collection Time: 06/26/15  9:50 AM  Result Value Ref Range   Color, Urine YELLOW YELLOW    APPearance CLEAR CLEAR   Specific Gravity, Urine 1.025 1.005 - 1.030   pH 5.0 5.0 - 8.0   Glucose, UA NEGATIVE NEGATIVE mg/dL   Hgb urine dipstick LARGE (A) NEGATIVE   Bilirubin Urine NEGATIVE NEGATIVE   Ketones, ur NEGATIVE NEGATIVE mg/dL   Protein, ur NEGATIVE NEGATIVE mg/dL   Nitrite NEGATIVE NEGATIVE   Leukocytes, UA NEGATIVE NEGATIVE  Urine microscopic-add on     Status: Abnormal   Collection Time: 06/26/15  9:50 AM  Result Value Ref Range   Squamous Epithelial / LPF 6-30 (A) NONE SEEN   WBC, UA 0-5 0 - 5 WBC/hpf   RBC / HPF 6-30 0 - 5 RBC/hpf   Bacteria, UA MANY (A) NONE SEEN   Urine-Other MUCOUS PRESENT   Pregnancy, urine POC     Status: None  Collection Time: 06/26/15 10:01 AM  Result Value Ref Range   Preg Test, Ur NEGATIVE NEGATIVE  CBC     Status: None   Collection Time: 06/26/15 10:17 AM  Result Value Ref Range   WBC 7.9 4.0 - 10.5 K/uL   RBC 4.42 3.87 - 5.11 MIL/uL   Hemoglobin 12.8 12.0 - 15.0 g/dL   HCT 38.9 36.0 - 46.0 %   MCV 88.0 78.0 - 100.0 fL   MCH 29.0 26.0 - 34.0 pg   MCHC 32.9 30.0 - 36.0 g/dL   RDW 14.7 11.5 - 15.5 %   Platelets 279 150 - 400 K/uL  hCG, quantitative, pregnancy     Status: None   Collection Time: 06/26/15 10:18 AM  Result Value Ref Range   hCG, Beta Chain, Quant, S <1 <5 mIU/mL    Imaging:  No results found.  MAU Course/MDM: I have ordered labs as follows:  CBC, bmet, quant hcg, declines repeat STD testing Imaging ordered: abdominal CT with contrast Results reviewed.   Consult Dr Nehemiah Settle, who recommends CT scan to check for incarcerated hernia.  Patient drank a small amount of contrast but then decided she wanted to leave. States does not have time to do this.  Discussed danger of undiagnosed incarcerated hernia.  Patient states she is willing to take the risk and sign out AMA.  Discussed if she decides to go back in, go to [facility] or [facility]  Pt stable at time of AMA  Assessment: Abdominal pain  Known diverticulosis Known  large ventral hernia, possible incarceration History of hypothyroidism, not taking meds  Plan: Patient left AMA without CT scan Warning signs reviewed Instructed to go to [facility] or [facility] if pain returns TSH sent, informed cannot Rx Synthroid, needs to be addressed by family MD (pt has one)    Medication List    ASK your doctor about these medications        albuterol 108 (90 Base) MCG/ACT inhaler  Commonly known as:  PROVENTIL HFA;VENTOLIN HFA  Inhale 1-2 puffs into the lungs every 6 (six) hours as needed for wheezing or shortness of breath.     budesonide 180 MCG/ACT inhaler  Commonly known as:  PULMICORT FLEXHALER  Inhale 1 puff into the lungs 2 (two) times daily.     diphenhydrAMINE 25 MG tablet  Commonly known as:  BENADRYL  Take 25 mg by mouth every 6 (six) hours as needed for allergies.       Encouraged to return to Urgent Care/ED if she develops worsening of symptoms, increase in pain, fever, or other concerning symptoms.   Hansel Feinstein CNM, MSN Certified Nurse-Midwife 06/26/2015 11:07 AM

## 2015-06-26 NOTE — Discharge Instructions (Signed)
Hernia, Adult A hernia is the bulging of an organ or tissue through a weak spot in the muscles of the abdomen (abdominal wall). Hernias develop most often near the navel or groin. There are many kinds of hernias. Common kinds include:  Femoral hernia. This kind of hernia develops under the groin in the upper thigh area.  Inguinal hernia. This kind of hernia develops in the groin or scrotum.  Umbilical hernia. This kind of hernia develops near the navel.  Hiatal hernia. This kind of hernia causes part of the stomach to be pushed up into the chest.  Incisional hernia. This kind of hernia bulges through a scar from an abdominal surgery. CAUSES This condition may be caused by:  Heavy lifting.  Coughing over a long period of time.  Straining to have a bowel movement.  An incision made during an abdominal surgery.  A birth defect (congenital defect).  Excess weight or obesity.  Smoking.  Poor nutrition.  Cystic fibrosis.  Excess fluid in the abdomen.  Undescended testicles. SYMPTOMS Symptoms of a hernia include:  A lump on the abdomen. This is the first sign of a hernia. The lump may become more obvious with standing, straining, or coughing. It may get bigger over time if it is not treated or if the condition causing it is not treated.  Pain. A hernia is usually painless, but it may become painful over time if treatment is delayed. The pain is usually dull and may get worse with standing or lifting heavy objects. Sometimes a hernia gets tightly squeezed in the weak spot (strangulated) or stuck there (incarcerated) and causes additional symptoms. These symptoms may include:  Vomiting.  Nausea.  Constipation.  Irritability. DIAGNOSIS A hernia may be diagnosed with:  A physical exam. During the exam your health care provider may ask you to cough or to make a specific movement, because a hernia is usually more visible when you move.  Imaging tests. These can  include:  X-rays.  Ultrasound.  CT scan. TREATMENT A hernia that is small and painless may not need to be treated. A hernia that is large or painful may be treated with surgery. Inguinal hernias may be treated with surgery to prevent incarceration or strangulation. Strangulated hernias are always treated with surgery, because lack of blood to the trapped organ or tissue can cause it to die. Surgery to treat a hernia involves pushing the bulge back into place and repairing the weak part of the abdomen. HOME CARE INSTRUCTIONS  Avoid straining.  Do not lift anything heavier than 10 lb (4.5 kg).  Lift with your leg muscles, not your back muscles. This helps avoid strain.  When coughing, try to cough gently.  Prevent constipation. Constipation leads to straining with bowel movements, which can make a hernia worse or cause a hernia repair to break down. You can prevent constipation by:  Eating a high-fiber diet that includes plenty of fruits and vegetables.  Drinking enough fluids to keep your urine clear or pale yellow. Aim to drink 6-8 glasses of water per day.  Using a stool softener as directed by your health care provider.  Lose weight, if you are overweight.  Do not use any tobacco products, including cigarettes, chewing tobacco, or electronic cigarettes. If you need help quitting, ask your health care provider.  Keep all follow-up visits as directed by your health care provider. This is important. Your health care provider may need to monitor your condition. SEEK MEDICAL CARE IF:  You have   swelling, redness, and pain in the affected area.  Your bowel habits change. SEEK IMMEDIATE MEDICAL CARE IF:  You have a fever.  You have abdominal pain that is getting worse.  You feel nauseous or you vomit.  You cannot push the hernia back in place by gently pressing on it while you are lying down.  The hernia:  Changes in shape or size.  Is stuck outside the  abdomen.  Becomes discolored.  Feels hard or tender.   This information is not intended to replace advice given to you by your health care provider. Make sure you discuss any questions you have with your health care provider.   Document Released: 01/28/2005 Document Revised: 02/18/2014 Document Reviewed: 12/08/2013 Elsevier Interactive Patient Education 2016 [employer]  

## 2015-06-26 NOTE — MAU Note (Addendum)
Had been here in April, thought she might be preg (breasts were sore), test was neg. The next day she started bleeding, was on for 3 days.  Breasts are really sore, back is hurting, is gaining weight, nauseated. +HPT 3 days ago.  Started bleeding yesterday, is getting heavier and having a lot of back pain.  Is worser than yesterday, feels like she is giving birth

## 2015-06-27 LAB — HIV ANTIBODY (ROUTINE TESTING W REFLEX): HIV Screen 4th Generation wRfx: NONREACTIVE

## 2015-09-05 ENCOUNTER — Encounter (HOSPITAL_COMMUNITY): Payer: Self-pay | Admitting: Emergency Medicine

## 2015-09-05 ENCOUNTER — Ambulatory Visit (HOSPITAL_COMMUNITY)
Admission: EM | Admit: 2015-09-05 | Discharge: 2015-09-05 | Disposition: A | Discharge status: Home / Self Care | Payer: Medicaid Other | Attending: Family Medicine | Admitting: Family Medicine

## 2015-09-05 DIAGNOSIS — H6691 Otitis media, unspecified, right ear: Secondary | ICD-10-CM | POA: Diagnosis not present

## 2015-09-05 DIAGNOSIS — F17200 Nicotine dependence, unspecified, uncomplicated: Secondary | ICD-10-CM | POA: Insufficient documentation

## 2015-09-05 DIAGNOSIS — Z79899 Other long term (current) drug therapy: Secondary | ICD-10-CM | POA: Insufficient documentation

## 2015-09-05 DIAGNOSIS — J029 Acute pharyngitis, unspecified: Secondary | ICD-10-CM | POA: Diagnosis present

## 2015-09-05 LAB — POCT RAPID STREP A: Streptococcus, Group A Screen (Direct): NEGATIVE

## 2015-09-05 MED ORDER — IBUPROFEN 800 MG PO TABS
800.0000 mg | ORAL_TABLET | Freq: Once | ORAL | Status: AC
  Administered 2015-09-05: 800 mg via ORAL

## 2015-09-05 MED ORDER — IBUPROFEN 800 MG PO TABS
ORAL_TABLET | ORAL | Status: AC
  Filled 2015-09-05: qty 1

## 2015-09-05 MED ORDER — AMOXICILLIN 500 MG PO CAPS: 500.0000 mg | ORAL_CAPSULE | Freq: Three times a day (TID) | ORAL | 0 refills | Status: DC

## 2015-09-05 MED ORDER — HYDROCODONE-ACETAMINOPHEN 10-325 MG PO TABS: 1.0000 | ORAL_TABLET | Freq: Four times a day (QID) | ORAL | 0 refills | Status: DC | PRN

## 2015-09-05 NOTE — ED Provider Notes (Signed)
CSN: [id number]     Arrival date & time 09/05/15  1320 History   None    Chief Complaint  Patient presents with  . Sore Throat   (Consider location/radiation/quality/duration/timing/severity/associated sxs/prior Treatment) HPI History obtained from patient:  Pt presents with the cc of:  Feeling unwell Duration of symptoms: 2 days Treatment prior to arrival: Ibuprofen and fluids Context: Onset of fever sore throat and right earache yesterday. Was able to go to work for a short period of time but then started to feel ill and has come to the urgent care now for evaluation. Has not had any medication since about 9:30 this morning for fever. Other symptoms include: Body aches. Pain score: 3 FAMILY HISTORY: Hypertension    Past Medical History:  Diagnosis Date  . Miscarriage   . Ovarian cyst   . Seasonal allergies   . Thyroid disease   . Trichomonas   . Umbilical hernia    Past Surgical History:  Procedure Laterality Date  . APPENDECTOMY    . IRRIGATION AND DEBRIDEMENT ABSCESS     Family History  Problem Relation Age of Onset  . Cancer Mother    Social History  Substance Use Topics  . Smoking status: Current Every Day Smoker    Packs/day: 0.25  . Smokeless tobacco: Never Used  . Alcohol use Yes     Comment: occasional   OB History    Gravida Para Term Preterm AB Living   5 2 2  0 3 2   SAB TAB Ectopic Multiple Live Births   2 1 0 0       Review of Systems  Denies: HEADACHE, NAUSEA, ABDOMINAL PAIN, CHEST PAIN, CONGESTION, DYSURIA, SHORTNESS OF BREATH  Allergies  Other  Home Medications   Prior to Admission medications   Medication Sig Start Date End Date Taking? Authorizing Provider  albuterol (PROVENTIL HFA;VENTOLIN HFA) 108 (90 Base) MCG/ACT inhaler Inhale 1-2 puffs into the lungs every 6 (six) hours as needed for wheezing or shortness of breath. 05/29/15   Lezlie Lye, NP  amoxicillin (AMOXIL) 500 MG capsule Take 1 capsule (500 mg total) by mouth 3  (three) times daily. 09/05/15   Konrad Felix, PA  budesonide (PULMICORT FLEXHALER) 180 MCG/ACT inhaler Inhale 1 puff into the lungs 2 (two) times daily. Patient not taking: Reported on 06/26/2015 05/29/15   Lezlie Lye, NP  diphenhydrAMINE (BENADRYL) 25 MG tablet Take 25 mg by mouth every 6 (six) hours as needed for allergies.    Historical Provider, MD  HYDROcodone-acetaminophen (NORCO) 10-325 MG tablet Take 1 tablet by mouth every 6 (six) hours as needed. 09/05/15   Konrad Felix, PA   Meds Ordered and Administered this Visit  Medications - No data to display  BP 122/82 (BP Location: Left Arm)   Pulse 89   Temp 102.9 F (39.4 C) (Oral) Comment: last meds taken for fever today at 9am, [facility] informed of Pts temp  Resp 20   Ht 5\' 3"  (1.6 m)   Wt 175 lb (79.4 kg)   LMP 08/15/2015 (Exact Date)   SpO2 100%   BMI 31.00 kg/m  No data found.   Physical Exam  NURSES NOTES AND VITAL SIGNS REVIEWED. CONSTITUTIONAL: Well developed, well nourished, no acute distress HEENT: normocephalic, atraumatic, right ear TM is red bulging with poor light reflex and no motion. Throat is slightly injected without tonsillar hypertrophy or exudate. EYES: Conjunctiva normal NECK:normal ROM, supple, no adenopathy PULMONARY:No respiratory distress, normal effort ABDOMINAL:  Soft, ND, NT BS+, No CVAT MUSCULOSKELETAL: Normal ROM of all extremities,  SKIN: warm and dry without rash PSYCHIATRIC: Mood and affect, behavior are normal  Urgent Care Course   Clinical Course    Procedures (including critical care time)  Labs Review Labs Reviewed  POCT RAPID STREP A    Imaging Review No results found.   Visual Acuity Review  Right Eye Distance:   Left Eye Distance:   Bilateral Distance:    Right Eye Near:   Left Eye Near:    Bilateral Near:     Prescription for amoxicillin and hydrocodone provided a return to work note provided.    MDM   1. Acute right otitis media, recurrence not  specified, unspecified otitis media type     Patient is reassured that there are no issues that require transfer to higher level of care at this time or additional tests. Patient is advised to continue home symptomatic treatment. Patient is advised that if there are new or worsening symptoms to attend the emergency department, contact primary care provider, or return to UC. Instructions of care provided discharged home in stable condition.    THIS NOTE WAS GENERATED USING A VOICE RECOGNITION SOFTWARE PROGRAM. ALL REASONABLE EFFORTS  WERE MADE TO PROOFREAD THIS DOCUMENT FOR ACCURACY.  I have verbally reviewed the discharge instructions with the patient. A printed AVS was given to the patient.  All questions were answered prior to discharge.      Konrad Felix, PA 09/05/15 1501

## 2015-09-05 NOTE — ED Triage Notes (Signed)
The patient presented to the [facility] with a complaint of a sore throat, right side otalgia, fever and a headache x 1 day.

## 2015-09-05 NOTE — ED Notes (Signed)
Urine specimen obtained while patient still in the waiting/lobby room

## 2015-09-07 ENCOUNTER — Emergency Department (HOSPITAL_COMMUNITY)
Admission: EM | Admit: 2015-09-07 | Discharge: 2015-09-07 | Disposition: A | Discharge status: Home / Self Care | Payer: Medicaid Other | Attending: Dermatology | Admitting: Dermatology

## 2015-09-07 ENCOUNTER — Encounter (HOSPITAL_COMMUNITY): Payer: Self-pay | Admitting: Emergency Medicine

## 2015-09-07 ENCOUNTER — Encounter (HOSPITAL_COMMUNITY): Payer: Self-pay | Admitting: *Deleted

## 2015-09-07 DIAGNOSIS — J029 Acute pharyngitis, unspecified: Secondary | ICD-10-CM | POA: Diagnosis present

## 2015-09-07 DIAGNOSIS — F17200 Nicotine dependence, unspecified, uncomplicated: Secondary | ICD-10-CM | POA: Diagnosis not present

## 2015-09-07 DIAGNOSIS — Z5321 Procedure and treatment not carried out due to patient leaving prior to being seen by health care provider: Secondary | ICD-10-CM | POA: Insufficient documentation

## 2015-09-07 LAB — CULTURE, GROUP A STREP (THRC)

## 2015-09-07 LAB — RAPID STREP SCREEN (MED CTR MEBANE ONLY): STREPTOCOCCUS, GROUP A SCREEN (DIRECT): NEGATIVE

## 2015-09-07 MED ORDER — ACETAMINOPHEN 160 MG/5ML PO SOLN
650.0000 mg | Freq: Once | ORAL | Status: AC
  Administered 2015-09-07: 650 mg via ORAL
  Filled 2015-09-07: qty 20.3

## 2015-09-07 NOTE — ED Notes (Addendum)
Pt walked up to the desk stating she was leaving due to the wait, encouraged to stay. Pt is not seen in the lobby.

## 2015-09-07 NOTE — ED Triage Notes (Signed)
Pt reports having sore throat x 1 week. Has been seen x 2 and had negative strep. reports feeling like she has swelling to throat and severe pain when swallowing. Febrile at triage. Airway is intact. She is able to swallow but has severe pain.

## 2015-09-07 NOTE — ED Notes (Signed)
Pt called for reassessment, no answer.

## 2015-09-07 NOTE — ED Notes (Signed)
Called Pt for reassessment at 2100, no response

## 2015-09-07 NOTE — ED Triage Notes (Signed)
Pt did not want to wait and decided to go to a urgent care.

## 2015-09-07 NOTE — ED Triage Notes (Addendum)
Pt reports sore throat and R ear pain since Monday. Went to UC on Tuesday and was given rx for amoxicillin. Pt not feeling any better. Had fever at home, but took ibuprofen prior to arrival.

## 2015-09-08 ENCOUNTER — Encounter (HOSPITAL_BASED_OUTPATIENT_CLINIC_OR_DEPARTMENT_OTHER): Payer: Self-pay | Admitting: *Deleted

## 2015-09-08 ENCOUNTER — Emergency Department (HOSPITAL_BASED_OUTPATIENT_CLINIC_OR_DEPARTMENT_OTHER)
Admission: EM | Admit: 2015-09-08 | Discharge: 2015-09-08 | Disposition: A | Discharge status: Home / Self Care | Payer: Medicaid Other | Attending: Emergency Medicine | Admitting: Emergency Medicine

## 2015-09-08 ENCOUNTER — Emergency Department (HOSPITAL_BASED_OUTPATIENT_CLINIC_OR_DEPARTMENT_OTHER): Payer: Medicaid Other

## 2015-09-08 DIAGNOSIS — J039 Acute tonsillitis, unspecified: Secondary | ICD-10-CM | POA: Diagnosis not present

## 2015-09-08 DIAGNOSIS — H9202 Otalgia, left ear: Secondary | ICD-10-CM | POA: Diagnosis not present

## 2015-09-08 DIAGNOSIS — F17200 Nicotine dependence, unspecified, uncomplicated: Secondary | ICD-10-CM | POA: Diagnosis not present

## 2015-09-08 DIAGNOSIS — J029 Acute pharyngitis, unspecified: Secondary | ICD-10-CM | POA: Diagnosis present

## 2015-09-08 LAB — CBC WITH DIFFERENTIAL/PLATELET
Basophils Absolute: 0 10*3/uL (ref 0.0–0.1)
Basophils Relative: 0 %
Eosinophils Absolute: 0 10*3/uL (ref 0.0–0.7)
Eosinophils Relative: 0 %
HEMATOCRIT: 36.3 % (ref 36.0–46.0)
HEMOGLOBIN: 12.3 g/dL (ref 12.0–15.0)
LYMPHS ABS: 2.1 10*3/uL (ref 0.7–4.0)
Lymphocytes Relative: 19 %
MCH: 28.6 pg (ref 26.0–34.0)
MCHC: 33.9 g/dL (ref 30.0–36.0)
MCV: 84.4 fL (ref 78.0–100.0)
MONO ABS: 0.8 10*3/uL (ref 0.1–1.0)
MONOS PCT: 7 %
NEUTROS ABS: 8.3 10*3/uL — AB (ref 1.7–7.7)
NEUTROS PCT: 74 %
Platelets: 289 10*3/uL (ref 150–400)
RBC: 4.3 MIL/uL (ref 3.87–5.11)
RDW: 14.4 % (ref 11.5–15.5)
WBC: 11.2 10*3/uL — ABNORMAL HIGH (ref 4.0–10.5)

## 2015-09-08 LAB — RAPID STREP SCREEN (MED CTR MEBANE ONLY): Streptococcus, Group A Screen (Direct): NEGATIVE

## 2015-09-08 LAB — BASIC METABOLIC PANEL
ANION GAP: 10 (ref 5–15)
BUN: 8 mg/dL (ref 6–20)
CALCIUM: 8.7 mg/dL — AB (ref 8.9–10.3)
CHLORIDE: 103 mmol/L (ref 101–111)
CO2: 24 mmol/L (ref 22–32)
Creatinine, Ser: 0.78 mg/dL (ref 0.44–1.00)
GFR calc non Af Amer: >60 mL/min (ref >60)
GLUCOSE: 122 mg/dL — AB (ref 65–99)
Potassium: 2.9 mmol/L — ABNORMAL LOW (ref 3.5–5.1)
Sodium: 137 mmol/L (ref 135–145)

## 2015-09-08 LAB — URINALYSIS, ROUTINE W REFLEX MICROSCOPIC
BILIRUBIN URINE: NEGATIVE
GLUCOSE, UA: NEGATIVE mg/dL
KETONES UR: 15 mg/dL — AB
Nitrite: NEGATIVE
PH: 5.5 (ref 5.0–8.0)
Protein, ur: 30 mg/dL — AB
SPECIFIC GRAVITY, URINE: 1.015 (ref 1.005–1.030)

## 2015-09-08 LAB — URINE MICROSCOPIC-ADD ON

## 2015-09-08 LAB — PREGNANCY, URINE: PREG TEST UR: NEGATIVE

## 2015-09-08 MED ORDER — CLINDAMYCIN HCL 300 MG PO CAPS: 300.0000 mg | ORAL_CAPSULE | Freq: Three times a day (TID) | ORAL | 0 refills | Status: DC

## 2015-09-08 MED ORDER — PROBIOTIC & ACIDOPHILUS EX ST PO CAPS: 1.0000 | ORAL_CAPSULE | Freq: Every day | ORAL | 0 refills | Status: DC

## 2015-09-08 MED ORDER — KETOROLAC TROMETHAMINE 30 MG/ML IJ SOLN
30.0000 mg | Freq: Once | INTRAMUSCULAR | Status: AC
  Administered 2015-09-08: 30 mg via INTRAVENOUS
  Filled 2015-09-08: qty 1

## 2015-09-08 MED ORDER — ACETAMINOPHEN 160 MG/5ML PO SOLN
650.0000 mg | Freq: Once | ORAL | Status: AC
  Administered 2015-09-08: 650 mg via ORAL
  Filled 2015-09-08: qty 20.3

## 2015-09-08 MED ORDER — SODIUM CHLORIDE 0.9 % IV BOLUS (SEPSIS)
1000.0000 mL | Freq: Once | INTRAVENOUS | Status: AC
  Administered 2015-09-08: 1000 mL via INTRAVENOUS

## 2015-09-08 MED ORDER — DEXAMETHASONE SODIUM PHOSPHATE 10 MG/ML IJ SOLN
10.0000 mg | Freq: Once | INTRAMUSCULAR | Status: AC
  Administered 2015-09-08: 10 mg via INTRAVENOUS
  Filled 2015-09-08: qty 1

## 2015-09-08 MED ORDER — PREDNISONE 10 MG (21) PO TBPK: 10.0000 mg | ORAL_TABLET | Freq: Every day | ORAL | 0 refills | Status: DC

## 2015-09-08 MED ORDER — IOPAMIDOL (ISOVUE-300) INJECTION 61%
80.0000 mL | Freq: Once | INTRAVENOUS | Status: AC | PRN
  Administered 2015-09-08: 80 mL via INTRAVENOUS

## 2015-09-08 MED ORDER — CLINDAMYCIN PHOSPHATE 600 MG/50ML IV SOLN
600.0000 mg | Freq: Once | INTRAVENOUS | Status: AC
  Administered 2015-09-08: 600 mg via INTRAVENOUS
  Filled 2015-09-08: qty 50

## 2015-09-08 NOTE — ED Provider Notes (Signed)
[place] DE[facility] Provider Note   CSN: [id number] Arrival date & time: 09/08/15  1743  First Provider Contact:  First MD Initiated Contact with Patient 09/08/15 1757      By signing my name below, I, Julien Nordmann, attest that this documentation has been prepared under the direction and in the presence of Isla Pence, MD.  Electronically Signed: Julien Nordmann, ED Scribe. 09/08/15. 6:05 PM.   History   Chief Complaint Chief Complaint  Patient presents with  . Fever    The history is provided by the patient. No language interpreter was used.   HPI Comments: Valerie Walter is a 35 y.o. female who presents to the Emergency Department complaining of sudden onset, gradual worsening, moderate sore throat onset one week ago. She notes associated fever (TMax 101), left ear pain, and loss of appetite. She reports being seen twice in the past week where she had two strep tests done which were both negative. She was given amoxicillin and hydrocodone to alleviate her symptoms with no relief. Pt has also tried halls and chloroseptic spray to alleviate her sore throat with no relief. She denies any other complaints Past Medical History:  Diagnosis Date  . Miscarriage   . Ovarian cyst   . Seasonal allergies   . Thyroid disease   . Trichomonas   . Umbilical hernia     There are no active problems to display for this patient.   Past Surgical History:  Procedure Laterality Date  . APPENDECTOMY    . IRRIGATION AND DEBRIDEMENT ABSCESS      OB History    Gravida Para Term Preterm AB Living   5 2 2  0 3 2   SAB TAB Ectopic Multiple Live Births   2 1 0 0 2       Home Medications    Prior to Admission medications   Medication Sig Start Date End Date Taking? Authorizing Provider  albuterol (PROVENTIL HFA;VENTOLIN HFA) 108 (90 Base) MCG/ACT inhaler Inhale 1-2 puffs into the lungs every 6 (six) hours as needed for wheezing or shortness of breath. 05/29/15   Lezlie Lye, NP   amoxicillin (AMOXIL) 500 MG capsule Take 1 capsule (500 mg total) by mouth 3 (three) times daily. 09/05/15   Konrad Felix, PA  budesonide (PULMICORT FLEXHALER) 180 MCG/ACT inhaler Inhale 1 puff into the lungs 2 (two) times daily. Patient not taking: Reported on 06/26/2015 05/29/15   Lezlie Lye, NP  clindamycin (CLEOCIN) 300 MG capsule Take 1 capsule (300 mg total) by mouth 3 (three) times daily. 09/08/15   Isla Pence, MD  diphenhydrAMINE (BENADRYL) 25 MG tablet Take 25 mg by mouth every 6 (six) hours as needed for allergies.    Historical Provider, MD  HYDROcodone-acetaminophen (NORCO) 10-325 MG tablet Take 1 tablet by mouth every 6 (six) hours as needed. 09/05/15   Konrad Felix, PA  predniSONE (STERAPRED UNI-PAK 21 TAB) 10 MG (21) TBPK tablet Take 1 tablet (10 mg total) by mouth daily. Take 6 tabs by mouth daily  for 2 days, then 5 tabs for 2 days, then 4 tabs for 2 days, then 3 tabs for 2 days, 2 tabs for 2 days, then 1 tab by mouth daily for 2 days 09/08/15   Isla Pence, MD  Probiotic Product (PROBIOTIC & ACIDOPHILUS EX ST) CAPS Take 1 capsule by mouth daily. 09/08/15   Isla Pence, MD    Family History Family History  Problem Relation Age of Onset  . Cancer  Mother     Social History Social History  Substance Use Topics  . Smoking status: Current Every Day Smoker    Packs/day: 0.25  . Smokeless tobacco: Never Used  . Alcohol use Yes     Comment: occasional     Allergies   Other   Review of Systems Review of Systems  Constitutional: Positive for fever.  HENT: Positive for ear pain and sore throat.   All other systems reviewed and are negative.    Physical Exam Updated Vital Signs BP 103/67 (BP Location: Right Arm)   Pulse 71   Temp 99.3 F (37.4 C) (Oral)   Resp 18   Ht 5\' 3"  (1.6 m)   Wt 170 lb (77.1 kg)   LMP 08/15/2015 (Exact Date)   SpO2 100%   BMI 30.11 kg/m   Physical Exam  Constitutional: She is oriented to person, place, and time. She  appears well-developed and well-nourished.  HENT:  Head: Normocephalic.  Pharyngeal erythema  Eyes: EOM are normal.  Neck: Normal range of motion.  Cervical lymphadenopathy on the right  Pulmonary/Chest: Effort normal.  Abdominal: She exhibits no distension.  Musculoskeletal: Normal range of motion.  Neurological: She is alert and oriented to person, place, and time.  Psychiatric: She has a normal mood and affect.  Nursing note and vitals reviewed.    ED Treatments / Results  DIAGNOSTIC STUDIES: Oxygen Saturation is 98% on RA, norma by my interpretation.  COORDINATION OF CARE:  6:04 PM Discussed treatment plan which includes CT of neck, IV fluids and tylenol with pt at bedside and pt agreed to plan.  Labs (all labs ordered are listed, but only abnormal results are displayed) Labs Reviewed  BASIC METABOLIC PANEL - Abnormal; Notable for the following:       Result Value   Potassium 2.9 (*)    Glucose, Bld 122 (*)    Calcium 8.7 (*)    All other components within normal limits  CBC WITH DIFFERENTIAL/PLATELET - Abnormal; Notable for the following:    WBC 11.2 (*)    Neutro Abs 8.3 (*)    All other components within normal limits  URINALYSIS, ROUTINE W REFLEX MICROSCOPIC (NOT AT [facility]) - Abnormal; Notable for the following:    APPearance CLOUDY (*)    Hgb urine dipstick SMALL (*)    Ketones, ur 15 (*)    Protein, ur 30 (*)    Leukocytes, UA SMALL (*)    All other components within normal limits  URINE MICROSCOPIC-ADD ON - Abnormal; Notable for the following:    Squamous Epithelial / LPF 0-5 (*)    Bacteria, UA RARE (*)    All other components within normal limits  RAPID STREP SCREEN (NOT AT [facility])  CULTURE, GROUP A STREP [facility])  PREGNANCY, URINE    EKG  EKG Interpretation None       Radiology Ct Soft Tissue Neck W Contrast  Result Date: 09/08/2015 CLINICAL DATA:  Sore throat onset 1 week ago. Fever. LEFT ear pain. Negative strep test. EXAM: CT NECK WITH CONTRAST  TECHNIQUE: Multidetector CT imaging of the neck was performed using the standard protocol following the bolus administration of intravenous contrast. CONTRAST:  26mL ISOVUE-300 IOPAMIDOL (ISOVUE-300) INJECTION 61% COMPARISON:  None. FINDINGS: Pharynx and larynx: There is diffuse nasopharyngeal and oropharyngeal edema related to palatine tonsillar and nasopharyngeal adenoidal inflammation. Striated pattern of enhancement, is consistent with severe, bilateral acute tonsillitis. No tonsillar or peritonsillar abscess. The soft palate and uvula are swollen but  the epiglottis is normal. No inflammation of the glottis or subglottic region. No retropharyngeal fluid. Mild mass effect on the airway in the oropharyngeal and hypopharyngeal regions. Salivary glands: Normal Thyroid: Normal Lymph nodes: Marked BILATERAL reactive level II adenopathy contributes to mass effect on the airway. Vascular: Normal Limited intracranial: Normal Visualized orbits: Normal Mastoids and visualized paranasal sinuses: Negative Skeleton: Negative Upper chest: Negative IMPRESSION: Severe tonsillitis, without tonsillar or peritonsillar abscess. Advanced reactive BILATERAL level II adenopathy, contributes to mild narrowing of the oropharyngeal and hypopharyngeal airway. Inpatient surveillance and/or ENT consultation may be warranted. A call is into the ordering provider. Electronically Signed   By: Staci Righter M.D.   On: 09/08/2015 19:59   Procedures Procedures (including critical care time)  Medications Ordered in ED Medications  clindamycin (CLEOCIN) IVPB 600 mg (600 mg Intravenous New Bag/Given 09/08/15 2021)  acetaminophen (TYLENOL) solution 650 mg (650 mg Oral Given 09/08/15 1759)  sodium chloride 0.9 % bolus 1,000 mL (1,000 mLs Intravenous New Bag/Given 09/08/15 1841)  ketorolac (TORADOL) 30 MG/ML injection 30 mg (30 mg Intravenous Given 09/08/15 1850)  dexamethasone (DECADRON) injection 10 mg (10 mg Intravenous Given 09/08/15 1850)    iopamidol (ISOVUE-300) 61 % injection 80 mL (80 mLs Intravenous Contrast Given 09/08/15 1934)     Initial Impression / Assessment and Plan / ED Course  I have reviewed the triage vital signs and the nursing notes.  Pertinent labs & imaging results that were available during my care of the patient were reviewed by me and considered in my medical decision making (see chart for details).  Clinical Course   Pt feels much better after IVFs and steroids.  She has no difficulty breathing.  She is able to swallow easier.  I think she is ok for home.  I told her to return immediately if sx worsen.  She is given the number to ENT.  Final Clinical Impressions(s) / ED Diagnoses   Final diagnoses:  Tonsillitis  I personally performed the services described in this documentation, which was scribed in my presence. The recorded information has been reviewed and is accurate.  New Prescriptions New Prescriptions   CLINDAMYCIN (CLEOCIN) 300 MG CAPSULE    Take 1 capsule (300 mg total) by mouth 3 (three) times daily.   PREDNISONE (STERAPRED UNI-PAK 21 TAB) 10 MG (21) TBPK TABLET    Take 1 tablet (10 mg total) by mouth daily. Take 6 tabs by mouth daily  for 2 days, then 5 tabs for 2 days, then 4 tabs for 2 days, then 3 tabs for 2 days, 2 tabs for 2 days, then 1 tab by mouth daily for 2 days   PROBIOTIC PRODUCT (PROBIOTIC & ACIDOPHILUS EX ST) CAPS    Take 1 capsule by mouth daily.     Isla Pence, MD 09/08/15 2023

## 2015-09-08 NOTE — ED Triage Notes (Signed)
Sore throat and fever. She has had a negative strep. She is crying in pain at triage. Strep screen repeated.

## 2015-09-10 LAB — CULTURE, GROUP A STREP (THRC)

## 2015-09-12 LAB — CULTURE, GROUP A STREP (THRC)

## 2015-12-04 ENCOUNTER — Encounter (HOSPITAL_BASED_OUTPATIENT_CLINIC_OR_DEPARTMENT_OTHER): Payer: Self-pay | Admitting: Emergency Medicine

## 2015-12-04 ENCOUNTER — Emergency Department (HOSPITAL_BASED_OUTPATIENT_CLINIC_OR_DEPARTMENT_OTHER)
Admission: EM | Admit: 2015-12-04 | Discharge: 2015-12-04 | Disposition: A | Discharge status: Home / Self Care | Payer: Medicaid Other | Attending: Dermatology | Admitting: Dermatology

## 2015-12-04 DIAGNOSIS — F17200 Nicotine dependence, unspecified, uncomplicated: Secondary | ICD-10-CM | POA: Diagnosis not present

## 2015-12-04 DIAGNOSIS — J029 Acute pharyngitis, unspecified: Secondary | ICD-10-CM | POA: Diagnosis not present

## 2015-12-04 DIAGNOSIS — R220 Localized swelling, mass and lump, head: Secondary | ICD-10-CM | POA: Diagnosis present

## 2015-12-04 DIAGNOSIS — Z5321 Procedure and treatment not carried out due to patient leaving prior to being seen by health care provider: Secondary | ICD-10-CM | POA: Insufficient documentation

## 2015-12-04 LAB — RAPID STREP SCREEN (MED CTR MEBANE ONLY): Streptococcus, Group A Screen (Direct): NEGATIVE

## 2015-12-04 NOTE — ED Triage Notes (Signed)
Pt having sore throat with swelling to throat, left sided gland swelling and facial swelling.  No known fever.

## 2015-12-05 ENCOUNTER — Encounter (HOSPITAL_BASED_OUTPATIENT_CLINIC_OR_DEPARTMENT_OTHER): Payer: Self-pay | Admitting: *Deleted

## 2015-12-05 ENCOUNTER — Emergency Department (HOSPITAL_BASED_OUTPATIENT_CLINIC_OR_DEPARTMENT_OTHER)
Admission: EM | Admit: 2015-12-05 | Discharge: 2015-12-05 | Disposition: A | Discharge status: Home / Self Care | Payer: Medicaid Other | Attending: Dermatology | Admitting: Dermatology

## 2015-12-05 DIAGNOSIS — R509 Fever, unspecified: Secondary | ICD-10-CM | POA: Insufficient documentation

## 2015-12-05 DIAGNOSIS — Z5321 Procedure and treatment not carried out due to patient leaving prior to being seen by health care provider: Secondary | ICD-10-CM | POA: Insufficient documentation

## 2015-12-05 DIAGNOSIS — F17200 Nicotine dependence, unspecified, uncomplicated: Secondary | ICD-10-CM | POA: Insufficient documentation

## 2015-12-05 NOTE — ED Notes (Signed)
Went to room to see pt, pt had left the room.

## 2015-12-05 NOTE — ED Notes (Addendum)
States she may have to leave because her daughter has a doctors appointment.

## 2015-12-05 NOTE — ED Triage Notes (Signed)
Chills, fever, swollen glands, sore throat and facial swelling x 3 days. She came here yesterday but left because she was hungry.

## 2015-12-05 NOTE — ED Notes (Signed)
States she has to get back to see the doctor now. She is scared. Crying. VS reevaluated. Pt moved to triage room to wait for ready exam room in back. No change is VS.

## 2015-12-06 ENCOUNTER — Ambulatory Visit (HOSPITAL_COMMUNITY)
Admission: EM | Admit: 2015-12-06 | Discharge: 2015-12-06 | Disposition: A | Discharge status: Home / Self Care | Payer: Medicaid Other | Attending: Emergency Medicine | Admitting: Emergency Medicine

## 2015-12-06 ENCOUNTER — Encounter (HOSPITAL_COMMUNITY): Payer: Self-pay | Admitting: Emergency Medicine

## 2015-12-06 DIAGNOSIS — B028 Zoster with other complications: Secondary | ICD-10-CM

## 2015-12-06 DIAGNOSIS — L03211 Cellulitis of face: Secondary | ICD-10-CM

## 2015-12-06 LAB — CULTURE, GROUP A STREP (THRC)

## 2015-12-06 MED ORDER — ACYCLOVIR 200 MG/5ML PO SUSP: 800.0000 mg | Freq: Every day | ORAL | 0 refills | Status: AC

## 2015-12-06 MED ORDER — HYDROCODONE-ACETAMINOPHEN 5-325 MG PO TABS: 1.0000 | ORAL_TABLET | Freq: Four times a day (QID) | ORAL | 0 refills | Status: DC | PRN

## 2015-12-06 MED ORDER — CEPHALEXIN 250 MG/5ML PO SUSR: 500.0000 mg | Freq: Three times a day (TID) | ORAL | 0 refills | Status: AC

## 2015-12-06 NOTE — ED Triage Notes (Signed)
The patient presented to the [facility] with a complaint of a rash on the left side of her face for 3 days. The patient reported pain and itching.

## 2015-12-06 NOTE — Discharge Instructions (Signed)
Start Acyclovir (anti-viral) 5 times a day as directed. Take Keflex (antibiotic) 3 times a day as directed for infection. Wash area with soap and water- no harsh chemicals. Take Vicodin every 6 hours as needed for pain. Follow-up with your primary care provider in 3 to 4 days if not improving or go to ER if rash spreads near your eye.

## 2015-12-06 NOTE — ED Notes (Signed)
Painful rash to left side of face.  Strongly encouraged patient return immediately if rash gets any closer to her eye.  Patient agreed.

## 2015-12-06 NOTE — ED Provider Notes (Signed)
CSN: [id number]     Arrival date & time 12/06/15  1230 History   First MD Initiated Contact with Patient 12/06/15 1359     Chief Complaint  Patient presents with  . Rash   (Consider location/radiation/quality/duration/timing/severity/associated sxs/prior Treatment) 35 year old female presents with fever, left sided facial rash that started 2 days ago. Started with headache and slight tingling of left side of face near hairline. Then experiencing fever up to 103 yesterday and rash started. Itchy and now painful. Tried applying rubbing alcohol to area and A & D ointment with minimal relief. No other URI symptoms. Does have a history of chicken pox as a child. Has been under increased stress lately and has a presentation in 2 days and concerned about appearing before multiple people with this rash. No chronic health issues and takes no daily medication.       Past Medical History:  Diagnosis Date  . Miscarriage   . Ovarian cyst   . Seasonal allergies   . Thyroid disease   . Trichomonas   . Umbilical hernia    Past Surgical History:  Procedure Laterality Date  . APPENDECTOMY    . IRRIGATION AND DEBRIDEMENT ABSCESS     Family History  Problem Relation Age of Onset  . Cancer Mother    Social History  Substance Use Topics  . Smoking status: Current Every Day Smoker    Packs/day: 0.25  . Smokeless tobacco: Never Used  . Alcohol use Yes     Comment: occasional   OB History    Gravida Para Term Preterm AB Living   5 2 2  0 3 2   SAB TAB Ectopic Multiple Live Births   2 1 0 0 2     Review of Systems  Constitutional: Positive for chills, fatigue and fever.  HENT: Positive for facial swelling. Negative for congestion, ear discharge, ear pain, hearing loss, mouth sores, rhinorrhea and sore throat.   Eyes: Negative for discharge and visual disturbance.  Respiratory: Negative for cough, chest tightness and shortness of breath.   Cardiovascular: Negative for chest pain.    Gastrointestinal: Negative for abdominal pain, diarrhea, nausea and vomiting.  Musculoskeletal: Negative for neck pain and neck stiffness.  Skin: Positive for rash.  Allergic/Immunologic: Negative for immunocompromised state.  Neurological: Positive for headaches. Negative for dizziness, weakness, light-headedness and numbness.    Allergies  Other  Home Medications   Prior to Admission medications   Medication Sig Start Date End Date Taking? Authorizing Provider  ibuprofen (ADVIL,MOTRIN) 800 MG tablet Take 800 mg by mouth every 8 (eight) hours as needed.   Yes Historical Provider, MD  acyclovir (ZOVIRAX) 200 MG/5ML suspension Take 20 mLs (800 mg total) by mouth 5 (five) times daily. 12/06/15 12/13/15  Katy Apo, NP  cephALEXin (KEFLEX) 250 MG/5ML suspension Take 10 mLs (500 mg total) by mouth 3 (three) times daily. 12/06/15 12/13/15  Katy Apo, NP  HYDROcodone-acetaminophen (NORCO/VICODIN) 5-325 MG tablet Take 1-2 tablets by mouth every 6 (six) hours as needed for severe pain. 12/06/15   Katy Apo, NP   Meds Ordered and Administered this Visit  Medications - No data to display  BP 110/76 (BP Location: Left Arm)   Pulse 66   Temp 100.1 F (37.8 C) (Oral)   Resp 18   LMP 12/06/2015 (Exact Date)   SpO2 100%  No data found.   Physical Exam  Constitutional: She is oriented to person, place, and time. She appears well-developed and  well-nourished. She appears ill.  Patient is teary- eyed in exam room.  HENT:  Head: Normocephalic and atraumatic. Head is without left periorbital erythema. Hair is normal.    Right Ear: Hearing, tympanic membrane, external ear and ear canal normal.  Left Ear: Hearing, tympanic membrane, external ear and ear canal normal.  Nose: Nose normal.  Mouth/Throat: Uvula is midline, oropharynx is clear and moist and mucous membranes are normal.  Papular to vesicular rash present from mid-jaw line to left upper forehead area. Rash is not near  her eye but is present pre-auricular. No lesions seen on ear. Some lesions are pustular with white to yellow fluid. No crusting seen. Erythematous base and swelling present. Very tender to palpation.   Eyes: Conjunctivae and EOM are normal. Pupils are equal, round, and reactive to light.  Neck: Normal range of motion. Neck supple.  Cardiovascular: Normal rate, regular rhythm and normal heart sounds.   Pulmonary/Chest: Effort normal and breath sounds normal.  Lymphadenopathy:       Head (left side): Preauricular and posterior auricular adenopathy present.    She has cervical adenopathy.       Left cervical: Superficial cervical and deep cervical adenopathy present.  Neurological: She is alert and oriented to person, place, and time. She has normal strength. No cranial nerve deficit or sensory deficit.  Skin: Skin is warm and dry. Capillary refill takes less than 2 seconds. Rash noted. Rash is papular, pustular and vesicular. There is erythema.  Psychiatric: She has a normal mood and affect. Her behavior is normal. Judgment and thought content normal.    Urgent Care Course   Clinical Course    Procedures (including critical care time)  Labs Review Labs Reviewed - No data to display  Imaging Review No results found.   Visual Acuity Review  Right Eye Distance:   Left Eye Distance:   Bilateral Distance:    Right Eye Near:   Left Eye Near:    Bilateral Near:         MDM   1. Cellulitis of face   2. Herpes zoster with other complication    Consulted with Linde Gillis, PA. Discussed with patient that she appears to have shingles with secondary bacterial skin infection. Patient requests liquid medication since unable to swallow pills.  Recommend start Acyclovir (anti-viral) 5 times a day as directed. Take Keflex  3 times a day as directed for infection. Wash area with soap and water- no harsh chemicals. Take Vicodin 1-2 tablets every 6 hours as needed for pain #12 no refill.  (patient will try to swallow these pills). Follow-up with her primary care provider in 3 to 4 days if not improving or go to ER if rash spreads closer to her eye or into her ear or if pain becomes more severe.       Katy Apo, NP 12/07/15 1005

## 2015-12-07 ENCOUNTER — Emergency Department (HOSPITAL_COMMUNITY)
Admission: EM | Admit: 2015-12-07 | Discharge: 2015-12-07 | Disposition: A | Discharge status: Home / Self Care | Payer: Medicaid Other | Attending: Emergency Medicine | Admitting: Emergency Medicine

## 2015-12-07 ENCOUNTER — Encounter (HOSPITAL_COMMUNITY): Payer: Self-pay

## 2015-12-07 DIAGNOSIS — B029 Zoster without complications: Secondary | ICD-10-CM | POA: Diagnosis not present

## 2015-12-07 DIAGNOSIS — L089 Local infection of the skin and subcutaneous tissue, unspecified: Secondary | ICD-10-CM | POA: Diagnosis present

## 2015-12-07 DIAGNOSIS — F17200 Nicotine dependence, unspecified, uncomplicated: Secondary | ICD-10-CM | POA: Diagnosis not present

## 2015-12-07 MED ORDER — PREDNISONE 20 MG PO TABS
60.0000 mg | ORAL_TABLET | Freq: Once | ORAL | Status: AC
  Administered 2015-12-07: 60 mg via ORAL
  Filled 2015-12-07: qty 3

## 2015-12-07 MED ORDER — VALACYCLOVIR HCL 500 MG PO TABS
1000.0000 mg | ORAL_TABLET | Freq: Once | ORAL | Status: AC
  Administered 2015-12-07: 1000 mg via ORAL
  Filled 2015-12-07: qty 2

## 2015-12-07 MED ORDER — PREDNISONE 20 MG PO TABS: ORAL_TABLET | ORAL | 0 refills | Status: DC

## 2015-12-07 NOTE — ED Notes (Signed)
Valtrex requested from pharmacy

## 2015-12-07 NOTE — ED Provider Notes (Signed)
[facility] DEPT Provider Note   CSN: [id number] Arrival date & time: 12/07/15  1038     History   Chief Complaint Chief Complaint  Patient presents with  . Recurrent Skin Infections    HPI Valerie Walter is a 35 y.o. female.  Patient presents w several day hx painful, localized rash to left side of face. Symptoms acute onset, persistent, without any acute or abrupt worsening in past day.  Was seen at urgent care yesterday, but indicates prescription was too expensive, so did not start on any of those meds - was diagnosed w shingles then.   Patient denies fever. No chills/sweats. No new home or personal products. No new meds. Denies eye pain, redness or change in vision. No hearing loss, ear pain or tinnitus.    The history is provided by the patient.    Past Medical History:  Diagnosis Date  . Miscarriage   . Ovarian cyst   . Seasonal allergies   . Thyroid disease   . Trichomonas   . Umbilical hernia     There are no active problems to display for this patient.   Past Surgical History:  Procedure Laterality Date  . APPENDECTOMY    . IRRIGATION AND DEBRIDEMENT ABSCESS      OB History    Gravida Para Term Preterm AB Living   5 2 2  0 3 2   SAB TAB Ectopic Multiple Live Births   2 1 0 0 2       Home Medications    Prior to Admission medications   Medication Sig Start Date End Date Taking? Authorizing Provider  diphenhydrAMINE (BENADRYL) 25 MG tablet Take 50 mg by mouth every 6 (six) hours as needed (rash on face).   Yes Historical Provider, MD  ibuprofen (ADVIL,MOTRIN) 800 MG tablet Take 800 mg by mouth every 8 (eight) hours as needed.   Yes Historical Provider, MD  Vitamins A & D (VITAMIN A & D) ointment Apply 1 application topically as needed (rash on face).   Yes Historical Provider, MD  acyclovir (ZOVIRAX) 200 MG/5ML suspension Take 20 mLs (800 mg total) by mouth 5 (five) times daily. Patient not taking: Reported on 12/07/2015 12/06/15 12/13/15  Katy Apo, NP  cephALEXin [facility]) 250 MG/5ML suspension Take 10 mLs (500 mg total) by mouth 3 (three) times daily. Patient not taking: Reported on 12/07/2015 12/06/15 12/13/15  Katy Apo, NP  HYDROcodone-acetaminophen (NORCO/VICODIN) 5-325 MG tablet Take 1-2 tablets by mouth every 6 (six) hours as needed for severe pain. Patient not taking: Reported on 12/07/2015 12/06/15   Katy Apo, NP    Family History Family History  Problem Relation Age of Onset  . Cancer Mother     Social History Social History  Substance Use Topics  . Smoking status: Current Every Day Smoker    Packs/day: 0.25  . Smokeless tobacco: Never Used  . Alcohol use Yes     Comment: occasional     Allergies   Other   Review of Systems Review of Systems  Constitutional: Negative for fever.  Eyes: Negative for pain, discharge, redness and visual disturbance.  Respiratory: Negative for cough.   Gastrointestinal: Negative for vomiting.  Skin: Positive for rash.  Neurological: Negative for headaches.     Physical Exam Updated Vital Signs BP 126/88 (BP Location: Right Arm)   Pulse 69   Temp 98.4 F (36.9 C) (Oral)   Resp 16   Ht 5\' 3"  (1.6 m)  Wt 80.3 kg   LMP 12/05/2015   BMI 31.35 kg/m   Physical Exam  Constitutional: She appears well-developed and well-nourished. No distress.  HENT:  Mouth/Throat: Oropharynx is clear and moist.  Left eac clear, tm normal.   Eyes: Conjunctivae and EOM are normal. Pupils are equal, round, and reactive to light. No scleral icterus.  Conj not injected. No corneal lesions.   Neck: Neck supple. No tracheal deviation present.  No stiffness or rigidity  Cardiovascular: Normal rate.   Pulmonary/Chest: Effort normal. No respiratory distress.  Abdominal: Normal appearance.  Musculoskeletal: She exhibits no edema.  Lymphadenopathy:    She has no cervical adenopathy.  Neurological: She is alert.  Skin: Skin is warm and dry. Rash noted. She is not  diaphoretic.  Erythematous vesicular rash left face. Single dermatome, c/w shingles. There is mildly surrounding erythema to a few of the vesicle clusters.   Psychiatric: She has a normal mood and affect.  Nursing note and vitals reviewed.    ED Treatments / Results  Labs (all labs ordered are listed, but only abnormal results are displayed) Labs Reviewed - No data to display  EKG  EKG Interpretation None       Radiology No results found.  Procedures Procedures (including critical care time)  Medications Ordered in ED Medications  predniSONE (DELTASONE) tablet 60 mg (not administered)  valACYclovir (VALTREX) tablet 1,000 mg (not administered)     Initial Impression / Assessment and Plan / ED Course  I have reviewed the triage vital signs and the nursing notes.  Pertinent labs & imaging results that were available during my care of the patient were reviewed by me and considered in my medical decision making (see chart for details).  Clinical Course   Exam c/w shingles.  Given surround erythema, possible superimposed mild cellulitis - on review records, patient had been give rx keflex yesterday, as well as acyclovir - pt has not yet filled.   Patient requesting meds in ED.  Valtrex po, pred for symptom relief.   Will discuss with CM possible assistance with home meds.   Cm indicates they have gotten patient 'match', confirmed medicaid, and that pt can get her prescriptions filled yesterday at affordable price.  Will also give rx pred.    Final Clinical Impressions(s) / ED Diagnoses   Final diagnoses:  None    New Prescriptions New Prescriptions   No medications on file     Lajean Saver, MD 12/07/15 1237

## 2015-12-07 NOTE — Discharge Planning (Signed)
ED CM consulted by EDP Steinl for medication assistance  NCM reviewed chart review information and spoke with the pt about [facility] MATCH program ($3 co pay for each Rx through [facility] program, does not include refills, 7 day expiration of MATCH letter and choice of pharmacies). Pt is eligible for [facility] MATCH program (unable to find pt listed in PDMI per cardholder name inquiry) and has agreed to accept [facility] under terms discussed. PDMI information entered. [place] letter completed and provided to pt.  NCM updated EDP and ED RN.   NCM also confirmed that pt does not have PCP. NCM discussed and provided written information for uninsured PCP and the importance of PCP for f/u care.

## 2015-12-07 NOTE — ED Notes (Signed)
Case management to bedside

## 2015-12-07 NOTE — ED Triage Notes (Signed)
Pt here and reports she has a rash/skin infection that she was seen for yesterday. Pt reports swelling is wrose today. Swelling and redness noted to left side of face under the left eye and on the left side of the forehead into the scalp.

## 2015-12-07 NOTE — Discharge Instructions (Signed)
It was our pleasure to provide your ER care today - we hope that you feel better.  Keep area very clean/dry.  Take your prescriptions given yesterday as prescribed.  You may also take prednisone as prescribed.  Follow up with primary care doctor.  Return to ER if worse, new symptoms, high fevers, spreading redness, severe eye pain/redness, other concern.

## 2016-01-01 ENCOUNTER — Emergency Department (HOSPITAL_BASED_OUTPATIENT_CLINIC_OR_DEPARTMENT_OTHER)
Admission: EM | Admit: 2016-01-01 | Discharge: 2016-01-01 | Disposition: A | Discharge status: Home / Self Care | Payer: Medicaid Other | Attending: Emergency Medicine | Admitting: Emergency Medicine

## 2016-01-01 ENCOUNTER — Encounter (HOSPITAL_BASED_OUTPATIENT_CLINIC_OR_DEPARTMENT_OTHER): Payer: Self-pay | Admitting: *Deleted

## 2016-01-01 DIAGNOSIS — H6011 Cellulitis of right external ear: Secondary | ICD-10-CM | POA: Diagnosis not present

## 2016-01-01 DIAGNOSIS — F17210 Nicotine dependence, cigarettes, uncomplicated: Secondary | ICD-10-CM | POA: Insufficient documentation

## 2016-01-01 DIAGNOSIS — H9201 Otalgia, right ear: Secondary | ICD-10-CM | POA: Diagnosis present

## 2016-01-01 MED ORDER — PREDNISONE 10 MG (21) PO TBPK: 10.0000 mg | ORAL_TABLET | Freq: Every day | ORAL | 0 refills | Status: DC

## 2016-01-01 MED ORDER — IBUPROFEN 800 MG PO TABS: 800.0000 mg | ORAL_TABLET | Freq: Three times a day (TID) | ORAL | 0 refills | Status: DC | PRN

## 2016-01-01 MED ORDER — CLINDAMYCIN HCL 300 MG PO CAPS: 300.0000 mg | ORAL_CAPSULE | Freq: Four times a day (QID) | ORAL | 0 refills | Status: DC

## 2016-01-01 MED FILL — CLINDAMYCIN HCL 300 MG CAPS: 300 | 7 days supply | Qty: 28 | Fill #0

## 2016-01-01 MED FILL — predniSONE 10 MG TABS: 10 | 6 days supply | Qty: 21 | Fill #0

## 2016-01-01 NOTE — ED Triage Notes (Signed)
Right ear pain and swelling x 3 days. Knot behind ear

## 2016-01-01 NOTE — ED Provider Notes (Signed)
[facility] DEPT [facility] Provider Note   CSN: [id number] Arrival date & time: 01/01/16  0900     History   Chief Complaint Chief Complaint  Patient presents with  . Otalgia    HPI Valerie Walter is a 35 y.o. female.  HPI   Pt p/w 3 days of right ear pain, itching, and swelling.  The pain is described as a tightness, pain began on the outside of the ear and is moving into the ear canal.  It is also pruritic.  Has associated sore "knot" posterior to the ear.  Recently had herpes zoster on the left side of the face that felt similar initially.   No discharge from the ear. Denies fevers, chills, URI symptoms, dental pain.  Denies any exposures to chemicals or change in personal care products.  She does have allergy to earrings and stopped wearing earrings 7 days ago due to purulent discharge from the pierced area, bilaterally.    Past Medical History:  Diagnosis Date  . Miscarriage   . Ovarian cyst   . Seasonal allergies   . Thyroid disease   . Trichomonas   . Umbilical hernia     There are no active problems to display for this patient.   Past Surgical History:  Procedure Laterality Date  . APPENDECTOMY    . IRRIGATION AND DEBRIDEMENT ABSCESS      OB History    Gravida Para Term Preterm AB Living   5 2 2  0 3 2   SAB TAB Ectopic Multiple Live Births   2 1 0 0 2       Home Medications    Prior to Admission medications   Medication Sig Start Date End Date Taking? Authorizing Provider  clindamycin (CLEOCIN) 300 MG capsule Take 1 capsule (300 mg total) by mouth 4 (four) times daily. X 7 days 01/01/16   Clayton Bibles, PA-C  diphenhydrAMINE (BENADRYL) 25 MG tablet Take 50 mg by mouth every 6 (six) hours as needed (rash on face).    Historical Provider, MD  HYDROcodone-acetaminophen (NORCO/VICODIN) 5-325 MG tablet Take 1-2 tablets by mouth every 6 (six) hours as needed for severe pain. Patient not taking: Reported on 12/07/2015 12/06/15   Katy Apo, NP  ibuprofen  (ADVIL,MOTRIN) 800 MG tablet Take 1 tablet (800 mg total) by mouth every 8 (eight) hours as needed. 01/01/16   Clayton Bibles, PA-C  predniSONE (STERAPRED UNI-PAK 21 TAB) 10 MG (21) TBPK tablet Take 1 tablet (10 mg total) by mouth daily. Day 1: take 6 tabs.  Day 2: 5 tabs  Day 3: 4 tabs  Day 4: 3 tabs  Day 5: 2 tabs  Day 6: 1 tab 01/01/16   Clayton Bibles, PA-C  Vitamins A & D (VITAMIN A & D) ointment Apply 1 application topically as needed (rash on face).    Historical Provider, MD    Family History Family History  Problem Relation Age of Onset  . Cancer Mother     Social History Social History  Substance Use Topics  . Smoking status: Current Every Day Smoker    Packs/day: 0.25    Types: Cigarettes  . Smokeless tobacco: Never Used  . Alcohol use Yes     Comment: occasional     Allergies   Other   Review of Systems Review of Systems  All other systems reviewed and are negative.    Physical Exam Updated Vital Signs BP 119/80 (BP Location: Right Arm)   Pulse 60  Temp 98.5 F (36.9 C) (Oral)   Resp 18   Ht 5\' 3"  (1.6 m)   Wt 79.4 kg   LMP 12/05/2015   SpO2 100%   BMI 31.00 kg/m   Physical Exam  Constitutional: She appears well-developed and well-nourished. No distress.  HENT:  Head: Normocephalic and atraumatic.  Nose: Mucosal edema present. No rhinorrhea. Right sinus exhibits no maxillary sinus tenderness and no frontal sinus tenderness. Left sinus exhibits no maxillary sinus tenderness and no frontal sinus tenderness.  Mouth/Throat: Uvula is midline and oropharynx is clear and moist. Mucous membranes are not dry. No trismus in the jaw. No uvula swelling. No oropharyngeal exudate, posterior oropharyngeal edema, posterior oropharyngeal erythema or tonsillar abscesses.  Right external ear diffusely erythematous, edematous, warm, tender to palpation.  No induration or fluctuance.  No active drainage from earlobe.  Canal without significant edema, no discharge.  Right TM is  normal.  Tenderness and edema anterior to right ear.    Left face and ear without rash.  Left external ear and TM normal.    Eyes: Conjunctivae and EOM are normal.  Neck: Normal range of motion. Neck supple.  Cardiovascular: Normal rate and regular rhythm.   Pulmonary/Chest: Effort normal and breath sounds normal. No respiratory distress. She has no wheezes. She has no rales.  Neurological: She is alert.  Skin: No rash noted. She is not diaphoretic.  Nursing note and vitals reviewed.    ED Treatments / Results  Labs (all labs ordered are listed, but only abnormal results are displayed) Labs Reviewed - No data to display  EKG  EKG Interpretation None       Radiology No results found.  Procedures Procedures (including critical care time)  Medications Ordered in ED Medications - No data to display   Initial Impression / Assessment and Plan / ED Course  I have reviewed the triage vital signs and the nursing notes.  Pertinent labs & imaging results that were available during my care of the patient were reviewed by me and considered in my medical decision making (see chart for details).  Clinical Course     Afebrile, nontoxic patient with right ear edema, pain, pruritis.  Cellulitis vs allergic reaction.  Doubt zoster.  No e/o abscess.  Prior diagnosed zoster on left face has resolved.  Pt also examined by Dr Darl Householder who agrees with diagnosis and recommends clindamycin and prednisone.   D/C home with prescriptions as described, +motrin, 2 day recheck.   Discussed result, findings, treatment, and follow up  with patient.  Pt given return precautions.  Pt verbalizes understanding and agrees with plan.       Final Clinical Impressions(s) / ED Diagnoses   Final diagnoses:  Cellulitis of auricle of right ear    New Prescriptions Discharge Medication List as of 01/01/2016  9:59 AM    START taking these medications   Details  clindamycin (CLEOCIN) 300 MG capsule Take 1 capsule  (300 mg total) by mouth 4 (four) times daily. X 7 days, Starting Mon 01/01/2016, Print    predniSONE (STERAPRED UNI-PAK 21 TAB) 10 MG (21) TBPK tablet Take 1 tablet (10 mg total) by mouth daily. Day 1: take 6 tabs.  Day 2: 5 tabs  Day 3: 4 tabs  Day 4: 3 tabs  Day 5: 2 tabs  Day 6: 1 tab, Starting Mon 01/01/2016, Print         [place], PA-C 01/01/16 1019    Drenda Freeze, MD 01/01/16 1540

## 2016-01-01 NOTE — Discharge Instructions (Signed)
Read the information below.  Use the prescribed medication as directed.  Please discuss all new medications with your pharmacist.  You may return to the Emergency Department at any time for worsening condition or any new symptoms that concern you.   If you develop increased redness, swelling, pus draining from the wound, uncontrolled pain, or fevers greater than 100.4, return to the ER immediately for a recheck.    Please see your doctor or return to the Emergency Department in 2 days for a recheck.  If worsening, please return sooner.

## 2016-02-08 ENCOUNTER — Inpatient Hospital Stay (HOSPITAL_COMMUNITY)
Admission: AD | Admit: 2016-02-08 | Discharge: 2016-02-08 | Disposition: A | Discharge status: Home / Self Care | Payer: Medicaid Other | Source: Ambulatory Visit | Attending: Obstetrics & Gynecology | Admitting: Obstetrics & Gynecology

## 2016-02-08 ENCOUNTER — Encounter (HOSPITAL_COMMUNITY): Payer: Self-pay | Admitting: *Deleted

## 2016-02-08 DIAGNOSIS — A5901 Trichomonal vulvovaginitis: Secondary | ICD-10-CM

## 2016-02-08 DIAGNOSIS — R110 Nausea: Secondary | ICD-10-CM

## 2016-02-08 DIAGNOSIS — R102 Pelvic and perineal pain: Secondary | ICD-10-CM | POA: Diagnosis not present

## 2016-02-08 DIAGNOSIS — F17210 Nicotine dependence, cigarettes, uncomplicated: Secondary | ICD-10-CM | POA: Diagnosis not present

## 2016-02-08 DIAGNOSIS — R112 Nausea with vomiting, unspecified: Secondary | ICD-10-CM | POA: Diagnosis present

## 2016-02-08 DIAGNOSIS — K611 Rectal abscess: Secondary | ICD-10-CM | POA: Diagnosis not present

## 2016-02-08 DIAGNOSIS — K5229 Other allergic and dietetic gastroenteritis and colitis: Secondary | ICD-10-CM | POA: Diagnosis not present

## 2016-02-08 DIAGNOSIS — E079 Disorder of thyroid, unspecified: Secondary | ICD-10-CM | POA: Insufficient documentation

## 2016-02-08 DIAGNOSIS — Z91018 Allergy to other foods: Secondary | ICD-10-CM | POA: Insufficient documentation

## 2016-02-08 DIAGNOSIS — A599 Trichomoniasis, unspecified: Secondary | ICD-10-CM | POA: Insufficient documentation

## 2016-02-08 DIAGNOSIS — Z8719 Personal history of other diseases of the digestive system: Secondary | ICD-10-CM

## 2016-02-08 LAB — URINALYSIS, ROUTINE W REFLEX MICROSCOPIC
Bilirubin Urine: NEGATIVE
GLUCOSE, UA: NEGATIVE mg/dL
HGB URINE DIPSTICK: NEGATIVE
KETONES UR: NEGATIVE mg/dL
NITRITE: NEGATIVE
PH: 6 (ref 5.0–8.0)
PROTEIN: 30 mg/dL — AB
Specific Gravity, Urine: 1.029 (ref 1.005–1.030)

## 2016-02-08 LAB — WET PREP, GENITAL
Sperm: NONE SEEN
Yeast Wet Prep HPF POC: NONE SEEN

## 2016-02-08 LAB — CBC
HCT: 39.8 % (ref 36.0–46.0)
HEMOGLOBIN: 13.4 g/dL (ref 12.0–15.0)
MCH: 28.8 pg (ref 26.0–34.0)
MCHC: 33.7 g/dL (ref 30.0–36.0)
MCV: 85.4 fL (ref 78.0–100.0)
PLATELETS: 297 10*3/uL (ref 150–400)
RBC: 4.66 MIL/uL (ref 3.87–5.11)
RDW: 15.4 % (ref 11.5–15.5)
WBC: 7.9 10*3/uL (ref 4.0–10.5)

## 2016-02-08 LAB — RAPID URINE DRUG SCREEN, HOSP PERFORMED
AMPHETAMINES: NOT DETECTED
Barbiturates: NOT DETECTED
Benzodiazepines: NOT DETECTED
COCAINE: NOT DETECTED
OPIATES: NOT DETECTED
TETRAHYDROCANNABINOL: POSITIVE — AB

## 2016-02-08 LAB — POCT PREGNANCY, URINE: PREG TEST UR: NEGATIVE

## 2016-02-08 MED ORDER — SULFAMETHOXAZOLE-TRIMETHOPRIM 800-160 MG PO TABS: 1.0000 | ORAL_TABLET | Freq: Two times a day (BID) | ORAL | 1 refills | Status: DC

## 2016-02-08 MED ORDER — IBUPROFEN 600 MG PO TABS: 600.0000 mg | ORAL_TABLET | Freq: Four times a day (QID) | ORAL | 1 refills | Status: DC | PRN

## 2016-02-08 MED ORDER — PROMETHAZINE HCL 25 MG PO TABS: 12.5000 mg | ORAL_TABLET | Freq: Four times a day (QID) | ORAL | 0 refills | Status: DC | PRN

## 2016-02-08 MED ORDER — METRONIDAZOLE 500 MG PO TABS: ORAL_TABLET | ORAL | 1 refills | Status: DC

## 2016-02-08 MED ORDER — ONDANSETRON 8 MG PO TBDP
8.0000 mg | ORAL_TABLET | Freq: Once | ORAL | Status: AC
  Administered 2016-02-08: 8 mg via ORAL
  Filled 2016-02-08: qty 1

## 2016-02-08 NOTE — Progress Notes (Signed)
Discussed discharge instructions with patient.  Went over prescriptions with patient.  She states she understands the plan of care.  Printed AVS given to patient.

## 2016-02-08 NOTE — MAU Note (Addendum)
Pt C/O nausea for the last 2 days, denies vomiting, has been having diarrhea since yesterday - 2 stools in the last 24 hours.  Also intermittent sharp abdominal pain. Pt is on depo.

## 2016-02-08 NOTE — MAU Provider Note (Signed)
Chief Complaint: Nausea; Diarrhea; and Abdominal Pain   First Provider Initiated Contact with Patient 02/08/16 0914      SUBJECTIVE HPI: Valerie Walter is a 35 y.o. [id number] who presents to maternity admissions reporting onset of nausea without vomiting, abdominal cramping, and constipation then diarrhea 3 days ago. She also indicates that she has frequent abscesses in the rectal area/gluteal fold and she had one surgically drained in 2011.  She reports she cannot eat anything because she feels so nauseous but has not vomited. She started with constipation 3 days ago then had loose stools yesterday and today. She reports 2 stools in last 24 hours.  Her abdominal pain feels like menstrual cramps and is intermittent and unchanged since onset.  Her symptoms are also associated with h/a and fatigue.  She tried ibuprofen 200 mg yesterday which did help with h/a and abdominal pain. She has not taken any medication today.  She is using Depo Provera for contraception, with her next injection at [facility] scheduled January 4. She denies vaginal bleeding, vaginal itching/burning, urinary symptoms, dizziness,  or fever/chills.     HPI  Past Medical History:  Diagnosis Date  . Miscarriage   . Ovarian cyst   . Seasonal allergies   . Thyroid disease   . Trichomonas   . Umbilical hernia    Past Surgical History:  Procedure Laterality Date  . APPENDECTOMY    . DILATION AND CURETTAGE OF UTERUS    . IRRIGATION AND DEBRIDEMENT ABSCESS     Social History   Social History  . Marital status: Single    Spouse name: N/A  . Number of children: N/A  . Years of education: N/A   Occupational History  . Not on file.   Social History Main Topics  . Smoking status: Current Every Day Smoker    Packs/day: 3.00    Types: Cigarettes  . Smokeless tobacco: Never Used  . Alcohol use Yes     Comment: occasional  . Drug use: No  . Sexual activity: Yes    Birth control/ protection: Injection     Comment: depo 3  months ago   Other Topics Concern  . Not on file   Social History Narrative  . No narrative on file   No current facility-administered medications on file prior to encounter.    Current Outpatient Prescriptions on File Prior to Encounter  Medication Sig Dispense Refill  . clindamycin (CLEOCIN) 300 MG capsule Take 1 capsule (300 mg total) by mouth 4 (four) times daily. X 7 days (Patient not taking: Reported on 02/08/2016) 28 capsule 0  . HYDROcodone-acetaminophen (NORCO/VICODIN) 5-325 MG tablet Take 1-2 tablets by mouth every 6 (six) hours as needed for severe pain. (Patient not taking: Reported on 12/07/2015) 12 tablet 0  . ibuprofen (ADVIL,MOTRIN) 800 MG tablet Take 1 tablet (800 mg total) by mouth every 8 (eight) hours as needed. (Patient not taking: Reported on 02/08/2016) 21 tablet 0  . predniSONE (STERAPRED UNI-PAK 21 TAB) 10 MG (21) TBPK tablet Take 1 tablet (10 mg total) by mouth daily. Day 1: take 6 tabs.  Day 2: 5 tabs  Day 3: 4 tabs  Day 4: 3 tabs  Day 5: 2 tabs  Day 6: 1 tab (Patient not taking: Reported on 02/08/2016) 21 tablet 0   Allergies  Allergen Reactions  . Other Hives    All nuts    ROS:  Review of Systems  Constitutional: Negative for chills, fatigue and fever.  Respiratory: Negative for  shortness of breath.   Cardiovascular: Negative for chest pain.  Gastrointestinal: Positive for abdominal pain, constipation, diarrhea and nausea. Negative for vomiting.  Genitourinary: Positive for pelvic pain. Negative for difficulty urinating, dysuria, flank pain, vaginal bleeding, vaginal discharge and vaginal pain.  Neurological: Negative for dizziness and headaches.  Psychiatric/Behavioral: Negative.      I have reviewed patient's Past Medical Hx, Surgical Hx, Family Hx, Social Hx, medications and allergies.   Physical Exam  Patient Vitals for the past 24 hrs:  BP Temp Temp src Pulse Resp Height Weight  02/08/16 1028 112/67 - - (!) 51 16 - -  02/08/16 0812 113/78  98.4 F (36.9 C) Oral 65 18 5\' 3"  (1.6 m) 170 lb (77.1 kg)   Constitutional: Well-developed, well-nourished female in no acute distress.  Cardiovascular: normal rate Respiratory: normal effort GI: Abd soft, non-tender. Pos BS x 4 MS: Extremities nontender, no edema, normal ROM Neurologic: Alert and oriented x 4.  GU: Neg CVAT.  PELVIC EXAM: Cervix pink, visually closed, without lesion, scant white creamy discharge, vaginal walls and external genitalia normal Bimanual exam: Cervix 0/long/high, firm, anterior, neg CMT, uterus nontender, nonenlarged, adnexa without tenderness, enlargement, or mass   LAB RESULTS Results for orders placed or performed during the hospital encounter of 02/08/16 (from the past 24 hour(s))  Wet prep, genital     Status: Abnormal   Collection Time: 02/08/16  9:31 AM  Result Value Ref Range   Yeast Wet Prep HPF POC NONE SEEN NONE SEEN   Trich, Wet Prep PRESENT (A) NONE SEEN   Clue Cells Wet Prep HPF POC PRESENT (A) NONE SEEN   WBC, Wet Prep HPF POC FEW (A) NONE SEEN   Sperm NONE SEEN   Pregnancy, urine POC     Status: None   Collection Time: 02/08/16 10:25 AM  Result Value Ref Range   Preg Test, Ur NEGATIVE NEGATIVE       IMAGING No results found.  MAU Management/MDM: Pt received phone call and needed to leave MAU prior to wet prep and CBC results.  Pt was aware that pregnancy test was negative.  Plan to treat for gastroenteritis with Phenergan and treat pain with ibuprofen.  Rx for Bactrim DS also BID x 7 days to treat current draining abscess and pt to follow up with general surgery.  Pt to continue Depo at [facility].   Pt stable at time of discharge.  ASSESSMENT 1. Nausea without vomiting   2. Other dietetic gastroenteritis   3. Pain, female pelvic   4. History of rectal abscess     PLAN Discharge home Allergies as of 02/08/2016      Reactions   Other Hives   All nuts      Medication List    STOP taking these medications   clindamycin 300  MG capsule Commonly known as:  CLEOCIN   HYDROcodone-acetaminophen 5-325 MG tablet Commonly known as:  NORCO/VICODIN   predniSONE 10 MG (21) Tbpk tablet Commonly known as:  STERAPRED UNI-PAK 21 TAB     TAKE these medications   ibuprofen 600 MG tablet Commonly known as:  ADVIL,MOTRIN Take 1 tablet (600 mg total) by mouth every 6 (six) hours as needed. What changed:  medication strength  how much to take  when to take this  Another medication with the same name was removed. Continue taking this medication, and follow the directions you see here.   medroxyPROGESTERone 150 MG/ML injection Commonly known as:  DEPO-PROVERA Inject 150 mg into  the muscle every 3 (three) months. Pt is having next dose on 02/16/16   promethazine 25 MG tablet Commonly known as:  PHENERGAN Take 0.5-1 tablets (12.5-25 mg total) by mouth every 6 (six) hours as needed.   sulfamethoxazole-trimethoprim 800-160 MG tablet Commonly known as:  BACTRIM DS,SEPTRA DS Take 1 tablet by mouth 2 (two) times daily.      Follow-up Information    [facility] Follow up.   Specialty:  General Surgery Why:  Please call to follow up on previous procedure and need for further treatment. Contact information: [address] [place] [address] [phone]        Your Gyn provider as needed Follow up.        [facility] Follow up.   Why:  As needed for emergencies Contact information: [place] [phone] [place] Certified Nurse-Midwife 02/08/2016  10:44 AM   Addendum:  Pt left without wet prep and CBC results.  Wet prep positive for trichomonas.  Rx for Flagyl 2 g x 1 dose. Called pt and spoke with her about normal CBC results and positive test for trichomonas.  Pt was treated recently but believes her partner may not have been treated.  Pt may take Phenergan before Flagyl to prevent nausea.   Partner to go to [facility]. Pt to return to MAU with worsening symptoms. Pt states understanding and agrees with plan of care.   Fatima Blank, CNM 11:07 AM

## 2016-02-08 NOTE — Discharge Instructions (Signed)

## 2016-02-09 LAB — GC/CHLAMYDIA PROBE AMP (~~LOC~~) NOT AT ARMC
CHLAMYDIA, DNA PROBE: NEGATIVE
Neisseria Gonorrhea: NEGATIVE

## 2016-02-22 ENCOUNTER — Encounter (HOSPITAL_COMMUNITY): Payer: Self-pay

## 2016-02-22 ENCOUNTER — Emergency Department (HOSPITAL_COMMUNITY)
Admission: EM | Admit: 2016-02-22 | Discharge: 2016-02-22 | Disposition: A | Discharge status: Home / Self Care | Payer: Medicaid Other | Attending: Emergency Medicine | Admitting: Emergency Medicine

## 2016-02-22 ENCOUNTER — Emergency Department (HOSPITAL_COMMUNITY): Payer: Medicaid Other

## 2016-02-22 DIAGNOSIS — L03211 Cellulitis of face: Secondary | ICD-10-CM | POA: Insufficient documentation

## 2016-02-22 DIAGNOSIS — Z79899 Other long term (current) drug therapy: Secondary | ICD-10-CM | POA: Diagnosis not present

## 2016-02-22 DIAGNOSIS — F17210 Nicotine dependence, cigarettes, uncomplicated: Secondary | ICD-10-CM | POA: Insufficient documentation

## 2016-02-22 DIAGNOSIS — R220 Localized swelling, mass and lump, head: Secondary | ICD-10-CM | POA: Diagnosis present

## 2016-02-22 LAB — BASIC METABOLIC PANEL
ANION GAP: 8 (ref 5–15)
BUN: 7 mg/dL (ref 6–20)
CHLORIDE: 106 mmol/L (ref 101–111)
CO2: 20 mmol/L — AB (ref 22–32)
Calcium: 8.6 mg/dL — ABNORMAL LOW (ref 8.9–10.3)
Creatinine, Ser: 0.82 mg/dL (ref 0.44–1.00)
GFR calc Af Amer: >60 mL/min (ref >60)
Glucose, Bld: 94 mg/dL (ref 65–99)
POTASSIUM: 4.4 mmol/L (ref 3.5–5.1)
Sodium: 134 mmol/L — ABNORMAL LOW (ref 135–145)

## 2016-02-22 LAB — CBC WITH DIFFERENTIAL/PLATELET
BASOS ABS: 0 10*3/uL (ref 0.0–0.1)
Basophils Relative: 0 %
Eosinophils Absolute: 0 10*3/uL (ref 0.0–0.7)
Eosinophils Relative: 1 %
HEMATOCRIT: 39.1 % (ref 36.0–46.0)
Hemoglobin: 13.4 g/dL (ref 12.0–15.0)
LYMPHS ABS: 2.1 10*3/uL (ref 0.7–4.0)
LYMPHS PCT: 25 %
MCH: 28.9 pg (ref 26.0–34.0)
MCHC: 34.3 g/dL (ref 30.0–36.0)
MCV: 84.3 fL (ref 78.0–100.0)
Monocytes Absolute: 0.8 10*3/uL (ref 0.1–1.0)
Monocytes Relative: 9 %
NEUTROS ABS: 5.5 10*3/uL (ref 1.7–7.7)
Neutrophils Relative %: 65 %
PLATELETS: 272 10*3/uL (ref 150–400)
RBC: 4.64 MIL/uL (ref 3.87–5.11)
RDW: 15 % (ref 11.5–15.5)
WBC: 8.4 10*3/uL (ref 4.0–10.5)

## 2016-02-22 MED ORDER — KETOROLAC TROMETHAMINE 15 MG/ML IJ SOLN
15.0000 mg | Freq: Once | INTRAMUSCULAR | Status: AC
  Administered 2016-02-22: 15 mg via INTRAVENOUS
  Filled 2016-02-22: qty 1

## 2016-02-22 MED ORDER — IOPAMIDOL (ISOVUE-300) INJECTION 61%
INTRAVENOUS | Status: AC
  Administered 2016-02-22: 75 mL
  Filled 2016-02-22: qty 75

## 2016-02-22 MED ORDER — VANCOMYCIN HCL IN DEXTROSE 1-5 GM/200ML-% IV SOLN
1000.0000 mg | Freq: Two times a day (BID) | INTRAVENOUS | Status: DC
  Administered 2016-02-22: 1000 mg via INTRAVENOUS
  Filled 2016-02-22: qty 200

## 2016-02-22 MED ORDER — CEPHALEXIN 125 MG/5ML PO SUSR: 1000.0000 mg | Freq: Two times a day (BID) | ORAL | 0 refills | Status: DC

## 2016-02-22 MED ORDER — CLINDAMYCIN HCL 150 MG PO CAPS: 450.0000 mg | ORAL_CAPSULE | Freq: Three times a day (TID) | ORAL | 0 refills | Status: AC

## 2016-02-22 NOTE — ED Triage Notes (Addendum)
Pt reports having facial pain on the right side, now she has redness and some swelling noted to the cheek into the jaw area. Pt denies any new detergents or soaps. Pt also reports generalized body aches.

## 2016-02-22 NOTE — ED Provider Notes (Signed)
[facility] DEPT Provider Note   CSN: [id number] Arrival date & time: 02/22/16  [id number]     History   Chief Complaint Chief Complaint  Patient presents with  . Rash  . Facial Pain    HPI Valerie Walter is a 36 y.o. female with 3 days of right-sided facial swelling which began with a bump behind her ear and spread to her cheek and neck. She was labile and explaining that it had gotten worse since she arrived to the emergency department. She has a throbbing pain and is concerned that it is getting close to her eye. She has tried ibuprofen which helped with the fever but not with swelling and pain. It has progressed overnight, gotten worse and she decided to come in. She reports fevers of 101-102 at home with body aches and a stomach type cold. She denies nausea, vomiting, diarrhea, abdominal pain, chest pain, shortness of breath, visual disturbances, dental pain or sore throat. She has been eating and drinking well.  HPI  Past Medical History:  Diagnosis Date  . Miscarriage   . Ovarian cyst   . Seasonal allergies   . Thyroid disease   . Trichomonas   . Umbilical hernia     There are no active problems to display for this patient.   Past Surgical History:  Procedure Laterality Date  . APPENDECTOMY    . DILATION AND CURETTAGE OF UTERUS    . IRRIGATION AND DEBRIDEMENT ABSCESS      OB History    Gravida Para Term Preterm AB Living   5 2 2  0 3 2   SAB TAB Ectopic Multiple Live Births   2 1 0 0 2       Home Medications    Prior to Admission medications   Medication Sig Start Date End Date Taking? Authorizing Provider  albuterol (PROVENTIL HFA;VENTOLIN HFA) 108 (90 Base) MCG/ACT inhaler Inhale 1 puff into the lungs every 6 (six) hours as needed for wheezing or shortness of breath.   Yes Historical Provider, MD  ibuprofen (ADVIL,MOTRIN) 600 MG tablet Take 1 tablet (600 mg total) by mouth every 6 (six) hours as needed. 02/08/16  Yes Lisa A Leftwich-Kirby, CNM    medroxyPROGESTERone (DEPO-PROVERA) 150 MG/ML injection Inject 150 mg into the muscle every 3 (three) months. Pt is having next dose on 02/16/16   Yes Historical Provider, MD  cephALEXin (KEFLEX) 125 MG/5ML suspension Take 40 mLs (1,000 mg total) by mouth 2 (two) times daily. 02/22/16 02/29/16  Emeline General, PA-C  clindamycin (CLEOCIN) 150 MG capsule Take 3 capsules (450 mg total) by mouth 3 (three) times daily. 02/22/16 03/03/16  Emeline General, PA-C  metroNIDAZOLE (FLAGYL) 500 MG tablet Take two tablets by mouth twice a day, for one day.  Or you can take all four tablets at once if you can tolerate it. Patient not taking: Reported on 02/22/2016 02/08/16   Kathie Dike Leftwich-Kirby, CNM  promethazine (PHENERGAN) 25 MG tablet Take 0.5-1 tablets (12.5-25 mg total) by mouth every 6 (six) hours as needed. Patient not taking: Reported on 02/22/2016 02/08/16   Kathie Dike Leftwich-Kirby, CNM  sulfamethoxazole-trimethoprim (BACTRIM DS,SEPTRA DS) 800-160 MG tablet Take 1 tablet by mouth 2 (two) times daily. Patient not taking: Reported on 02/22/2016 02/08/16   Elvera Maria, CNM    Family History Family History  Problem Relation Age of Onset  . Cancer Mother     Social History Social History  Substance Use Topics  . Smoking status: Current  Every Day Smoker    Packs/day: 3.00    Types: Cigarettes  . Smokeless tobacco: Never Used  . Alcohol use Yes     Comment: occasional     Allergies   Other   Review of Systems Review of Systems  Constitutional: Positive for chills and fever.  HENT: Positive for facial swelling, rhinorrhea, sinus pressure and sneezing. Negative for congestion, drooling, ear pain, sore throat and trouble swallowing.   Eyes: Negative for pain, redness, itching and visual disturbance.       She states that she feels the pressure getting closer to her right eye.  Respiratory: Negative for cough and shortness of breath.   Cardiovascular: Negative for chest pain and  palpitations.  Gastrointestinal: Negative for abdominal distention, abdominal pain, diarrhea, nausea and vomiting.  Genitourinary: Negative for dysuria, flank pain and hematuria.  Musculoskeletal: Negative for arthralgias, back pain and neck stiffness.       She reports right-sided neck discomfort from the swelling  Skin: Positive for color change and rash. Negative for pallor and wound.       Erythema and raised rash to the right side of her face.  Neurological: Negative for seizures, syncope, facial asymmetry, weakness, light-headedness and numbness.  All other systems reviewed and are negative.    Physical Exam Updated Vital Signs BP 100/69   Pulse 67   Temp 98.7 F (37.1 C) (Oral)   Resp 18   Ht 5\' 3"  (1.6 m)   Wt 77.6 kg   SpO2 100%   BMI 30.29 kg/m   Physical Exam  Constitutional: She appears well-developed and well-nourished.  She was labile and scared that it seemed as though it was worsening since she got to the emergency department  HENT:  Head: Normocephalic and atraumatic.  Left Ear: External ear normal.  Nose: Nose normal.  Mouth/Throat: Oropharynx is clear and moist. No oropharyngeal exudate.  Right tympanic membrane is normal without evidence of otitis media. She has erythema of her canal. There is some swelling around the auricle spreading to her cheek.  No evidence of oral abscess dental pain or lugswig angina. Sublingual area is soft and non-tender. She is tolerating her oral secretions.  Eyes: Conjunctivae and EOM are normal. Pupils are equal, round, and reactive to light. Right eye exhibits no discharge. Left eye exhibits no discharge. No scleral icterus.  Patient had discomfort when looking all the way right but had full range of motion  Neck: Normal range of motion. Neck supple. No tracheal deviation present.  Cardiovascular: Normal rate, regular rhythm, normal heart sounds and intact distal pulses.   No murmur heard. Pulmonary/Chest: Effort normal and  breath sounds normal. No respiratory distress. She has no wheezes. She has no rales. She exhibits no tenderness.  Abdominal: She exhibits no distension.  Musculoskeletal: She exhibits no edema.  Lymphadenopathy:    She has cervical adenopathy.  Neurological: She is alert. No cranial nerve deficit.  Skin: Skin is warm and dry. Rash noted. She is not diaphoretic. There is erythema. No pallor.  Raised sharp bordered erythematous area spreading from the right ear to the right cheek with 1 cm sparing the orbital area. No purulent discharge. There is a well-defined border in the area is warm to touch  Psychiatric: She has a normal mood and affect.  Nursing note and vitals reviewed.    ED Treatments / Results  Labs (all labs ordered are listed, but only abnormal results are displayed) Labs Reviewed  BASIC METABOLIC PANEL -  Abnormal; Notable for the following:       Result Value   Sodium 134 (*)    CO2 20 (*)    Calcium 8.6 (*)    All other components within normal limits  CBC WITH DIFFERENTIAL/PLATELET    EKG  EKG Interpretation None       Radiology Ct Maxillofacial W Contrast  Result Date: 02/22/2016 CLINICAL DATA:  Right facial swelling EXAM: CT MAXILLOFACIAL WITH CONTRAST TECHNIQUE: Multidetector CT imaging of the maxillofacial structures was performed. Multiplanar CT image reconstructions were also generated. A small metallic BB was placed on the right temple in order to reliably differentiate right from left. COMPARISON:  None. FINDINGS: Osseous: No fracture or mandibular dislocation. No destructive process. Orbits: Negative. No traumatic or inflammatory finding. Sinuses: Clear. Soft tissues: Mild soft tissue swelling is noted on the right. This consists of some subcutaneous edema as well as considerable skin thickening. No focal abscess is seen. A few small periauricular reactive lymph nodes are noted. Limited intracranial: Within normal limits. IMPRESSION: Changes consistent with  right facial cellulitis. No focal abscess is seen. Some reactive lymphadenopathy is identified. Electronically Signed   By: Inez Catalina M.D.   On: 02/22/2016 13:13    Procedures Procedures (including critical care time)  Medications Ordered in ED Medications  vancomycin (VANCOCIN) IVPB 1000 mg/200 mL premix (0 mg Intravenous Stopped 02/22/16 1325)  ketorolac (TORADOL) 15 MG/ML injection 15 mg (15 mg Intravenous Given 02/22/16 1225)  iopamidol (ISOVUE-300) 61 % injection (75 mLs  Contrast Given 02/22/16 1246)     Initial Impression / Assessment and Plan / ED Course  I have reviewed the triage vital signs and the nursing notes.  Pertinent labs & imaging results that were available during my care of the patient were reviewed by me and considered in my medical decision making (see chart for details).  Clinical Course   36 year old presents with worsening facial swelling and rash on the right side of her face. She is tolerating oral secretions but states that she would have difficulty swallowing pills. She was febrile on my exam but non-toxic appearing. No evidence of focal deficit or concerns for abscess or lugwig.  Ordered CT maxillary to rule out abscess or to ensure that it is preseptal without orbital involvement. Started vancomycin 1 g IV and analgesia. CT was Negative for abscess or orbital cellulitis.  SPOKE WITH PHARMACY WHO RECOMMENDED 1G CEPHALEXIN suspension bid AND CLINDAMYCIN CAPSULE since patient cannot swallow pills well.  Patient's condition improved while in the ED. On reassessment, she felt much better and stated that the swelling and tightness had gone down. She wanted something to drink and was ready to go home since she had to go pickup her daughter. She tolerated PO well.  Discharge home with antibiotics and ibuprofen with close follow up with PCP.  Discussed strict return precautions. Patient was advised to return to the emergency department if experiencing any  worsening of symptoms. She understood instructions and agreed with discharge plan.  Patient was discussed with Dr. Wilson Singer who agrees with assessment and plan.  Final Clinical Impressions(s) / ED Diagnoses   Final diagnoses:  Cellulitis of face    New Prescriptions New Prescriptions   CEPHALEXIN (KEFLEX) 125 MG/5ML SUSPENSION    Take 40 mLs (1,000 mg total) by mouth 2 (two) times daily.   CLINDAMYCIN (CLEOCIN) 150 MG CAPSULE    Take 3 capsules (450 mg total) by mouth 3 (three) times daily.     Emeline General,  PA-C 02/22/16 [place], MD 02/23/16 1435

## 2016-02-24 ENCOUNTER — Emergency Department (HOSPITAL_BASED_OUTPATIENT_CLINIC_OR_DEPARTMENT_OTHER)
Admission: EM | Admit: 2016-02-24 | Discharge: 2016-02-24 | Disposition: A | Discharge status: Home / Self Care | Payer: Medicaid Other | Attending: Emergency Medicine | Admitting: Emergency Medicine

## 2016-02-24 ENCOUNTER — Emergency Department (HOSPITAL_BASED_OUTPATIENT_CLINIC_OR_DEPARTMENT_OTHER): Payer: Medicaid Other

## 2016-02-24 ENCOUNTER — Encounter (HOSPITAL_BASED_OUTPATIENT_CLINIC_OR_DEPARTMENT_OTHER): Payer: Self-pay | Admitting: Emergency Medicine

## 2016-02-24 DIAGNOSIS — R21 Rash and other nonspecific skin eruption: Secondary | ICD-10-CM | POA: Diagnosis present

## 2016-02-24 DIAGNOSIS — B029 Zoster without complications: Secondary | ICD-10-CM | POA: Diagnosis not present

## 2016-02-24 DIAGNOSIS — L03211 Cellulitis of face: Secondary | ICD-10-CM | POA: Diagnosis not present

## 2016-02-24 DIAGNOSIS — R51 Headache: Secondary | ICD-10-CM | POA: Insufficient documentation

## 2016-02-24 DIAGNOSIS — F17210 Nicotine dependence, cigarettes, uncomplicated: Secondary | ICD-10-CM | POA: Insufficient documentation

## 2016-02-24 LAB — CBC WITH DIFFERENTIAL/PLATELET
BASOS PCT: 0 %
Basophils Absolute: 0 10*3/uL (ref 0.0–0.1)
EOS ABS: 0.1 10*3/uL (ref 0.0–0.7)
Eosinophils Relative: 1 %
HCT: 39.2 % (ref 36.0–46.0)
Hemoglobin: 13.2 g/dL (ref 12.0–15.0)
Lymphocytes Relative: 27 %
Lymphs Abs: 2.4 10*3/uL (ref 0.7–4.0)
MCH: 28.6 pg (ref 26.0–34.0)
MCHC: 33.7 g/dL (ref 30.0–36.0)
MCV: 84.8 fL (ref 78.0–100.0)
MONO ABS: 1 10*3/uL (ref 0.1–1.0)
MONOS PCT: 11 %
NEUTROS PCT: 61 %
Neutro Abs: 5.3 10*3/uL (ref 1.7–7.7)
Platelets: 278 10*3/uL (ref 150–400)
RBC: 4.62 MIL/uL (ref 3.87–5.11)
RDW: 15.4 % (ref 11.5–15.5)
WBC: 8.8 10*3/uL (ref 4.0–10.5)

## 2016-02-24 LAB — COMPREHENSIVE METABOLIC PANEL
ALBUMIN: 3.5 g/dL (ref 3.5–5.0)
ALT: 15 U/L (ref 14–54)
AST: 18 U/L (ref 15–41)
Alkaline Phosphatase: 44 U/L (ref 38–126)
Anion gap: 6 (ref 5–15)
BILIRUBIN TOTAL: 0.2 mg/dL — AB (ref 0.3–1.2)
BUN: 7 mg/dL (ref 6–20)
CO2: 23 mmol/L (ref 22–32)
CREATININE: 0.72 mg/dL (ref 0.44–1.00)
Calcium: 8.2 mg/dL — ABNORMAL LOW (ref 8.9–10.3)
Chloride: 109 mmol/L (ref 101–111)
GFR calc Af Amer: >60 mL/min (ref >60)
GFR calc non Af Amer: >60 mL/min (ref >60)
GLUCOSE: 97 mg/dL (ref 65–99)
Potassium: 3.7 mmol/L (ref 3.5–5.1)
Sodium: 138 mmol/L (ref 135–145)
TOTAL PROTEIN: 7.3 g/dL (ref 6.5–8.1)

## 2016-02-24 LAB — I-STAT CG4 LACTIC ACID, ED
LACTIC ACID, VENOUS: 1.98 mmol/L — AB (ref 0.5–1.9)
LACTIC ACID, VENOUS: 1.03 mmol/L (ref 0.5–1.9)

## 2016-02-24 MED ORDER — ACYCLOVIR SODIUM 50 MG/ML IV SOLN
INTRAVENOUS | Status: AC
  Filled 2016-02-24: qty 10

## 2016-02-24 MED ORDER — IOPAMIDOL (ISOVUE-300) INJECTION 61%
100.0000 mL | Freq: Once | INTRAVENOUS | Status: AC | PRN
  Administered 2016-02-24: 100 mL via INTRAVENOUS

## 2016-02-24 MED ORDER — MORPHINE SULFATE (PF) 4 MG/ML IV SOLN
4.0000 mg | Freq: Once | INTRAVENOUS | Status: AC
  Administered 2016-02-24: 4 mg via INTRAVENOUS
  Filled 2016-02-24: qty 1

## 2016-02-24 MED ORDER — ACYCLOVIR 800 MG PO TABS: 800.0000 mg | ORAL_TABLET | Freq: Every day | ORAL | 0 refills | Status: DC

## 2016-02-24 MED ORDER — DEXTROSE 5 % IV SOLN
10.0000 mg/kg | Freq: Once | INTRAVENOUS | Status: AC
  Administered 2016-02-24: 775 mg via INTRAVENOUS
  Filled 2016-02-24: qty 15.5

## 2016-02-24 MED ORDER — SODIUM CHLORIDE 0.9 % IV BOLUS (SEPSIS)
1000.0000 mL | Freq: Once | INTRAVENOUS | Status: AC
  Administered 2016-02-24: 1000 mL via INTRAVENOUS

## 2016-02-24 MED ORDER — FLUORESCEIN SODIUM 0.6 MG OP STRP
1.0000 | ORAL_STRIP | Freq: Once | OPHTHALMIC | Status: AC
  Administered 2016-02-24: 1 via OPHTHALMIC
  Filled 2016-02-24: qty 1

## 2016-02-24 MED ORDER — METHYLPREDNISOLONE SODIUM SUCC 125 MG IJ SOLR: 125.0000 mg | Freq: Once | INTRAMUSCULAR | Status: DC

## 2016-02-24 MED ORDER — PREDNISONE 20 MG PO TABS: ORAL_TABLET | ORAL | 0 refills | Status: DC

## 2016-02-24 MED ORDER — METHYLPREDNISOLONE SODIUM SUCC 125 MG IJ SOLR
125.0000 mg | Freq: Once | INTRAMUSCULAR | Status: AC
  Administered 2016-02-24: 125 mg via INTRAVENOUS
  Filled 2016-02-24: qty 2

## 2016-02-24 MED ORDER — VALACYCLOVIR HCL 500 MG PO TABS: 1000.0000 mg | ORAL_TABLET | Freq: Once | ORAL | Status: DC

## 2016-02-24 NOTE — ED Triage Notes (Signed)
Pt reports rash and blistering to right side of face and neck x2 days with dizziness and blurred vision.  Pt also having L dental pain.  Pt alert and oriented at this time.

## 2016-02-24 NOTE — ED Notes (Signed)
Dr. Darl Householder notified of bp 96/65, told me she normally runs low.  Okay to discharge per Dr. Darl Householder.

## 2016-02-24 NOTE — ED Provider Notes (Signed)
[facility] Provider Note   CSN: [id number] Arrival date & time: 02/24/16  0805     History   Chief Complaint Chief Complaint  Patient presents with  . Rash    HPI DEANDA BARSON is a 36 y.o. female with no significant PMH who presents with worsening right facial swelling and pain.  HPI Patient was seen in the ED on 02/22/16 for right sided facial swelling. Reports this initially started with a bump on the posterior aspect of her right ear with some swelling. Then two days later she noticed the swelling spread to her right face and cheek. When she was seen in the ED, she was diagnosed with facial cellulitis and was prescribed Keflex and Clindmycin. She reports that small vesicles started to appear in the area which worsening overnight and brought her here. She reports her last fever was two days ago to 100.77F and she took ibuprofen for this. But she reports of having chills and feeling hot yesterday. She also started having a frontal headache with intermittent blurred vision yesterday. The facial swelling has become more painful with a burning sensation. Reports of some swelling on the lower lid of her right eye. Also reports of some pain with movement of her right eye. Denies issues with pain with swallowing or difficulty swallowing. She noticed she had some issues with her balance yesterday. Denies weakness in her upper and lower extremities or paresthesias in her extremities. She mentions of tooth pain on the left upper posterior tooth which started yesterday.   Reports that in October, she had the same rash on her left face. She was seen in the ED on 11/2015 and was diagnosed with shingles with possible superimposed mild cellulitis.   She has had keflex and clindamycin before without any side effects.   Past Medical History:  Diagnosis Date  . Miscarriage   . Ovarian cyst   . Seasonal allergies   . Thyroid disease   . Trichomonas   . Umbilical hernia     There  are no active problems to display for this patient.   Past Surgical History:  Procedure Laterality Date  . APPENDECTOMY    . DILATION AND CURETTAGE OF UTERUS    . IRRIGATION AND DEBRIDEMENT ABSCESS      OB History    Gravida Para Term Preterm AB Living   5 2 2  0 3 2   SAB TAB Ectopic Multiple Live Births   2 1 0 0 2       Home Medications    Prior to Admission medications   Medication Sig Start Date End Date Taking? Authorizing Provider  acyclovir (ZOVIRAX) 800 MG tablet Take 1 tablet (800 mg total) by mouth 5 (five) times daily. 02/25/16   Smiley Houseman, MD  albuterol (PROVENTIL HFA;VENTOLIN HFA) 108 (90 Base) MCG/ACT inhaler Inhale 1 puff into the lungs every 6 (six) hours as needed for wheezing or shortness of breath.    Historical Provider, MD  clindamycin (CLEOCIN) 150 MG capsule Take 3 capsules (450 mg total) by mouth 3 (three) times daily. 02/22/16 03/03/16  Emeline General, PA-C  ibuprofen (ADVIL,MOTRIN) 600 MG tablet Take 1 tablet (600 mg total) by mouth every 6 (six) hours as needed. 02/08/16   Lisa A Leftwich-Kirby, CNM  medroxyPROGESTERone (DEPO-PROVERA) 150 MG/ML injection Inject 150 mg into the muscle every 3 (three) months. Pt is having next dose on 02/16/16    Historical Provider, MD  metroNIDAZOLE (FLAGYL) 500 MG tablet Take  two tablets by mouth twice a day, for one day.  Or you can take all four tablets at once if you can tolerate it. Patient not taking: Reported on 02/22/2016 02/08/16   Lattie Haw A Leftwich-Kirby, CNM  predniSONE (DELTASONE) 20 MG tablet Take 60mg  x 2 days, 40mg  x 2 days, 20mg  x 3 days 02/24/16   Smiley Houseman, MD  promethazine (PHENERGAN) 25 MG tablet Take 0.5-1 tablets (12.5-25 mg total) by mouth every 6 (six) hours as needed. Patient not taking: Reported on 02/22/2016 02/08/16   Kathie Dike Leftwich-Kirby, CNM  sulfamethoxazole-trimethoprim (BACTRIM DS,SEPTRA DS) 800-160 MG tablet Take 1 tablet by mouth 2 (two) times daily. Patient not taking:  Reported on 02/22/2016 02/08/16   Elvera Maria, CNM    Family History Family History  Problem Relation Age of Onset  . Cancer Mother     Social History Social History  Substance Use Topics  . Smoking status: Current Every Day Smoker    Packs/day: 3.00    Types: Cigarettes  . Smokeless tobacco: Never Used  . Alcohol use Yes     Comment: occasional     Allergies   Other   Review of Systems Review of Systems  Constitutional: Positive for appetite change, chills and fever.  HENT: Negative for drooling and sore throat.   Eyes: Positive for pain.  Respiratory: Negative for shortness of breath.   Skin: Positive for rash.  Neurological: Positive for headaches. Negative for numbness.       Feeling intermittently off balance     Physical Exam Updated Vital Signs BP 105/71   Pulse (!) 56   Temp 98.1 F (36.7 C) (Oral)   Resp 18   Ht 5\' 3"  (1.6 m)   Wt 77.6 kg   SpO2 99%   BMI 30.29 kg/m   Physical Exam  Constitutional: She is oriented to person, place, and time. She appears well-developed and well-nourished. No distress.  HENT:  Soft tissue swelling of the right cheek and jaw area. Area of erythema is raised and distinct border on her cheek that extends to her right temple. There are vesicles that are white and some have coalesced. The nasolabial fold is not present on the right side. Has asymmetric smile on the right.   Eyes: Conjunctivae are normal. Pupils are equal, round, and reactive to light. Right eye exhibits no discharge. Left eye exhibits no discharge. No scleral icterus.  Fluorescein test is negative on right eye.   Neck: Normal range of motion.  Cardiovascular: Normal rate and regular rhythm.  Exam reveals no gallop and no friction rub.   No murmur heard. Pulmonary/Chest: Effort normal and breath sounds normal. No respiratory distress.  Abdominal: Soft. Bowel sounds are normal. She exhibits no distension.  Neurological: She is alert and oriented  to person, place, and time.  Normal strength   Skin: Skin is warm and dry. Capillary refill takes less than 2 seconds. She is not diaphoretic.  Psychiatric: She has a normal mood and affect. Her behavior is normal.   ED Treatments / Results  Labs (all labs ordered are listed, but only abnormal results are displayed) Labs Reviewed  COMPREHENSIVE METABOLIC PANEL - Abnormal; Notable for the following:       Result Value   Calcium 8.2 (*)    Total Bilirubin 0.2 (*)    All other components within normal limits  I-STAT CG4 LACTIC ACID, ED - Abnormal; Notable for the following:    Lactic Acid, Venous 1.98 (*)  All other components within normal limits  CULTURE, BLOOD (ROUTINE X 2)  CULTURE, BLOOD (ROUTINE X 2)  CBC WITH DIFFERENTIAL/PLATELET  I-STAT CG4 LACTIC ACID, ED    EKG  EKG Interpretation None       Radiology Ct Head W Or Wo Contrast  Result Date: 02/24/2016 CLINICAL DATA:  Rash with blistering of the right face and neck for the past 2 days. Dizziness. Blurred vision. Left dental pain. EXAM: CT HEAD WITHOUT AND WITH CONTRAST CT MAXILLOFACIAL WITH CONTRAST TECHNIQUE: Multidetector CT imaging of the head was performed using the standard protocol without IV contrast. CT imaging of the maxillofacial structures was performed with intravenous contrast. Multiplanar CT image reconstructions were also generated. CONTRAST:  198mL ISOVUE-300 IOPAMIDOL (ISOVUE-300) INJECTION 61%<Contrast>152mL ISOVUE-300 IOPAMIDOL (ISOVUE-300) INJECTION 61% COMPARISON:  Maxillofacial CT with contrast 02/22/2016 and head CT dated 02/23/2008. FINDINGS: CT HEAD FINDINGS Brain: Normal appearing cerebral hemispheres and posterior fossa structures. Normal size and position of the ventricles. No intracranial hemorrhage, mass lesion, infarction or abnormal enhancement seen. Vascular: Normal appearing vessels. Normal opacification of both cavernous sinuses. Skull: Normal. Negative for fracture or focal lesion. Other:  None. CT MAXILLOFACIAL Osseous: Unremarkable bones. Large cavity in the most posterior upper left molar. Orbits: Negative. No traumatic or inflammatory finding. Sinuses: Clear. Soft tissues: Mild right facial subcutaneous edema without significant change. No soft tissue air or fluid collections. IMPRESSION: 1. No significant change in right facial cellulitis without abscess. 2. No intracranial abnormality. 3. Large cavity in the most posterior upper left molar. Electronically Signed   By: Claudie Revering M.D.   On: 02/24/2016 12:36   Ct Maxillofacial W Contrast  Result Date: 02/24/2016 CLINICAL DATA:  Rash with blistering of the right face and neck for the past 2 days. Dizziness. Blurred vision. Left dental pain. EXAM: CT HEAD WITHOUT AND WITH CONTRAST CT MAXILLOFACIAL WITH CONTRAST TECHNIQUE: Multidetector CT imaging of the head was performed using the standard protocol without IV contrast. CT imaging of the maxillofacial structures was performed with intravenous contrast. Multiplanar CT image reconstructions were also generated. CONTRAST:  115mL ISOVUE-300 IOPAMIDOL (ISOVUE-300) INJECTION 61%<Contrast>143mL ISOVUE-300 IOPAMIDOL (ISOVUE-300) INJECTION 61% COMPARISON:  Maxillofacial CT with contrast 02/22/2016 and head CT dated 02/23/2008. FINDINGS: CT HEAD FINDINGS Brain: Normal appearing cerebral hemispheres and posterior fossa structures. Normal size and position of the ventricles. No intracranial hemorrhage, mass lesion, infarction or abnormal enhancement seen. Vascular: Normal appearing vessels. Normal opacification of both cavernous sinuses. Skull: Normal. Negative for fracture or focal lesion. Other: None. CT MAXILLOFACIAL Osseous: Unremarkable bones. Large cavity in the most posterior upper left molar. Orbits: Negative. No traumatic or inflammatory finding. Sinuses: Clear. Soft tissues: Mild right facial subcutaneous edema without significant change. No soft tissue air or fluid collections. IMPRESSION: 1.  No significant change in right facial cellulitis without abscess. 2. No intracranial abnormality. 3. Large cavity in the most posterior upper left molar. Electronically Signed   By: Claudie Revering M.D.   On: 02/24/2016 12:36    Procedures Procedures (including critical care time) Fluorescein test is negative on right eye.   Medications Ordered in ED Medications  acyclovir (ZOVIRAX) 775 mg in dextrose 5 % 150 mL IVPB (not administered)  fluorescein ophthalmic strip 1 strip (1 strip Right Eye Given by Other 02/24/16 0855)  sodium chloride 0.9 % bolus 1,000 mL (0 mLs Intravenous Stopped 02/24/16 1028)  morphine 4 MG/ML injection 4 mg (4 mg Intravenous Given 02/24/16 0855)  methylPREDNISolone sodium succinate (SOLU-MEDROL) 125 mg/2 mL injection 125 mg (125  mg Intravenous Given 02/24/16 0954)  morphine 4 MG/ML injection 4 mg (4 mg Intravenous Given 02/24/16 1101)  iopamidol (ISOVUE-300) 61 % injection 100 mL (100 mLs Intravenous Contrast Given 02/24/16 1154)     Initial Impression / Assessment and Plan / ED Course  I have reviewed the triage vital signs and the nursing notes.  Pertinent labs & imaging results that were available during my care of the patient were reviewed by me and considered in my medical decision making (see chart for details).  Clinical Course   Ms. Hodum is a 36 yo female with worsening right sided facial swelling and rash with pain with movement of her right eye and headache. Patient afebrile with stable vitals.  CBC without leukocytosis. CMP unremarkable.  Lactic acid mildly elevated 1.98.  at Blood cultures obtained. Morphine 4mg  for pain. 1L bolus. Differentials include cellulitis vs shingles. Unlikely medication reaction to antibiotics. Will give Methylprednisolone 125mg  x 1. Ordered CT maxillofacial and head due to worsening symptoms with HA.   Repeat lactic acid normal. CT maxillofacial and Head shows no significant change in R facial cellulitis without abscess and a large  cavity in the most posterior upper left molar.   Symptoms consistent with facial cellulitis with possible superimposed shingles. No sign of orbital cellulitis with imaging. Patient afebrile with stable vital signs. Reports of decreased swelling after IV steroid.  Will discharge home with instructions to continue Clindamycin given on 02/22/16 but discontinue Keflex, start steroid taper and Acyclovir for possible shingles. Patient reports she will not be able to afford medications at least Monday; due to this she was given 1 dose of IV acyclovir. Declined prescription for pain medication.  Return precautions discussed.    Final Clinical Impressions(s) / ED Diagnoses   Final diagnoses:  Facial cellulitis  Herpes zoster without complication    New Prescriptions New Prescriptions   ACYCLOVIR (ZOVIRAX) 800 MG TABLET    Take 1 tablet (800 mg total) by mouth 5 (five) times daily.   PREDNISONE (DELTASONE) 20 MG TABLET    Take 60mg  x 2 days, 40mg  x 2 days, 20mg  x 3 days     Smiley Houseman, MD 02/24/16 [place] Yao, MD 02/24/16 1902

## 2016-02-24 NOTE — ED Notes (Signed)
Pt made aware to return if symptoms worsen or if any life threatening symptoms occur.   

## 2016-02-24 NOTE — Discharge Instructions (Signed)
You have a bacterial skin infection on your face; you may also have shingles over the same area. Your imaging did not show any signs of abscess. Please continue to take Clindamycin (antibiotic). Please stop taking Keflex. For the possible shingles portion, please take the oral steroids as prescribed. You received IV acyclovir in the ED. Please take oral Acyclovir as prescribed for possible shingles

## 2016-02-29 LAB — CULTURE, BLOOD (ROUTINE X 2)
CULTURE: NO GROWTH
CULTURE: NO GROWTH

## 2016-03-02 ENCOUNTER — Inpatient Hospital Stay (HOSPITAL_BASED_OUTPATIENT_CLINIC_OR_DEPARTMENT_OTHER)
Admission: EM | Admit: 2016-03-02 | Discharge: 2016-03-03 | DRG: 872 | Discharge status: Against Medical Advice | Payer: Medicaid Other | Attending: Internal Medicine | Admitting: Internal Medicine

## 2016-03-02 ENCOUNTER — Encounter (HOSPITAL_BASED_OUTPATIENT_CLINIC_OR_DEPARTMENT_OTHER): Payer: Self-pay | Admitting: Emergency Medicine

## 2016-03-02 ENCOUNTER — Emergency Department (HOSPITAL_BASED_OUTPATIENT_CLINIC_OR_DEPARTMENT_OTHER): Payer: Medicaid Other

## 2016-03-02 DIAGNOSIS — A599 Trichomoniasis, unspecified: Secondary | ICD-10-CM | POA: Diagnosis present

## 2016-03-02 DIAGNOSIS — I959 Hypotension, unspecified: Secondary | ICD-10-CM | POA: Diagnosis present

## 2016-03-02 DIAGNOSIS — L03211 Cellulitis of face: Secondary | ICD-10-CM | POA: Diagnosis present

## 2016-03-02 DIAGNOSIS — F17210 Nicotine dependence, cigarettes, uncomplicated: Secondary | ICD-10-CM | POA: Diagnosis present

## 2016-03-02 DIAGNOSIS — E876 Hypokalemia: Secondary | ICD-10-CM | POA: Diagnosis present

## 2016-03-02 DIAGNOSIS — E872 Acidosis: Secondary | ICD-10-CM | POA: Diagnosis present

## 2016-03-02 DIAGNOSIS — A419 Sepsis, unspecified organism: Secondary | ICD-10-CM | POA: Diagnosis not present

## 2016-03-02 DIAGNOSIS — Z809 Family history of malignant neoplasm, unspecified: Secondary | ICD-10-CM | POA: Diagnosis not present

## 2016-03-02 DIAGNOSIS — R509 Fever, unspecified: Secondary | ICD-10-CM | POA: Diagnosis not present

## 2016-03-02 HISTORY — DX: Zoster without complications: B02.9

## 2016-03-02 LAB — LACTIC ACID, PLASMA
LACTIC ACID, VENOUS: 1.3 mmol/L (ref 0.5–1.9)
LACTIC ACID, VENOUS: 1.1 mmol/L (ref 0.5–1.9)
Lactic Acid, Venous: 1.2 mmol/L (ref 0.5–1.9)

## 2016-03-02 LAB — CBC WITH DIFFERENTIAL/PLATELET
BASOS ABS: 0 10*3/uL (ref 0.0–0.1)
BASOS PCT: 0 %
EOS ABS: 0 10*3/uL (ref 0.0–0.7)
EOS PCT: 0 %
HCT: 36.9 % (ref 36.0–46.0)
Hemoglobin: 12.5 g/dL (ref 12.0–15.0)
Lymphocytes Relative: 8 %
Lymphs Abs: 1.7 10*3/uL (ref 0.7–4.0)
MCH: 28.3 pg (ref 26.0–34.0)
MCHC: 33.9 g/dL (ref 30.0–36.0)
MCV: 83.7 fL (ref 78.0–100.0)
MONO ABS: 1.1 10*3/uL — AB (ref 0.1–1.0)
Monocytes Relative: 6 %
NEUTROS ABS: 17 10*3/uL — AB (ref 1.7–7.7)
Neutrophils Relative %: 86 %
Platelets: 379 10*3/uL (ref 150–400)
RBC: 4.41 MIL/uL (ref 3.87–5.11)
RDW: 14.9 % (ref 11.5–15.5)
WBC: 19.8 10*3/uL — ABNORMAL HIGH (ref 4.0–10.5)

## 2016-03-02 LAB — URINALYSIS, MICROSCOPIC (REFLEX)

## 2016-03-02 LAB — BASIC METABOLIC PANEL
ANION GAP: 10 (ref 5–15)
BUN: 8 mg/dL (ref 6–20)
CALCIUM: 8.3 mg/dL — AB (ref 8.9–10.3)
CO2: 22 mmol/L (ref 22–32)
Chloride: 102 mmol/L (ref 101–111)
Creatinine, Ser: 0.88 mg/dL (ref 0.44–1.00)
GFR calc Af Amer: >60 mL/min (ref >60)
GLUCOSE: 143 mg/dL — AB (ref 65–99)
Potassium: 3.1 mmol/L — ABNORMAL LOW (ref 3.5–5.1)
Sodium: 134 mmol/L — ABNORMAL LOW (ref 135–145)

## 2016-03-02 LAB — CBC
HCT: 31.8 % — ABNORMAL LOW (ref 36.0–46.0)
HEMOGLOBIN: 10.7 g/dL — AB (ref 12.0–15.0)
MCH: 28 pg (ref 26.0–34.0)
MCHC: 33.6 g/dL (ref 30.0–36.0)
MCV: 83.2 fL (ref 78.0–100.0)
PLATELETS: 337 10*3/uL (ref 150–400)
RBC: 3.82 MIL/uL — AB (ref 3.87–5.11)
RDW: 15.2 % (ref 11.5–15.5)
WBC: 13.8 10*3/uL — ABNORMAL HIGH (ref 4.0–10.5)

## 2016-03-02 LAB — CREATININE, SERUM
CREATININE: 0.69 mg/dL (ref 0.44–1.00)
GFR calc Af Amer: >60 mL/min (ref >60)

## 2016-03-02 LAB — I-STAT CG4 LACTIC ACID, ED: Lactic Acid, Venous: 3.33 mmol/L (ref 0.5–1.9)

## 2016-03-02 LAB — URINALYSIS, ROUTINE W REFLEX MICROSCOPIC
Bilirubin Urine: NEGATIVE
GLUCOSE, UA: NEGATIVE mg/dL
Ketones, ur: NEGATIVE mg/dL
Nitrite: NEGATIVE
PROTEIN: NEGATIVE mg/dL
Specific Gravity, Urine: 1.012 (ref 1.005–1.030)
pH: 6 (ref 5.0–8.0)

## 2016-03-02 MED ORDER — ACETAMINOPHEN 325 MG PO TABS: 650.0000 mg | ORAL_TABLET | Freq: Four times a day (QID) | ORAL | Status: DC | PRN

## 2016-03-02 MED ORDER — ONDANSETRON HCL 4 MG/2ML IJ SOLN
4.0000 mg | Freq: Four times a day (QID) | INTRAMUSCULAR | Status: DC | PRN
  Administered 2016-03-02: 4 mg via INTRAVENOUS
  Filled 2016-03-02: qty 2

## 2016-03-02 MED ORDER — PIPERACILLIN-TAZOBACTAM 3.375 G IVPB
3.3750 g | Freq: Three times a day (TID) | INTRAVENOUS | Status: DC
  Administered 2016-03-02 – 2016-03-03 (×3): 3.375 g via INTRAVENOUS
  Filled 2016-03-02 (×3): qty 50

## 2016-03-02 MED ORDER — ACETAMINOPHEN 500 MG PO TABS
1000.0000 mg | ORAL_TABLET | Freq: Once | ORAL | Status: AC
  Administered 2016-03-02: 1000 mg via ORAL
  Filled 2016-03-02: qty 2

## 2016-03-02 MED ORDER — CLINDAMYCIN PHOSPHATE 600 MG/50ML IV SOLN
600.0000 mg | Freq: Once | INTRAVENOUS | Status: DC
  Filled 2016-03-02: qty 50

## 2016-03-02 MED ORDER — SODIUM CHLORIDE 0.9 % IV SOLN
INTRAVENOUS | Status: DC
  Administered 2016-03-02 – 2016-03-03 (×2): via INTRAVENOUS

## 2016-03-02 MED ORDER — VANCOMYCIN HCL IN DEXTROSE 750-5 MG/150ML-% IV SOLN
750.0000 mg | Freq: Three times a day (TID) | INTRAVENOUS | Status: DC
  Administered 2016-03-02 – 2016-03-03 (×3): 750 mg via INTRAVENOUS
  Filled 2016-03-02 (×5): qty 150

## 2016-03-02 MED ORDER — PIPERACILLIN-TAZOBACTAM 3.375 G IVPB 30 MIN
3.3750 g | Freq: Once | INTRAVENOUS | Status: AC
  Administered 2016-03-02: 3.375 g via INTRAVENOUS
  Filled 2016-03-02 (×2): qty 50

## 2016-03-02 MED ORDER — ACETAMINOPHEN 650 MG RE SUPP: 650.0000 mg | Freq: Four times a day (QID) | RECTAL | Status: DC | PRN

## 2016-03-02 MED ORDER — MORPHINE SULFATE (PF) 2 MG/ML IV SOLN
1.0000 mg | INTRAVENOUS | Status: DC | PRN
Start: 2016-03-02 — End: 2016-03-03
  Administered 2016-03-02: 1 mg via INTRAVENOUS
  Filled 2016-03-02 (×2): qty 1

## 2016-03-02 MED ORDER — SODIUM CHLORIDE 0.9 % IV BOLUS (SEPSIS)
1000.0000 mL | Freq: Once | INTRAVENOUS | Status: AC
  Administered 2016-03-02: 1000 mL via INTRAVENOUS

## 2016-03-02 MED ORDER — IBUPROFEN 200 MG PO TABS
400.0000 mg | ORAL_TABLET | ORAL | Status: DC | PRN
Start: 2016-03-02 — End: 2016-03-03

## 2016-03-02 MED ORDER — VANCOMYCIN HCL IN DEXTROSE 1-5 GM/200ML-% IV SOLN
1000.0000 mg | Freq: Once | INTRAVENOUS | Status: AC
  Administered 2016-03-02: 1000 mg via INTRAVENOUS
  Filled 2016-03-02: qty 200

## 2016-03-02 MED ORDER — SODIUM CHLORIDE 0.9 % IV BOLUS (SEPSIS)
500.0000 mL | Freq: Once | INTRAVENOUS | Status: AC
  Administered 2016-03-02: 500 mL via INTRAVENOUS

## 2016-03-02 MED ORDER — METRONIDAZOLE 500 MG PO TABS
2000.0000 mg | ORAL_TABLET | Freq: Once | ORAL | Status: AC
  Administered 2016-03-02: 2000 mg via ORAL
  Filled 2016-03-02: qty 4

## 2016-03-02 MED ORDER — VANCOMYCIN HCL IN DEXTROSE 1-5 GM/200ML-% IV SOLN: 1000.0000 mg | Freq: Once | INTRAVENOUS | Status: DC

## 2016-03-02 MED ORDER — MORPHINE SULFATE (PF) 4 MG/ML IV SOLN
4.0000 mg | Freq: Once | INTRAVENOUS | Status: AC
Start: 2016-03-02 — End: 2016-03-02
  Administered 2016-03-02: 4 mg via INTRAVENOUS
  Filled 2016-03-02: qty 1

## 2016-03-02 MED ORDER — ONDANSETRON HCL 4 MG/2ML IJ SOLN
4.0000 mg | Freq: Once | INTRAMUSCULAR | Status: AC
  Administered 2016-03-02: 4 mg via INTRAVENOUS
  Filled 2016-03-02: qty 2

## 2016-03-02 MED ORDER — ENOXAPARIN SODIUM 40 MG/0.4ML ~~LOC~~ SOLN
40.0000 mg | SUBCUTANEOUS | Status: DC
  Administered 2016-03-02: 40 mg via SUBCUTANEOUS
  Filled 2016-03-02: qty 0.4

## 2016-03-02 MED ORDER — ONDANSETRON HCL 4 MG PO TABS: 4.0000 mg | ORAL_TABLET | Freq: Four times a day (QID) | ORAL | Status: DC | PRN

## 2016-03-02 MED ORDER — PROMETHAZINE HCL 25 MG/ML IJ SOLN
12.5000 mg | Freq: Once | INTRAMUSCULAR | Status: AC
  Administered 2016-03-02: 12.5 mg via INTRAVENOUS
  Filled 2016-03-02: qty 1

## 2016-03-02 MED ORDER — PIPERACILLIN-TAZOBACTAM 3.375 G IVPB 30 MIN: 3.3750 g | Freq: Once | INTRAVENOUS | Status: DC

## 2016-03-02 MED ORDER — DIPHENHYDRAMINE HCL 25 MG PO CAPS
25.0000 mg | ORAL_CAPSULE | Freq: Four times a day (QID) | ORAL | Status: DC | PRN
  Administered 2016-03-02: 25 mg via ORAL
  Filled 2016-03-02: qty 1

## 2016-03-02 NOTE — ED Notes (Signed)
Ocie Cornfield, PA at bedside.

## 2016-03-02 NOTE — ED Notes (Signed)
[facility]link given :EMTALA, carelink transfer form, med necessity form, e-signature.  Pt alert and oriented at discharge.

## 2016-03-02 NOTE — ED Provider Notes (Signed)
[place] DEPT [facility] Provider Note   CSN: [id number] Arrival date & time: 03/02/16  [id number]     History   Chief Complaint Chief Complaint  Patient presents with  . Headache    HPI Valerie Walter is a 36 y.o. female.  HPI 36 year old African-American female with no significant past medical history presents to the ED today with complaint of continued facial swelling, fever, "knot" on right side of neck. Patient was seen initially on 1/11 for a small pulp of the right side of her ear. CAT scan was performed at that time that showed facial cellulitis with no focal abscess was placed on Keflex including. Patient was taking antibiotics and stated that the fever was resolving but her swelling was worsening. She also complained of worsening headaches and right eye with blurry vision. Patient was seen back in ED 2 days later on 1/13 with a possible vesicular rash in dermatomal pattern. She was placed on IV acyclovir for possible shingles. The Keflex was discontinued at that time she was placed on Clinda due to possible allergy. She is also placed on a steroid taper. Patient states that the facial blisters have since cleared up. However approximately 2 days ago she started noticing a knot on the right side of her neck that was painful to move. She also notes a headache at the base of her occiput. She also notes swelling to her posterior head and forehead for the past 2 days. Patient denies any injury. States that she took her clindamycin for 4 days. Taking because it was giving her GI upset. Patient has been off of the antibiotics for 3 days now. Patient did not finished a steroid taper. Patient states that her fevers and right rash was improving. Reports fevers for the last 3 days. Take Aleve at home for the fevers. Patient denies any vision changes, chest pain, shortness of breath, lightheadedness, dizziness, abdominal pain, nausea, emesis, urinary symptoms, vaginal symptoms, change in bowel habits,  numbness/tingling. Past Medical History:  Diagnosis Date  . Miscarriage   . Ovarian cyst   . Seasonal allergies   . Shingles   . Thyroid disease   . Trichomonas   . Umbilical hernia     There are no active problems to display for this patient.   Past Surgical History:  Procedure Laterality Date  . APPENDECTOMY    . DILATION AND CURETTAGE OF UTERUS    . IRRIGATION AND DEBRIDEMENT ABSCESS      OB History    Gravida Para Term Preterm AB Living   5 2 2  0 3 2   SAB TAB Ectopic Multiple Live Births   2 1 0 0 2       Home Medications    Prior to Admission medications   Medication Sig Start Date End Date Taking? Authorizing Provider  acyclovir (ZOVIRAX) 800 MG tablet Take 1 tablet (800 mg total) by mouth 5 (five) times daily. 02/25/16   Smiley Houseman, MD  albuterol (PROVENTIL HFA;VENTOLIN HFA) 108 (90 Base) MCG/ACT inhaler Inhale 1 puff into the lungs every 6 (six) hours as needed for wheezing or shortness of breath.    Historical Provider, MD  clindamycin (CLEOCIN) 150 MG capsule Take 3 capsules (450 mg total) by mouth 3 (three) times daily. 02/22/16 03/03/16  Emeline General, PA-C  ibuprofen (ADVIL,MOTRIN) 600 MG tablet Take 1 tablet (600 mg total) by mouth every 6 (six) hours as needed. 02/08/16   Kathie Dike Leftwich-Kirby, CNM  medroxyPROGESTERone (DEPO-PROVERA) 150 MG/ML  injection Inject 150 mg into the muscle every 3 (three) months. Pt is having next dose on 02/16/16    Historical Provider, MD  metroNIDAZOLE (FLAGYL) 500 MG tablet Take two tablets by mouth twice a day, for one day.  Or you can take all four tablets at once if you can tolerate it. Patient not taking: Reported on 02/22/2016 02/08/16   Kathie Dike Leftwich-Kirby, CNM  predniSONE (DELTASONE) 20 MG tablet Take 60mg  x 2 days, 40mg  x 2 days, 20mg  x 3 days 02/24/16   Smiley Houseman, MD  promethazine (PHENERGAN) 25 MG tablet Take 0.5-1 tablets (12.5-25 mg total) by mouth every 6 (six) hours as needed. Patient not  taking: Reported on 02/22/2016 02/08/16   Kathie Dike Leftwich-Kirby, CNM  sulfamethoxazole-trimethoprim (BACTRIM DS,SEPTRA DS) 800-160 MG tablet Take 1 tablet by mouth 2 (two) times daily. Patient not taking: Reported on 02/22/2016 02/08/16   Elvera Maria, CNM    Family History Family History  Problem Relation Age of Onset  . Cancer Mother     Social History Social History  Substance Use Topics  . Smoking status: Current Every Day Smoker    Packs/day: 3.00    Types: Cigarettes  . Smokeless tobacco: Never Used  . Alcohol use Yes     Comment: occasional     Allergies   Other   Review of Systems Review of Systems  Constitutional: Positive for chills and fever.  HENT: Negative for congestion.   Eyes: Negative for visual disturbance.  Respiratory: Negative for cough, shortness of breath and wheezing.   Cardiovascular: Negative for chest pain.  Gastrointestinal: Negative for abdominal pain, diarrhea, nausea and vomiting.  Genitourinary: Negative for dysuria, frequency, urgency, vaginal bleeding and vaginal discharge.  Musculoskeletal: Negative for myalgias, neck pain and neck stiffness.  Skin: Positive for color change and rash.  Neurological: Positive for headaches. Negative for dizziness, syncope, weakness, light-headedness and numbness.  All other systems reviewed and are negative.    Physical Exam Updated Vital Signs BP 132/93 (BP Location: Left Arm)   Pulse 99   Temp 101.1 F (38.4 C) (Oral)   Resp 18   Ht 5\' 3"  (1.6 m)   Wt 77.6 kg   SpO2 99%   BMI 30.29 kg/m   Physical Exam  Constitutional: She is oriented to person, place, and time. She appears well-developed and well-nourished. No distress.  HENT:  Head: Normocephalic and atraumatic.  Right Ear: Tympanic membrane, external ear and ear canal normal.  Left Ear: Tympanic membrane, external ear and ear canal normal.  Nose: Nose normal.  Mouth/Throat: Uvula is midline, oropharynx is clear and moist and  mucous membranes are normal.  Soft tissue vesicular rash and swelling of the right cheek and jaw area has decreased and mostly resolved. Erythema and edema noted to the forehead. No focal abscess noted. No induration or fluctuance.   Eyes: Conjunctivae and EOM are normal. Pupils are equal, round, and reactive to light. Right eye exhibits no discharge. Left eye exhibits no discharge. No scleral icterus.  EOM without any pain. Conjunctivae are normal.  Neck: Normal range of motion. Neck supple. No thyromegaly present.  Tender right occipital lymph node that is tender.  Cardiovascular: Normal rate, regular rhythm, normal heart sounds and intact distal pulses.  Exam reveals no gallop and no friction rub.   No murmur heard. Pulmonary/Chest: Effort normal and breath sounds normal. No respiratory distress. She exhibits no tenderness.  Abdominal: Soft. Bowel sounds are normal. She exhibits no distension.  There is no tenderness. There is no rebound and no guarding.  Musculoskeletal: Normal range of motion.  Lymphadenopathy:    She has cervical adenopathy (right tender cervical chain).  Neurological: She is alert and oriented to person, place, and time.  The patient is alert, attentive, and oriented x 3. Speech is clear. Cranial nerve II-VII grossly intact. Negative pronator drift. Sensation intact. Strength 5/5 in all extremities. Reflexes 2+ and symmetric at biceps, triceps, knees, and ankles. Rapid alternating movement and fine finger movements intact. Romberg is absent. Posture and gait normal.   Skin: Skin is warm and dry. Capillary refill takes less than 2 seconds.  Nursing note and vitals reviewed.    ED Treatments / Results  Labs (all labs ordered are listed, but only abnormal results are displayed) Labs Reviewed  BASIC METABOLIC PANEL - Abnormal; Notable for the following:       Result Value   Sodium 134 (*)    Potassium 3.1 (*)    Glucose, Bld 143 (*)    Calcium 8.3 (*)    All other  components within normal limits  CBC WITH DIFFERENTIAL/PLATELET - Abnormal; Notable for the following:    WBC 19.8 (*)    Neutro Abs 17.0 (*)    Monocytes Absolute 1.1 (*)    All other components within normal limits  URINALYSIS, ROUTINE W REFLEX MICROSCOPIC - Abnormal; Notable for the following:    APPearance CLOUDY (*)    Hgb urine dipstick SMALL (*)    Leukocytes, UA SMALL (*)    All other components within normal limits  URINALYSIS, MICROSCOPIC (REFLEX) - Abnormal; Notable for the following:    Bacteria, UA RARE (*)    Squamous Epithelial / LPF 0-5 (*)    All other components within normal limits  I-STAT CG4 LACTIC ACID, ED - Abnormal; Notable for the following:    Lactic Acid, Venous 3.33 (*)    All other components within normal limits  CULTURE, BLOOD (ROUTINE X 2)  CULTURE, BLOOD (ROUTINE X 2)  URINE CULTURE  BASIC METABOLIC PANEL    EKG  EKG Interpretation  Date/Time:  Saturday March 02 2016 11:24:36 EST Ventricular Rate:  68 PR Interval:    QRS Duration: 98 QT Interval:  482 QTC Calculation: 513 R Axis:   12 Text Interpretation:  Sinus rhythm Low voltage, precordial leads RSR' in V1 or V2, right VCD or RVH Consider anterior infarct Prolonged QT interval Since previous tracing QT has lengthened Confirmed by Canary Brim  MD, MARTHA [phone]) on 03/02/2016 11:27:25 AM       Radiology Dg Chest 2 View  Result Date: 03/02/2016 CLINICAL DATA:  Fever and chills over the last 2 days duration. EXAM: CHEST  2 VIEW COMPARISON:  11/30/2014 FINDINGS: Heart size is normal. Mediastinal shadows are normal. The lungs are clear. No bronchial thickening. No infiltrate, mass, effusion or collapse. Pulmonary vascularity is normal. No bony abnormality. IMPRESSION: Normal chest Electronically Signed   By: Nelson Chimes M.D.   On: 03/02/2016 10:50   Procedures Procedures (including critical care time)  Medications Ordered in ED Medications  piperacillin-tazobactam (ZOSYN) IVPB 3.375 g (not  administered)  vancomycin (VANCOCIN) IVPB 750 mg/150 ml premix (not administered)  promethazine (PHENERGAN) injection 12.5 mg (not administered)  sodium chloride 0.9 % bolus 1,000 mL (0 mLs Intravenous Stopped 03/02/16 1209)  acetaminophen (TYLENOL) tablet 1,000 mg (1,000 mg Oral Given 03/02/16 0953)  morphine 4 MG/ML injection 4 mg (4 mg Intravenous Given 03/02/16 0955)  ondansetron (ZOFRAN) injection  4 mg (4 mg Intravenous Given 03/02/16 0955)  piperacillin-tazobactam (ZOSYN) IVPB 3.375 g (0 g Intravenous Stopped 03/02/16 1109)  vancomycin (VANCOCIN) IVPB 1000 mg/200 mL premix (0 mg Intravenous Stopped 03/02/16 1127)  sodium chloride 0.9 % bolus 1,000 mL (0 mLs Intravenous Stopped 03/02/16 1209)  sodium chloride 0.9 % bolus 500 mL (500 mLs Intravenous New Bag/Given 03/02/16 1212)  metroNIDAZOLE (FLAGYL) tablet 2,000 mg (2,000 mg Oral Given 03/02/16 1232)     Initial Impression / Assessment and Plan / ED Course  I have reviewed the triage vital signs and the nursing notes.  Pertinent labs & imaging results that were available during my care of the patient were reviewed by me and considered in my medical decision making (see chart for details).    Patient presents to the ED with edema and erythema to the forehead. Patient was also febrile at 101 with a heart rate of 99. Patient's lactate was noted to be 3.33. Sepsis protocol was initiated and she was, with thinking Zosyn for cellulitis. Blood cultures were obtained prior to initiation of antibiotics. Patient was given 30 mL per KG fluid bolus. Pressures were initially the systolic [id number]. However it is pressure has been systolics in the low 0000000. Patient has got her full fluid bolus. Heart rate and fever has improved with fluids and Tylenol. Cellulitis of the right cheek has improved since initial onset. Patient does have reactive lymphadenopathy of the cervical and occipital chains. Tender to palpation. Patient has no nuchal rigidity. Low suspicion for  meningitis given the patient's symptoms have been ongoing for the past week and a half. There is no eye involvement. EOMs are intact. Ear without signs of infection. Was positive for Trichomonas. She was treated for Trichomonas. Patient does have a prolonged QT. Have held Zofran this time. Patient began vomiting. Given Phenergan. Patient no focal neuro deficits. White count was noted to be 19. Patient does have a mild hypokalemia 3.1. Will need to be replaced. Chest x-ray was unremarkable. Do not feel that patient needs a head CT at this time. This is likely worsening cellulitis given the patient did not finish her antibiotics. Have low suspicion for meningitis. Patient was seen and evaluated with Dr. Canary Brim who agrees the above plan. Talked with Dr. Cathlean Sauer triad hospitalist who will admit patient to [facility] to the stepdown unit. Admission orders were placed. Patient is currently hemodynamically stable. She is in no acute distress. Patient was transferred by EMS to [facility] ED.   Final Clinical Impressions(s) / ED Diagnoses   Final diagnoses:  Sepsis, due to unspecified organism [facility])  Cellulitis of face  Trichomoniasis    New Prescriptions New Prescriptions   No medications on file     Doristine Devoid, PA-C 03/02/16 [place], MD 03/02/16 1259

## 2016-03-02 NOTE — ED Triage Notes (Addendum)
Pt reports posterior head swelling and forehead swelling for 2 days and a "knot" on the right side of neck starting last night with chills and fever.   Pt denies injury.  Pt was treated for shingles last week and is taking medicine for this.  Pt reports her facial blisters have cleared up.  Pt alert and oriented.

## 2016-03-02 NOTE — ED Notes (Signed)
Pt had emesis x1. Chrissie Noa, [place] notified.

## 2016-03-02 NOTE — ED Notes (Addendum)
Attempted 2nd iv by nicky,rt without success.  Pt does not want any more sticks at this time. Notified kenneth, PA that we are unable to obtain second lactic acid due to this.

## 2016-03-02 NOTE — ED Notes (Signed)
Attempted report x1. 

## 2016-03-02 NOTE — H&P (Signed)
History and Physical    Valerie Walter [id number] DOB: 01-Mar-1980 DOA: 03/02/2016  PCP: [facility]   Patient coming from: [facility] ED  Chief Complaint: Facial pain   HPI: Valerie Walter is a 36 y.o. female with medical history significant of recent right  facial zoster with cellulitis. Patient was unable to complete antibiotic therapy due to nausea and vomiting, only took about 4 days. Her facial rash and blisters improved at the level of the upper maxillary region, but for the last 2 days a new rash has appeared on her forehead. Rash has been worsening, affecting the forehead and extending into the scalp, it is very tender, associated with poor appetite, fever, chills and neck pain. No improving or worsening factors. Due to to worsening symptoms she returned to the emergency room.    The blisters in her face have crusted, did not reach the nose and she did not have any visual or eye complains.    ED Course: Noted rash on the forehead, fever, leukocytosis and hypotension. Prescribed IV fluids, broad spectrum antibiotics and referred for admission and further treatment.   Review of Systems:  1. General. No weight gain, no weight loss, positive for fevers and chills 2. ENT no runny nose or sore throat 3. Pulmonary no shortness of breath cough or hemoptysis 4. Cardiovascular no angina, claudication no PND orthopnea 5. Gastrointestinal, positive for nausea but no diarrhea 6. Hematology no easy bruisability or frequent infections 7. Endocrine a tremors heat or cold intolerance 8. Dermatology no rashes 9. Neurology no seizures or paresthesias 10. Urology no dysuria or increased urinary frequency  Past Medical History:  Diagnosis Date  . Miscarriage   . Ovarian cyst   . Seasonal allergies   . Shingles   . Thyroid disease   . Trichomonas   . Umbilical hernia     Past Surgical History:  Procedure Laterality Date  . APPENDECTOMY    . DILATION AND  CURETTAGE OF UTERUS    . IRRIGATION AND DEBRIDEMENT ABSCESS       reports that she has been smoking Cigarettes.  She has been smoking about 3.00 packs per day. She has never used smokeless tobacco. She reports that she drinks alcohol. She reports that she does not use drugs.  Allergies  Allergen Reactions  . Other Hives    All nuts    Family History  Problem Relation Age of Onset  . Cancer Mother    Unacceptable: Noncontributory, unremarkable, or negative. Acceptable: Family history reviewed and not pertinent (If you reviewed it)  Prior to Admission medications   Medication Sig Start Date End Date Taking? Authorizing Provider  acyclovir (ZOVIRAX) 800 MG tablet Take 1 tablet (800 mg total) by mouth 5 (five) times daily. 02/25/16   Smiley Houseman, MD  albuterol (PROVENTIL HFA;VENTOLIN HFA) 108 (90 Base) MCG/ACT inhaler Inhale 1 puff into the lungs every 6 (six) hours as needed for wheezing or shortness of breath.    Historical Provider, MD  clindamycin (CLEOCIN) 150 MG capsule Take 3 capsules (450 mg total) by mouth 3 (three) times daily. 02/22/16 03/03/16  Emeline General, PA-C  ibuprofen (ADVIL,MOTRIN) 600 MG tablet Take 1 tablet (600 mg total) by mouth every 6 (six) hours as needed. 02/08/16   Lisa A Leftwich-Kirby, CNM  medroxyPROGESTERone (DEPO-PROVERA) 150 MG/ML injection Inject 150 mg into the muscle every 3 (three) months. Pt is having next dose on 02/16/16    Historical Provider, MD  metroNIDAZOLE (  FLAGYL) 500 MG tablet Take two tablets by mouth twice a day, for one day.  Or you can take all four tablets at once if you can tolerate it. Patient not taking: Reported on 02/22/2016 02/08/16   Lattie Haw A Leftwich-Kirby, CNM  predniSONE (DELTASONE) 20 MG tablet Take 60mg  x 2 days, 40mg  x 2 days, 20mg  x 3 days 02/24/16   Smiley Houseman, MD  promethazine (PHENERGAN) 25 MG tablet Take 0.5-1 tablets (12.5-25 mg total) by mouth every 6 (six) hours as needed. Patient not taking: Reported  on 02/22/2016 02/08/16   Kathie Dike Leftwich-Kirby, CNM  sulfamethoxazole-trimethoprim (BACTRIM DS,SEPTRA DS) 800-160 MG tablet Take 1 tablet by mouth 2 (two) times daily. Patient not taking: Reported on 02/22/2016 02/08/16   Elvera Maria, CNM    Physical Exam: Vitals:   03/02/16 1230 03/02/16 1300 03/02/16 1321 03/02/16 1325  BP:  93/68 93/68 93/68   Pulse: (!) 54  65 65  Resp: 15 15 18 14   Temp:   98.6 F (37 C) 98.6 F (37 C)  TempSrc:    Oral  SpO2: 99%  99% 99%  Weight:      Height:          Constitutional: deconditioned and in pain.  Vitals:   03/02/16 1230 03/02/16 1300 03/02/16 1321 03/02/16 1325  BP:  93/68 93/68 93/68   Pulse: (!) 54  65 65  Resp: 15 15 18 14   Temp:   98.6 F (37 C) 98.6 F (37 C)  TempSrc:    Oral  SpO2: 99%  99% 99%  Weight:      Height:       Eyes: PERRL, lids and conjunctivae mild pale with no icterus.  ENMT: Mucous membranes are dry. Posterior pharynx clear of any exudate or lesions.Normal dentition.  Neck: normal, supple, no masses, no thyromegaly. Positive posterior sternoclavicular and occipital lymph nodes, tender to palpation. No neck rigidity.   Respiratory: clear to auscultation bilaterally, no wheezing, no crackles. Normal respiratory effort. No accessory muscle use.  Cardiovascular: Regular rate and rhythm,tachyacrdic no murmurs / rubs / gallops. No extremity edema. 2+ pedal pulses. No carotid bruits.  Abdomen: no tenderness, no masses palpated. No hepatosplenomegaly. Bowel sounds positive.  Musculoskeletal: no clubbing / cyanosis. No joint deformity upper and lower extremities. Good ROM, no contractures. Normal muscle tone.  Skin: forehead with erythema and edema, very tender to palpation and increased local temperature, ill defined margins, no purulence or blisters noted.  Neurologic: CN 2-12 grossly intact. Sensation intact, DTR normal. Strength 5/5 in all 4.    Labs on Admission: I have personally reviewed following labs and  imaging studies  CBC:  Recent Labs Lab 03/02/16 0944  WBC 19.8*  NEUTROABS 17.0*  HGB 12.5  HCT 36.9  MCV 83.7  PLT [id number]   Basic Metabolic Panel:  Recent Labs Lab 03/02/16 0944  NA 134*  K 3.1*  CL 102  CO2 22  GLUCOSE 143*  BUN 8  CREATININE 0.88  CALCIUM 8.3*   GFR: Estimated Creatinine Clearance: 87.2 mL/min (by C-G formula based on SCr of 0.88 mg/dL). Liver Function Tests: No results for input(s): AST, ALT, ALKPHOS, BILITOT, PROT, ALBUMIN in the last 168 hours. No results for input(s): LIPASE, AMYLASE in the last 168 hours. No results for input(s): AMMONIA in the last 168 hours. Coagulation Profile: No results for input(s): INR, PROTIME in the last 168 hours. Cardiac Enzymes: No results for input(s): CKTOTAL, CKMB, CKMBINDEX, TROPONINI in the last 168 hours. BNP (  last 3 results) No results for input(s): PROBNP in the last 8760 hours. HbA1C: No results for input(s): HGBA1C in the last 72 hours. CBG: No results for input(s): GLUCAP in the last 168 hours. Lipid Profile: No results for input(s): CHOL, HDL, LDLCALC, TRIG, CHOLHDL, LDLDIRECT in the last 72 hours. Thyroid Function Tests: No results for input(s): TSH, T4TOTAL, FREET4, T3FREE, THYROIDAB in the last 72 hours. Anemia Panel: No results for input(s): VITAMINB12, FOLATE, FERRITIN, TIBC, IRON, RETICCTPCT in the last 72 hours. Urine analysis:    Component Value Date/Time   COLORURINE YELLOW 03/02/2016 1110   APPEARANCEUR CLOUDY (A) 03/02/2016 1110   LABSPEC 1.012 03/02/2016 1110   PHURINE 6.0 03/02/2016 1110   GLUCOSEU NEGATIVE 03/02/2016 1110   HGBUR SMALL (A) 03/02/2016 1110   BILIRUBINUR NEGATIVE 03/02/2016 1110   KETONESUR NEGATIVE 03/02/2016 1110   PROTEINUR NEGATIVE 03/02/2016 1110   UROBILINOGEN 0.2 12/16/2012 1000   NITRITE NEGATIVE 03/02/2016 1110   LEUKOCYTESUR SMALL (A) 03/02/2016 1110   Sepsis Labs:  !!!!!!!!!!!!!!!!!!!!!!!!!!!!!!!!!!!!!!!!!!!! @LABRCNTIP (procalcitonin:4,lacticidven:4) ) Recent Results (from the past 240 hour(s))  Blood culture (routine x 2)     Status: None   Collection Time: 02/24/16  8:40 AM  Result Value Ref Range Status   Specimen Description BLOOD RIGHT ARM  Final   Special Requests BOTTLES DRAWN AEROBIC AND ANAEROBIC 5CC EACH  Final   Culture   Final    NO GROWTH 5 DAYS Performed at [facility], [place] [address], [place], [place] [address]    Report Status 02/29/2016 FINAL  Final  Blood culture (routine x 2)     Status: None   Collection Time: 02/24/16  8:46 AM  Result Value Ref Range Status   Specimen Description BLOOD LEFT ARM  Final   Special Requests BOTTLES DRAWN AEROBIC AND ANAEROBIC 5CC EACH  Final   Culture   Final    NO GROWTH 5 DAYS Performed at [facility], [place] [address], [place], [place] [address]    Report Status 02/29/2016 FINAL  Final     Radiological Exams on Admission: Dg Chest 2 View  Result Date: 03/02/2016 CLINICAL DATA:  Fever and chills over the last 2 days duration. EXAM: CHEST  2 VIEW COMPARISON:  11/30/2014 FINDINGS: Heart size is normal. Mediastinal shadows are normal. The lungs are clear. No bronchial thickening. No infiltrate, mass, effusion or collapse. Pulmonary vascularity is normal. No bony abnormality. IMPRESSION: Normal chest Electronically Signed   By: Nelson Chimes M.D.   On: 03/02/2016 10:50    EKG: Independently reviewed. Sinus rhythm, RR prime pattern on the precordial leads. Prolonged QT interval  Assessment/Plan Active Problems:   Sepsis [facility])   This is a 36 year old female who has been transferred from [facility] emergency department due to recurrent facial cellulitis. Right facial cellulitis, zoster related has improved but now she has worsened rash on her forehead. On initial physical examination she was febrile 101.1, blood pressure 132/93, heart rate 99, respiratory rate 18, oxygen saturation  99% on room air. He developed hypotension 92/56. The right face has crusted lesions, no blisters, the nose has been not effective. The forehead has significant erythema and tenderness, positive cervical lymphadenopathy. Sodium 134, potassium 3.1, chloride 102, bicarbonate 22, glucose 143, creatinine 0.8, BUN 8, white count 19.8, hemoglobin 12.5, hematocrit 36.9, platelets 317, lactic 3. 3 repeat 1.3. Urine analysis negative for infection, positive for trichomonas. Chest x-ray personally reviewed, negative for infiltrates.  Working diagnosis. Sepsis due to recurrent facial cellulitis.  1. Sepsis due to recurrent facial cellulitis. Start patient on broad spectrum antibiotics, vancomycin and Zosyn intravenously. Will follow-up on blood cultures, cell count and temperature curve. Will continue isotonic saline intravenously, targeting a mean arterial pressure of 65. Clinically no signs of active zoster lesions. Lactic acid is trending down.   2. Hypokalemia. Electrolytes were corrected in the emergency department, follow-up on the morning.  3. Trichomonas. Treated in the emergency department.   DVT prophylaxis: enoxaparin  Code Status: full  Family Communication: No family at the bedside  Disposition Plan: Home  Consults called: None  Admission status: Inpatient    Valerie Walter Gerome Apley MD [facility] Pager [phone]  If 7PM-7AM, please contact night-coverage [email or web address] Password [facility]  03/02/2016, 2:34 PM

## 2016-03-02 NOTE — Progress Notes (Addendum)
Pharmacy Antibiotic Note  Valerie Walter is a 36 y.o. female admitted on 03/02/2016 with swelling of head and neck concerning for cellulitis +/- abscess.  Pharmacy has been consulted for vancomycin and Zosyn dosing. Vancomycin 1g and Zosyn 3/375g IV x1 ordered by MD.   CrCl ~95 mL/min based off SCr from 1/13.  Plan: Vancomycin 1g IV x1 (as ordered) then 750mg  IV every 8 hours.  Goal trough 15-20 mcg/mL. Zosyn 3.375g IV q8h (4 hour infusion).  Monitor renal function, culture results, and clinical status.  Repeat BMET and adjust antibiotics if needed.   Height: 5\' 3"  (160 cm) Weight: 171 lb (77.6 kg) IBW/kg (Calculated) : 52.4  Temp (24hrs), Avg:101.1 F (38.4 C), Min:101.1 F (38.4 C), Max:101.1 F (38.4 C)   Recent Labs Lab 02/24/16 1021 02/24/16 1105 03/02/16 0958  CREATININE 0.72  --   --   LATICACIDVEN  --  1.03 3.33*    Estimated Creatinine Clearance: 95.9 mL/min (by C-G formula based on SCr of 0.72 mg/dL).    Allergies  Allergen Reactions  . Other Hives    All nuts    Antimicrobials this admission: Vancomycin 1/20 >> Zosyn 1/20 >>  Dose adjustments this admission:   Microbiology results: 1/20 BCx:  1/20 UCx:    Thank you for allowing pharmacy to be a part of this patient's care.  Sloan Leiter, PharmD, BCPS Clinical Pharmacist Clinical Phone 03/02/2016 until 3:30 PM - [phone] After hours, please call #[phone] 03/02/2016 10:04 AM

## 2016-03-03 DIAGNOSIS — E876 Hypokalemia: Secondary | ICD-10-CM | POA: Diagnosis present

## 2016-03-03 DIAGNOSIS — A599 Trichomoniasis, unspecified: Secondary | ICD-10-CM | POA: Diagnosis present

## 2016-03-03 DIAGNOSIS — L03211 Cellulitis of face: Secondary | ICD-10-CM | POA: Diagnosis present

## 2016-03-03 LAB — COMPREHENSIVE METABOLIC PANEL
ALBUMIN: 2.7 g/dL — AB (ref 3.5–5.0)
ALK PHOS: 57 U/L (ref 38–126)
ALT: 19 U/L (ref 14–54)
ANION GAP: 5 (ref 5–15)
AST: 17 U/L (ref 15–41)
BUN: 7 mg/dL (ref 6–20)
CALCIUM: 7.7 mg/dL — AB (ref 8.9–10.3)
CO2: 23 mmol/L (ref 22–32)
Chloride: 107 mmol/L (ref 101–111)
Creatinine, Ser: 0.68 mg/dL (ref 0.44–1.00)
GFR calc Af Amer: >60 mL/min (ref >60)
GFR calc non Af Amer: >60 mL/min (ref >60)
GLUCOSE: 147 mg/dL — AB (ref 65–99)
POTASSIUM: 3.1 mmol/L — AB (ref 3.5–5.1)
SODIUM: 135 mmol/L (ref 135–145)
Total Bilirubin: 0.5 mg/dL (ref 0.3–1.2)
Total Protein: 5.9 g/dL — ABNORMAL LOW (ref 6.5–8.1)

## 2016-03-03 LAB — CBC
HEMATOCRIT: 31.5 % — AB (ref 36.0–46.0)
HEMOGLOBIN: 10.5 g/dL — AB (ref 12.0–15.0)
MCH: 27.9 pg (ref 26.0–34.0)
MCHC: 33.3 g/dL (ref 30.0–36.0)
MCV: 83.6 fL (ref 78.0–100.0)
Platelets: 322 10*3/uL (ref 150–400)
RBC: 3.77 MIL/uL — ABNORMAL LOW (ref 3.87–5.11)
RDW: 15.4 % (ref 11.5–15.5)
WBC: 12.7 10*3/uL — ABNORMAL HIGH (ref 4.0–10.5)

## 2016-03-03 LAB — HIV ANTIBODY (ROUTINE TESTING W REFLEX): HIV Screen 4th Generation wRfx: NONREACTIVE

## 2016-03-03 LAB — URINE CULTURE

## 2016-03-03 MED ORDER — ONDANSETRON HCL 4 MG/2ML IJ SOLN
4.0000 mg | Freq: Once | INTRAMUSCULAR | Status: AC
  Administered 2016-03-03: 4 mg via INTRAVENOUS
  Filled 2016-03-03: qty 2

## 2016-03-03 MED ORDER — DIPHENHYDRAMINE-ZINC ACETATE 2-0.1 % EX CREA
TOPICAL_CREAM | Freq: Three times a day (TID) | CUTANEOUS | Status: DC | PRN
  Administered 2016-03-03: 1 via TOPICAL
  Filled 2016-03-03: qty 28

## 2016-03-03 MED ORDER — POTASSIUM CHLORIDE CRYS ER 20 MEQ PO TBCR
40.0000 meq | EXTENDED_RELEASE_TABLET | ORAL | Status: DC
  Administered 2016-03-03: 40 meq via ORAL
  Filled 2016-03-03: qty 2

## 2016-03-03 MED ORDER — KETOROLAC TROMETHAMINE 30 MG/ML IJ SOLN
30.0000 mg | Freq: Once | INTRAMUSCULAR | Status: AC
  Administered 2016-03-03: 30 mg via INTRAVENOUS
  Filled 2016-03-03: qty 1

## 2016-03-03 MED ORDER — VALACYCLOVIR HCL 500 MG PO TABS
1000.0000 mg | ORAL_TABLET | Freq: Two times a day (BID) | ORAL | Status: DC
  Administered 2016-03-03 (×2): 1000 mg via ORAL
  Filled 2016-03-03 (×2): qty 2

## 2016-03-03 NOTE — Progress Notes (Signed)
Patient said she is leaving AMA.  She stated that she "I have to go back to work and take care of my daughter".  Informed her of the risks of leaving AMA and she said she understood.  Pt did sign AMA paperwork.  Informed. Dr. Maylene Roes.  Irven Baltimore, RN

## 2016-03-03 NOTE — Progress Notes (Signed)
PROGRESS NOTE    Valerie Walter  [id number] DOB: 11/29/1980 DOA: 03/02/2016 PCP: [facility]     Brief Narrative:  Valerie Walter is a 36 y.o. female with medical history significant of recent right  facial zoster with cellulitis. Patient was unable to complete antibiotic therapy due to nausea and vomiting, only took about 4 days. Her facial rash and blisters improved at the level of the upper maxillary region, but for the last 2 days a new rash has appeared on her forehead. Rash has been worsening, affecting the forehead and extending into the scalp, it is very tender, associated with poor appetite, fever, chills and neck pain. No improving or worsening factors. Due to to worsening symptoms she returned to the emergency room. She was treated with empiric IV antibiotics Vanco and Zosyn.  Assessment & Plan:   Principal Problem:   Facial cellulitis Active Problems:   Sepsis ([place])   Hypokalemia   Trichomoniasis  Sepsis secondary to recurrent facial cellulitis -Fever 101.1, WBC 19.8 at time of admission with elevated lactic acid of 3.33. Lactic acidosis resolved, WBC and VS improving with IV antibiotics  -Vanco, zosyn  -Blood cultures pending -IVF  Hypokalemia -Replace  Trichomonas -Flagyl x 1 in ED    DVT prophylaxis: lovenox Code Status: full Family Communication: no family at bedside Disposition Plan: pending further improvement, blood culture results   Consultants:   none  Procedures:   none  Antimicrobials:  Anti-infectives    Start     Dose/Rate Route Frequency Ordered Stop   03/03/16 0115  valACYclovir (VALTREX) tablet 1,000 mg     1,000 mg Oral 2 times daily 03/03/16 0108     03/02/16 1800  piperacillin-tazobactam (ZOSYN) IVPB 3.375 g     3.375 g 12.5 mL/hr over 240 Minutes Intravenous Every 8 hours 03/02/16 1013     03/02/16 1800  vancomycin (VANCOCIN) IVPB 750 mg/150 ml premix     750 mg 150 mL/hr over 60 Minutes Intravenous  Every 8 hours 03/02/16 1013     03/02/16 1445  piperacillin-tazobactam (ZOSYN) IVPB 3.375 g  Status:  Discontinued     3.375 g 100 mL/hr over 30 Minutes Intravenous  Once 03/02/16 1444 03/02/16 1448   03/02/16 1445  vancomycin (VANCOCIN) IVPB 1000 mg/200 mL premix  Status:  Discontinued     1,000 mg 200 mL/hr over 60 Minutes Intravenous  Once 03/02/16 1444 03/02/16 1448   03/02/16 1230  metroNIDAZOLE (FLAGYL) tablet 2,000 mg     2,000 mg Oral  Once 03/02/16 1229 03/02/16 1232   03/02/16 1015  piperacillin-tazobactam (ZOSYN) IVPB 3.375 g     3.375 g 100 mL/hr over 30 Minutes Intravenous  Once 03/02/16 1002 03/02/16 1109   03/02/16 1015  vancomycin (VANCOCIN) IVPB 1000 mg/200 mL premix     1,000 mg 200 mL/hr over 60 Minutes Intravenous  Once 03/02/16 1002 03/02/16 1127   03/02/16 0945  clindamycin (CLEOCIN) IVPB 600 mg  Status:  Discontinued     600 mg 100 mL/hr over 30 Minutes Intravenous  Once 03/02/16 0940 03/02/16 1002        Subjective: She states that she is feeling better today. She notes that the pressure is always on the low side. She states that her head and right facial swelling has improved since getting IV antibiotics. No other complaints. No chest pains or shortness of breath, no nausea, vomiting or diarrhea or abdominal pain on my exam. She states that she must leave  the hospital today because if she does not make into work tomorrow, she may get fired. I explained my reasoning for continued evaluation and continued IV antibiotic therapy.  Objective: Vitals:   03/03/16 0400 03/03/16 0500 03/03/16 0800 03/03/16 1025  BP: 97/65 (!) 88/62  111/73  Pulse: (!) 55 (!) 57    Resp: (!) 8 17  (!) 24  Temp:   98.7 F (37.1 C)   TempSrc:   Oral   SpO2: 100% 99%    Weight:      Height:        Intake/Output Summary (Last 24 hours) at 03/03/16 1108 Last data filed at 03/03/16 0700  Gross per 24 hour  Intake          1556.67 ml  Output              800 ml  Net            756.67 ml   Filed Weights   03/02/16 0921  Weight: 77.6 kg (171 lb)    Examination:  General exam: Appears calm and comfortable  Respiratory system: Clear to auscultation. Respiratory effort normal. Cardiovascular system: S1 & S2 heard, RRR. No JVD, murmurs, rubs, gallops or clicks. No pedal edema. Gastrointestinal system: Abdomen is nondistended, soft and nontender. No organomegaly or masses felt. Normal bowel sounds heard. Central nervous system: Alert and oriented. No focal neurological deficits. Extremities: Symmetric 5 x 5 power. Skin: +crusting and dry skin over right cheek and forehead, no open wounds or areas of blistering. +warmth and edema over forehead  Psychiatry: Judgement and insight appear normal. Mood & affect appropriate.   Data Reviewed: I have personally reviewed following labs and imaging studies  CBC:  Recent Labs Lab 03/02/16 0944 03/02/16 1815 03/03/16 0320  WBC 19.8* 13.8* 12.7*  NEUTROABS 17.0*  --   --   HGB 12.5 10.7* 10.5*  HCT 36.9 31.8* 31.5*  MCV 83.7 83.2 83.6  PLT 379 337 [id number]   Basic Metabolic Panel:  Recent Labs Lab 03/02/16 0944 03/02/16 1815 03/03/16 0320  NA 134*  --  135  K 3.1*  --  3.1*  CL 102  --  107  CO2 22  --  23  GLUCOSE 143*  --  147*  BUN 8  --  7  CREATININE 0.88 0.69 0.68  CALCIUM 8.3*  --  7.7*   GFR: Estimated Creatinine Clearance: 95.9 mL/min (by C-G formula based on SCr of 0.68 mg/dL). Liver Function Tests:  Recent Labs Lab 03/03/16 0320  AST 17  ALT 19  ALKPHOS 57  BILITOT 0.5  PROT 5.9*  ALBUMIN 2.7*   No results for input(s): LIPASE, AMYLASE in the last 168 hours. No results for input(s): AMMONIA in the last 168 hours. Coagulation Profile: No results for input(s): INR, PROTIME in the last 168 hours. Cardiac Enzymes: No results for input(s): CKTOTAL, CKMB, CKMBINDEX, TROPONINI in the last 168 hours. BNP (last 3 results) No results for input(s): PROBNP in the last 8760 hours. HbA1C: No  results for input(s): HGBA1C in the last 72 hours. CBG: No results for input(s): GLUCAP in the last 168 hours. Lipid Profile: No results for input(s): CHOL, HDL, LDLCALC, TRIG, CHOLHDL, LDLDIRECT in the last 72 hours. Thyroid Function Tests: No results for input(s): TSH, T4TOTAL, FREET4, T3FREE, THYROIDAB in the last 72 hours. Anemia Panel: No results for input(s): VITAMINB12, FOLATE, FERRITIN, TIBC, IRON, RETICCTPCT in the last 72 hours. Sepsis Labs:  Recent Labs Lab 03/02/16 [phone]  03/02/16 1520 03/02/16 1815 03/02/16 2120  LATICACIDVEN 3.33* 1.3 1.1 1.2    Recent Results (from the past 240 hour(s))  Blood culture (routine x 2)     Status: None   Collection Time: 02/24/16  8:40 AM  Result Value Ref Range Status   Specimen Description BLOOD RIGHT ARM  Final   Special Requests BOTTLES DRAWN AEROBIC AND ANAEROBIC 5CC EACH  Final   Culture   Final    NO GROWTH 5 DAYS Performed at [facility], [place] [address] [place], [place] [address]    Report Status 02/29/2016 FINAL  Final  Blood culture (routine x 2)     Status: None   Collection Time: 02/24/16  8:46 AM  Result Value Ref Range Status   Specimen Description BLOOD LEFT ARM  Final   Special Requests BOTTLES DRAWN AEROBIC AND ANAEROBIC 5CC EACH  Final   Culture   Final    NO GROWTH 5 DAYS Performed at [facility], [place] [address] [place], [place] [address]    Report Status 02/29/2016 FINAL  Final       Radiology Studies: Dg Chest 2 View  Result Date: 03/02/2016 CLINICAL DATA:  Fever and chills over the last 2 days duration. EXAM: CHEST  2 VIEW COMPARISON:  11/30/2014 FINDINGS: Heart size is normal. Mediastinal shadows are normal. The lungs are clear. No bronchial thickening. No infiltrate, mass, effusion or collapse. Pulmonary vascularity is normal. No bony abnormality. IMPRESSION: Normal chest Electronically Signed   By: Nelson Chimes M.D.   On: 03/02/2016 10:50      Scheduled Meds: . enoxaparin  (LOVENOX) injection  40 mg Subcutaneous Q24H  . piperacillin-tazobactam (ZOSYN)  IV  3.375 g Intravenous Q8H  . potassium chloride  40 mEq Oral Q4H  . valACYclovir  1,000 mg Oral BID  . vancomycin  750 mg Intravenous Q8H   Continuous Infusions: . sodium chloride 100 mL/hr at 03/03/16 0054     LOS: 1 day    Time spent: 40 minutes   Dessa Phi, DO [facility] [email or web address] Password [email or web address] 03/03/2016, 11:08 AM

## 2016-03-07 LAB — CULTURE, BLOOD (ROUTINE X 2)
Culture: NO GROWTH
Culture: NO GROWTH

## 2016-03-15 NOTE — Discharge Summary (Signed)
Physician Discharge Summary  Valerie Walter [id number] DOB: 09/09/1980 DOA: 03/02/2016  PCP: [facility]  Admit date: 03/02/2016 Discharge date: 03/15/2016  Admitted From: Home Disposition:  Left against medical advice   Brief/Interim Summary: Valerie Walter a 36 y.o.femalewith medical history significant of recent right facial zoster with cellulitis. Patient was unable to complete antibiotic therapy due to nausea and vomiting, only took about 4 days. Her facial rash and blisters improved at the level of the upper maxillary region, but for the last 2 days a new rash has appeared on her forehead. Rash has been worsening, affecting the forehead and extending into the scalp, it is very tender, associated with poor appetite, fever, chills and neck pain. No improving or worsening factors. Due to to worsening symptoms she returned to the emergency room. She was treated with empiric IV antibiotics Vanco and Zosyn.  Subjective on day of discharge: She states that she is feeling better today. She notes that the pressure is always on the low side. She states that her head and right facial swelling has improved since getting IV antibiotics. No other complaints. No chest pains or shortness of breath, no nausea, vomiting or diarrhea or abdominal pain on my exam. She has decided to leave against medical advice.   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 Lactic acidosis resolved, WBC and VS improving with IV antibiotics  -Vanco, zosyn  -Blood cultures pending -- reviewed: negative at 5 days  -IVF  Hypokalemia -Replaced  Trichomonas -Flagyl x 1 in ED   Discharge Instructions   Allergies as of 03/03/2016      Reactions   Other Hives   All nuts       Medication List    ASK your doctor about these medications   acyclovir 800 MG tablet Commonly known as:  ZOVIRAX Take 1 tablet (800 mg total) by mouth 5 (five) times daily.   albuterol 108 (90 Base) MCG/ACT inhaler Commonly known as:  PROVENTIL HFA;VENTOLIN HFA Inhale 1 puff into the lungs every 6 (six) hours as needed for wheezing or shortness of breath.   clindamycin 150 MG capsule Commonly known as:  CLEOCIN Take 3 capsules (450 mg total) by mouth 3 (three) times daily. Ask about: Should I take this medication?   ibuprofen 600 MG tablet Commonly known as:  ADVIL,MOTRIN Take 1 tablet (600 mg total) by mouth every 6 (six) hours as needed.   medroxyPROGESTERone 150 MG/ML injection Commonly known as:  DEPO-PROVERA Inject 150 mg into the muscle every 3 (three) months. Pt is having next dose on 02/16/16   metroNIDAZOLE 500 MG tablet Commonly known as:  FLAGYL Take two tablets by mouth twice a day, for one day.  Or you can take all four tablets at once if you can tolerate it.   naproxen sodium 220 MG tablet Commonly known as:  ANAPROX Take 440 mg by mouth 2 (two) times daily with a meal.   predniSONE 10 MG tablet Commonly known as:  DELTASONE Take 10 mg by mouth daily.   predniSONE 20 MG tablet Commonly known as:  DELTASONE Take 60mg  x 2 days, 40mg  x 2 days, 20mg  x 3 days   promethazine 25 MG tablet Commonly known as:  PHENERGAN Take 0.5-1 tablets (12.5-25 mg total)  by mouth every 6 (six) hours as needed.   sulfamethoxazole-trimethoprim 800-160 MG tablet Commonly known as:  BACTRIM DS,SEPTRA DS Take 1 tablet by mouth 2 (two) times daily.       Allergies  Allergen Reactions  . Other Hives    All nuts    Consultations:  None   Procedures/Studies: Dg Chest 2 View  Result Date: 03/02/2016 CLINICAL DATA:  Fever and chills over the last 2 days duration. EXAM: CHEST  2 VIEW COMPARISON:  11/30/2014 FINDINGS: Heart size is normal. Mediastinal shadows are normal.  The lungs are clear. No bronchial thickening. No infiltrate, mass, effusion or collapse. Pulmonary vascularity is normal. No bony abnormality. IMPRESSION: Normal chest Electronically Signed   By: Nelson Chimes M.D.   On: 03/02/2016 10:50   Ct Head W Or Wo Contrast  Result Date: 02/24/2016 CLINICAL DATA:  Rash with blistering of the right face and neck for the past 2 days. Dizziness. Blurred vision. Left dental pain. EXAM: CT HEAD WITHOUT AND WITH CONTRAST CT MAXILLOFACIAL WITH CONTRAST TECHNIQUE: Multidetector CT imaging of the head was performed using the standard protocol without IV contrast. CT imaging of the maxillofacial structures was performed with intravenous contrast. Multiplanar CT image reconstructions were also generated. CONTRAST:  176mL ISOVUE-300 IOPAMIDOL (ISOVUE-300) INJECTION 61%<Contrast>139mL ISOVUE-300 IOPAMIDOL (ISOVUE-300) INJECTION 61% COMPARISON:  Maxillofacial CT with contrast 02/22/2016 and head CT dated 02/23/2008. FINDINGS: CT HEAD FINDINGS Brain: Normal appearing cerebral hemispheres and posterior fossa structures. Normal size and position of the ventricles. No intracranial hemorrhage, mass lesion, infarction or abnormal enhancement seen. Vascular: Normal appearing vessels. Normal opacification of both cavernous sinuses. Skull: Normal. Negative for fracture or focal lesion. Other: None. CT MAXILLOFACIAL Osseous: Unremarkable bones. Large cavity in the most posterior upper left molar. Orbits: Negative. No traumatic or inflammatory finding. Sinuses: Clear. Soft tissues: Mild right facial subcutaneous edema without significant change. No soft tissue air or fluid collections. IMPRESSION: 1. No significant change in right facial cellulitis without abscess. 2. No intracranial abnormality. 3. Large cavity in the most posterior upper left molar. Electronically Signed   By: Claudie Revering M.D.   On: 02/24/2016 12:36   Ct Maxillofacial W Contrast  Result Date: 02/24/2016 CLINICAL DATA:  Rash  with blistering of the right face and neck for the past 2 days. Dizziness. Blurred vision. Left dental pain. EXAM: CT HEAD WITHOUT AND WITH CONTRAST CT MAXILLOFACIAL WITH CONTRAST TECHNIQUE: Multidetector CT imaging of the head was performed using the standard protocol without IV contrast. CT imaging of the maxillofacial structures was performed with intravenous contrast. Multiplanar CT image reconstructions were also generated. CONTRAST:  13mL ISOVUE-300 IOPAMIDOL (ISOVUE-300) INJECTION 61%<Contrast>178mL ISOVUE-300 IOPAMIDOL (ISOVUE-300) INJECTION 61% COMPARISON:  Maxillofacial CT with contrast 02/22/2016 and head CT dated 02/23/2008. FINDINGS: CT HEAD FINDINGS Brain: Normal appearing cerebral hemispheres and posterior fossa structures. Normal size and position of the ventricles. No intracranial hemorrhage, mass lesion, infarction or abnormal enhancement seen. Vascular: Normal appearing vessels. Normal opacification of both cavernous sinuses. Skull: Normal. Negative for fracture or focal lesion. Other: None. CT MAXILLOFACIAL Osseous: Unremarkable bones. Large cavity in the most posterior upper left molar. Orbits: Negative. No traumatic or inflammatory finding. Sinuses: Clear. Soft tissues: Mild right facial subcutaneous edema without significant change. No soft tissue air or fluid collections. IMPRESSION: 1. No significant change in right facial cellulitis without abscess. 2. No intracranial abnormality. 3. Large cavity in the most posterior upper left molar. Electronically Signed   By: Claudie Revering M.D.   On: 02/24/2016 12:36  Ct Maxillofacial W Contrast  Result Date: 02/22/2016 CLINICAL DATA:  Right facial swelling EXAM: CT MAXILLOFACIAL WITH CONTRAST TECHNIQUE: Multidetector CT imaging of the maxillofacial structures was performed. Multiplanar CT image reconstructions were also generated. A small metallic BB was placed on the right temple in order to reliably differentiate right from left. COMPARISON:   None. FINDINGS: Osseous: No fracture or mandibular dislocation. No destructive process. Orbits: Negative. No traumatic or inflammatory finding. Sinuses: Clear. Soft tissues: Mild soft tissue swelling is noted on the right. This consists of some subcutaneous edema as well as considerable skin thickening. No focal abscess is seen. A few small periauricular reactive lymph nodes are noted. Limited intracranial: Within normal limits. IMPRESSION: Changes consistent with right facial cellulitis. No focal abscess is seen. Some reactive lymphadenopathy is identified. Electronically Signed   By: Inez Catalina M.D.   On: 02/22/2016 13:13      Discharge Exam: Vitals:   03/03/16 1200 03/03/16 1300  BP: 125/71 127/87  Pulse:    Resp: 15 (!) 24  Temp: 98.9 F (37.2 C)    Vitals:   03/03/16 1025 03/03/16 1100 03/03/16 1200 03/03/16 1300  BP: 111/73 119/84 125/71 127/87  Pulse:      Resp: (!) 24 11 15  (!) 24  Temp:   98.9 F (37.2 C)   TempSrc:   Oral   SpO2:      Weight:      Height:        Examination:  General exam: Appears calm and comfortable  Respiratory system: Clear to auscultation. Respiratory effort normal. Cardiovascular system: S1 & S2 heard, RRR. No JVD, murmurs, rubs, gallops or clicks. No pedal edema. Gastrointestinal system: Abdomen is nondistended, soft and nontender. No organomegaly or masses felt. Normal bowel sounds heard. Central nervous system: Alert and oriented. No focal neurological deficits. Extremities: Symmetric 5 x 5 power. Skin: +crusting and dry skin over right cheek and forehead, no open wounds or areas of blistering. +warmth and edema over forehead  Psychiatry: Judgement and insight appear normal. Mood & affect appropriate.    The results of significant diagnostics from this hospitalization (including imaging, microbiology, ancillary and laboratory) are listed below for reference.     Microbiology: No results found for this or any previous visit (from the past  240 hour(s)).   Labs: BNP (last 3 results) No results for input(s): BNP in the last 8760 hours. Basic Metabolic Panel: No results for input(s): NA, K, CL, CO2, GLUCOSE, BUN, CREATININE, CALCIUM, MG, PHOS in the last 168 hours. Liver Function Tests: No results for input(s): AST, ALT, ALKPHOS, BILITOT, PROT, ALBUMIN in the last 168 hours. No results for input(s): LIPASE, AMYLASE in the last 168 hours. No results for input(s): AMMONIA in the last 168 hours. CBC: No results for input(s): WBC, NEUTROABS, HGB, HCT, MCV, PLT in the last 168 hours. Cardiac Enzymes: No results for input(s): CKTOTAL, CKMB, CKMBINDEX, TROPONINI in the last 168 hours. BNP: Invalid input(s): POCBNP CBG: No results for input(s): GLUCAP in the last 168 hours. D-Dimer No results for input(s): DDIMER in the last 72 hours. Hgb A1c No results for input(s): HGBA1C in the last 72 hours. Lipid Profile No results for input(s): CHOL, HDL, LDLCALC, TRIG, CHOLHDL, LDLDIRECT in the last 72 hours. Thyroid function studies No results for input(s): TSH, T4TOTAL, T3FREE, THYROIDAB in the last 72 hours.  Invalid input(s): FREET3 Anemia work up No results for input(s): VITAMINB12, FOLATE, FERRITIN, TIBC, IRON, RETICCTPCT in the last 72 hours. Urinalysis  Component Value Date/Time   COLORURINE YELLOW 03/02/2016 1110   APPEARANCEUR CLOUDY (A) 03/02/2016 1110   LABSPEC 1.012 03/02/2016 1110   PHURINE 6.0 03/02/2016 1110   GLUCOSEU NEGATIVE 03/02/2016 1110   HGBUR SMALL (A) 03/02/2016 1110   BILIRUBINUR NEGATIVE 03/02/2016 1110   KETONESUR NEGATIVE 03/02/2016 1110   PROTEINUR NEGATIVE 03/02/2016 1110   UROBILINOGEN 0.2 12/16/2012 1000   NITRITE NEGATIVE 03/02/2016 1110   LEUKOCYTESUR SMALL (A) 03/02/2016 1110   Sepsis Labs Invalid input(s): PROCALCITONIN,  WBC,  LACTICIDVEN Microbiology No results found for this or any previous visit (from the past 240 hour(s)).   Time coordinating discharge: Over 30  minutes  SIGNED:  Dessa Phi, DO [facility] Pager [phone]  If 7PM-7AM, please contact night-coverage [email or web address] Password [email or web address] 03/15/2016, 1:57 PM

## 2016-04-11 ENCOUNTER — Encounter (HOSPITAL_COMMUNITY): Payer: Self-pay | Admitting: Emergency Medicine

## 2016-04-11 ENCOUNTER — Ambulatory Visit (HOSPITAL_COMMUNITY)
Admission: EM | Admit: 2016-04-11 | Discharge: 2016-04-11 | Disposition: A | Discharge status: Home / Self Care | Payer: Medicaid Other | Attending: Internal Medicine | Admitting: Internal Medicine

## 2016-04-11 DIAGNOSIS — R21 Rash and other nonspecific skin eruption: Secondary | ICD-10-CM | POA: Diagnosis not present

## 2016-04-11 DIAGNOSIS — W57XXXA Bitten or stung by nonvenomous insect and other nonvenomous arthropods, initial encounter: Secondary | ICD-10-CM

## 2016-04-11 MED ORDER — DOXYCYCLINE HYCLATE 100 MG PO CAPS: 100.0000 mg | ORAL_CAPSULE | Freq: Two times a day (BID) | ORAL | 0 refills | Status: AC

## 2016-04-11 MED ORDER — TRIAMCINOLONE ACETONIDE 0.1 % EX CREA: 1.0000 "application " | TOPICAL_CREAM | Freq: Two times a day (BID) | CUTANEOUS | 0 refills | Status: DC

## 2016-04-11 NOTE — Discharge Instructions (Signed)
For your insect bites, I prescribed triamcinolone cream, apply 2-3 times daily as needed for itching. Also started an antibiotic, doxycycline, take one tablet twice a day for 7 days. Should your symptoms persist, follow up with her primary care provider or return to clinic as needed. Should your symptoms worsen, such as spreading, if your bites become black, review see red streaking up your arms or legs, then go to the emergency room.

## 2016-04-11 NOTE — ED Triage Notes (Signed)
Patient has several areas that itch and are swollen.  The worst area is left hand.  Hand and base of index finger and middle finger.  No known injury.  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

## 2016-04-11 NOTE — ED Provider Notes (Signed)
CSN: [id number]     Arrival date & time 04/11/16  1348 History   None    Chief Complaint  Patient presents with  . Insect Bite   (Consider location/radiation/quality/duration/timing/severity/associated sxs/prior Treatment) 36 year old female presents to clinic with pain and itching on her left hand, as well as her left ankle. She reports that she may been bitten by spiders at a patient's residence. Said the itching is been present for 2-3 days. She states the "bites" have not spread, and she reports not having any new bites either. She denies any systemic symptoms such as fever, muscle aches, or nausea. She denies any redness or streaks coming up her leg or arm.   The history is provided by the patient.    Past Medical History:  Diagnosis Date  . Miscarriage   . Ovarian cyst   . Seasonal allergies   . Shingles   . Thyroid disease   . Trichomonas   . Umbilical hernia    Past Surgical History:  Procedure Laterality Date  . APPENDECTOMY    . DILATION AND CURETTAGE OF UTERUS    . IRRIGATION AND DEBRIDEMENT ABSCESS     Family History  Problem Relation Age of Onset  . Cancer Mother    Social History  Substance Use Topics  . Smoking status: Current Every Day Smoker    Packs/day: 3.00    Types: Cigarettes  . Smokeless tobacco: Never Used  . Alcohol use Yes     Comment: occasional   OB History    Gravida Para Term Preterm AB Living   5 2 2  0 3 2   SAB TAB Ectopic Multiple Live Births   2 1 0 0 2     Review of Systems  Reason unable to perform ROS: As covered in history of present illness.  All other systems reviewed and are negative.   Allergies  Other  Home Medications   Prior to Admission medications   Medication Sig Start Date End Date Taking? Authorizing Provider  acyclovir (ZOVIRAX) 800 MG tablet Take 1 tablet (800 mg total) by mouth 5 (five) times daily. Patient not taking: Reported on 03/02/2016 02/25/16   Smiley Houseman, MD  albuterol (PROVENTIL  HFA;VENTOLIN HFA) 108 (90 Base) MCG/ACT inhaler Inhale 1 puff into the lungs every 6 (six) hours as needed for wheezing or shortness of breath.    Historical Provider, MD  doxycycline (VIBRAMYCIN) 100 MG capsule Take 1 capsule (100 mg total) by mouth 2 (two) times daily. 04/11/16 04/18/16  Barnet Glasgow, NP  ibuprofen (ADVIL,MOTRIN) 600 MG tablet Take 1 tablet (600 mg total) by mouth every 6 (six) hours as needed. Patient not taking: Reported on 03/02/2016 02/08/16   Kathie Dike Leftwich-Kirby, CNM  medroxyPROGESTERone (DEPO-PROVERA) 150 MG/ML injection Inject 150 mg into the muscle every 3 (three) months. Pt is having next dose on 02/16/16    Historical Provider, MD  naproxen sodium (ANAPROX) 220 MG tablet Take 440 mg by mouth 2 (two) times daily with a meal.    Historical Provider, MD  predniSONE (DELTASONE) 10 MG tablet Take 10 mg by mouth daily. 01/01/16   Historical Provider, MD  predniSONE (DELTASONE) 20 MG tablet Take 60mg  x 2 days, 40mg  x 2 days, 20mg  x 3 days Patient not taking: Reported on 03/02/2016 02/24/16   Smiley Houseman, MD  promethazine (PHENERGAN) 25 MG tablet Take 0.5-1 tablets (12.5-25 mg total) by mouth every 6 (six) hours as needed. Patient not taking: Reported on 02/22/2016 02/08/16  Lisa A Leftwich-Kirby, CNM  triamcinolone cream (KENALOG) 0.1 % Apply 1 application topically 2 (two) times daily. 04/11/16   Barnet Glasgow, NP   Meds Ordered and Administered this Visit  Medications - No data to display  BP 123/78 (BP Location: Left Arm)   Pulse 75   Temp 98.3 F (36.8 C) (Oral)   Resp 20   SpO2 100%  No data found.   Physical Exam  Constitutional: She is oriented to person, place, and time. She appears well-developed and well-nourished. No distress.  HENT:  Head: Normocephalic and atraumatic.  Abdominal: Soft. Bowel sounds are normal.  Neurological: She is alert and oriented to person, place, and time.  Skin: Skin is warm and dry. Capillary refill takes less than 2  seconds. Rash noted. She is not diaphoretic.  4-5 small, urticarial, erythremic lesions on her left hand and 1 posterior aspect of her left ankle.  Psychiatric: She has a normal mood and affect.  Nursing note and vitals reviewed.   Urgent Care Course     Procedures (including critical care time)  Labs Review Labs Reviewed - No data to display  Imaging Review No results found.      MDM   1. Insect bite, initial encounter    For your insect bites, I prescribed triamcinolone cream, apply 2-3 times daily as needed for itching. Also started an antibiotic, doxycycline, take one tablet twice a day for 7 days. Should your symptoms persist, follow up with her primary care provider or return to clinic as needed. Should your symptoms worsen, such as spreading, if your bites become black, review see red streaking up your arms or legs, then go to the emergency room.      Barnet Glasgow, NP 04/11/16 1421

## 2016-05-24 ENCOUNTER — Ambulatory Visit (HOSPITAL_COMMUNITY)
Admission: EM | Admit: 2016-05-24 | Discharge: 2016-05-24 | Disposition: A | Discharge status: Home / Self Care | Payer: Medicaid Other | Attending: Family Medicine | Admitting: Family Medicine

## 2016-05-24 ENCOUNTER — Encounter (HOSPITAL_COMMUNITY): Payer: Self-pay | Admitting: Emergency Medicine

## 2016-05-24 DIAGNOSIS — W57XXXA Bitten or stung by nonvenomous insect and other nonvenomous arthropods, initial encounter: Secondary | ICD-10-CM

## 2016-05-24 DIAGNOSIS — L03113 Cellulitis of right upper limb: Secondary | ICD-10-CM | POA: Diagnosis not present

## 2016-05-24 MED ORDER — HYDROXYZINE HCL 25 MG PO TABS: 25.0000 mg | ORAL_TABLET | Freq: Four times a day (QID) | ORAL | 0 refills | Status: DC

## 2016-05-24 MED ORDER — METHYLPREDNISOLONE 4 MG PO TBPK: ORAL_TABLET | ORAL | 0 refills | Status: DC

## 2016-05-24 MED ORDER — DOXYCYCLINE HYCLATE 100 MG PO CAPS: 100.0000 mg | ORAL_CAPSULE | Freq: Two times a day (BID) | ORAL | 0 refills | Status: DC

## 2016-05-24 NOTE — ED Triage Notes (Signed)
PT suspects she was bit by an insect at work yesterday. PT has red, warm, swollen area over right elbow. PT has a smaller red swollen area on chin.

## 2016-05-24 NOTE — ED Provider Notes (Signed)
CSN: [id number]     Arrival date & time 05/24/16  1006 History   None    Chief Complaint  Patient presents with  . Insect Bite   (Consider location/radiation/quality/duration/timing/severity/associated sxs/prior Treatment) Pateint has warm red itchy rash over right elbow from insect bite yesterday.    Rash  Location:  Shoulder/arm Shoulder/arm rash location:  R arm Quality: itchiness and redness   Severity:  Moderate Onset quality:  Sudden Duration:  1 day Timing:  Constant Progression:  Worsening Chronicity:  New Associated symptoms: joint pain     Past Medical History:  Diagnosis Date  . Miscarriage   . Ovarian cyst   . Seasonal allergies   . Shingles   . Thyroid disease   . Trichomonas   . Umbilical hernia    Past Surgical History:  Procedure Laterality Date  . APPENDECTOMY    . DILATION AND CURETTAGE OF UTERUS    . IRRIGATION AND DEBRIDEMENT ABSCESS     Family History  Problem Relation Age of Onset  . Cancer Mother    Social History  Substance Use Topics  . Smoking status: Current Every Day Smoker    Packs/day: 1.00    Types: Cigarettes  . Smokeless tobacco: Never Used  . Alcohol use Yes     Comment: occasional   OB History    Gravida Para Term Preterm AB Living   5 2 2  0 3 2   SAB TAB Ectopic Multiple Live Births   2 1 0 0 2     Review of Systems  Constitutional: Negative.   HENT: Negative.   Eyes: Negative.   Respiratory: Negative.   Cardiovascular: Negative.   Gastrointestinal: Negative.   Endocrine: Negative.   Genitourinary: Negative.   Musculoskeletal: Positive for arthralgias.  Skin: Positive for rash.  Allergic/Immunologic: Negative.   Neurological: Negative.   Hematological: Negative.   Psychiatric/Behavioral: Negative.     Allergies  Other  Home Medications   Prior to Admission medications   Medication Sig Start Date End Date Taking? Authorizing Provider  acyclovir (ZOVIRAX) 800 MG tablet Take 1 tablet (800 mg total)  by mouth 5 (five) times daily. Patient not taking: Reported on 03/02/2016 02/25/16   Smiley Houseman, MD  albuterol (PROVENTIL HFA;VENTOLIN HFA) 108 (90 Base) MCG/ACT inhaler Inhale 1 puff into the lungs every 6 (six) hours as needed for wheezing or shortness of breath.    Historical Provider, MD  doxycycline (VIBRAMYCIN) 100 MG capsule Take 1 capsule (100 mg total) by mouth 2 (two) times daily. 05/24/16   Lysbeth Penner, FNP  hydrOXYzine (ATARAX/VISTARIL) 25 MG tablet Take 1 tablet (25 mg total) by mouth every 6 (six) hours. 05/24/16   Lysbeth Penner, FNP  ibuprofen (ADVIL,MOTRIN) 600 MG tablet Take 1 tablet (600 mg total) by mouth every 6 (six) hours as needed. Patient not taking: Reported on 03/02/2016 02/08/16   Kathie Dike Leftwich-Kirby, CNM  medroxyPROGESTERone (DEPO-PROVERA) 150 MG/ML injection Inject 150 mg into the muscle every 3 (three) months. Pt is having next dose on 02/16/16    Historical Provider, MD  methylPREDNISolone (MEDROL DOSEPAK) 4 MG TBPK tablet Take 6-5-4-3-2-1 po qd 05/24/16   Lysbeth Penner, FNP  naproxen sodium (ANAPROX) 220 MG tablet Take 440 mg by mouth 2 (two) times daily with a meal.    Historical Provider, MD  predniSONE (DELTASONE) 10 MG tablet Take 10 mg by mouth daily. 01/01/16   Historical Provider, MD  predniSONE (DELTASONE) 20 MG tablet Take 60mg  x  2 days, 40mg  x 2 days, 20mg  x 3 days Patient not taking: Reported on 03/02/2016 02/24/16   Smiley Houseman, MD  promethazine (PHENERGAN) 25 MG tablet Take 0.5-1 tablets (12.5-25 mg total) by mouth every 6 (six) hours as needed. Patient not taking: Reported on 02/22/2016 02/08/16   Kathie Dike Leftwich-Kirby, CNM  triamcinolone cream (KENALOG) 0.1 % Apply 1 application topically 2 (two) times daily. 04/11/16   Barnet Glasgow, NP   Meds Ordered and Administered this Visit  Medications - No data to display  BP 135/86   Pulse 75   Temp 98.9 F (37.2 C) (Oral)   Resp 16   Ht 5\' 3"  (1.6 m)   Wt 165 lb (74.8 kg)   SpO2  100%   BMI 29.23 kg/m  No data found.   Physical Exam  Constitutional: She appears well-developed and well-nourished.  HENT:  Head: Normocephalic and atraumatic.  Eyes: Conjunctivae and EOM are normal. Pupils are equal, round, and reactive to light.  Neck: Normal range of motion. Neck supple.  Cardiovascular: Normal rate, regular rhythm and normal heart sounds.   Pulmonary/Chest: Effort normal and breath sounds normal.  Skin: Rash noted.  Right forearm with swelling erythema rash  Nursing note and vitals reviewed.   Urgent Care Course     Procedures (including critical care time)  Labs Review Labs Reviewed - No data to display  Imaging Review No results found.   Visual Acuity Review  Right Eye Distance:   Left Eye Distance:   Bilateral Distance:    Right Eye Near:   Left Eye Near:    Bilateral Near:         MDM   1. Right forearm cellulitis   2. Insect bite, initial encounter    Medrol dose pack as directed Hydroxyzine Doxycycline      Lysbeth Penner, FNP 05/24/16 2048

## 2016-10-09 ENCOUNTER — Emergency Department (HOSPITAL_COMMUNITY): Payer: Medicaid Other

## 2016-10-09 ENCOUNTER — Encounter (HOSPITAL_COMMUNITY): Payer: Self-pay | Admitting: Nurse Practitioner

## 2016-10-09 ENCOUNTER — Emergency Department (HOSPITAL_COMMUNITY)
Admission: EM | Admit: 2016-10-09 | Discharge: 2016-10-09 | Disposition: A | Discharge status: Home / Self Care | Payer: Medicaid Other | Attending: Emergency Medicine | Admitting: Emergency Medicine

## 2016-10-09 DIAGNOSIS — M545 Low back pain, unspecified: Secondary | ICD-10-CM

## 2016-10-09 DIAGNOSIS — Z79899 Other long term (current) drug therapy: Secondary | ICD-10-CM | POA: Insufficient documentation

## 2016-10-09 DIAGNOSIS — N3001 Acute cystitis with hematuria: Secondary | ICD-10-CM | POA: Diagnosis not present

## 2016-10-09 DIAGNOSIS — R110 Nausea: Secondary | ICD-10-CM

## 2016-10-09 DIAGNOSIS — F17210 Nicotine dependence, cigarettes, uncomplicated: Secondary | ICD-10-CM | POA: Insufficient documentation

## 2016-10-09 DIAGNOSIS — A599 Trichomoniasis, unspecified: Secondary | ICD-10-CM | POA: Insufficient documentation

## 2016-10-09 DIAGNOSIS — R1030 Lower abdominal pain, unspecified: Secondary | ICD-10-CM

## 2016-10-09 LAB — COMPREHENSIVE METABOLIC PANEL
ALBUMIN: 3.9 g/dL (ref 3.5–5.0)
ALK PHOS: 52 U/L (ref 38–126)
ALT: 19 U/L (ref 14–54)
ANION GAP: 7 (ref 5–15)
AST: 19 U/L (ref 15–41)
BUN: 9 mg/dL (ref 6–20)
CALCIUM: 9.1 mg/dL (ref 8.9–10.3)
CHLORIDE: 111 mmol/L (ref 101–111)
CO2: 18 mmol/L — AB (ref 22–32)
CREATININE: 0.82 mg/dL (ref 0.44–1.00)
GFR calc non Af Amer: >60 mL/min (ref >60)
Glucose, Bld: 111 mg/dL — ABNORMAL HIGH (ref 65–99)
Potassium: 4.2 mmol/L (ref 3.5–5.1)
Sodium: 136 mmol/L (ref 135–145)
Total Bilirubin: 0.8 mg/dL (ref 0.3–1.2)
Total Protein: 7.8 g/dL (ref 6.5–8.1)

## 2016-10-09 LAB — URINALYSIS, ROUTINE W REFLEX MICROSCOPIC
Bilirubin Urine: NEGATIVE
GLUCOSE, UA: NEGATIVE mg/dL
KETONES UR: NEGATIVE mg/dL
Nitrite: NEGATIVE
PH: 5 (ref 5.0–8.0)
PROTEIN: NEGATIVE mg/dL
SPECIFIC GRAVITY, URINE: 1.018 (ref 1.005–1.030)

## 2016-10-09 LAB — CBC
HCT: 41.9 % (ref 36.0–46.0)
Hemoglobin: 13.9 g/dL (ref 12.0–15.0)
MCH: 28.3 pg (ref 26.0–34.0)
MCHC: 33.2 g/dL (ref 30.0–36.0)
MCV: 85.2 fL (ref 78.0–100.0)
PLATELETS: 310 10*3/uL (ref 150–400)
RBC: 4.92 MIL/uL (ref 3.87–5.11)
RDW: 14.9 % (ref 11.5–15.5)
WBC: 9.1 10*3/uL (ref 4.0–10.5)

## 2016-10-09 LAB — I-STAT BETA HCG BLOOD, ED (MC, WL, AP ONLY)

## 2016-10-09 LAB — WET PREP, GENITAL
Clue Cells Wet Prep HPF POC: NONE SEEN
SPERM: NONE SEEN
YEAST WET PREP: NONE SEEN

## 2016-10-09 LAB — LIPASE, BLOOD: LIPASE: 28 U/L (ref 11–51)

## 2016-10-09 MED ORDER — HYDROCODONE-ACETAMINOPHEN 5-325 MG PO TABS: 2.0000 | ORAL_TABLET | ORAL | 0 refills | Status: DC | PRN

## 2016-10-09 MED ORDER — HYDROMORPHONE HCL 1 MG/ML IJ SOLN
0.5000 mg | Freq: Once | INTRAMUSCULAR | Status: DC
Start: 2016-10-09 — End: 2016-10-09
  Filled 2016-10-09: qty 1

## 2016-10-09 MED ORDER — METRONIDAZOLE 500 MG PO TABS
2000.0000 mg | ORAL_TABLET | Freq: Once | ORAL | Status: AC
  Administered 2016-10-09: 2000 mg via ORAL
  Filled 2016-10-09: qty 4

## 2016-10-09 MED ORDER — DOXYCYCLINE HYCLATE 100 MG PO CAPS: 100.0000 mg | ORAL_CAPSULE | Freq: Two times a day (BID) | ORAL | 0 refills | Status: DC

## 2016-10-09 MED ORDER — KETOROLAC TROMETHAMINE 30 MG/ML IJ SOLN
15.0000 mg | Freq: Once | INTRAMUSCULAR | Status: AC
  Administered 2016-10-09: 15 mg via INTRAVENOUS
  Filled 2016-10-09: qty 1

## 2016-10-09 MED ORDER — MORPHINE SULFATE (PF) 4 MG/ML IV SOLN
4.0000 mg | Freq: Once | INTRAVENOUS | Status: AC
Start: 2016-10-09 — End: 2016-10-09
  Administered 2016-10-09: 4 mg via INTRAVENOUS
  Filled 2016-10-09: qty 1

## 2016-10-09 MED ORDER — ONDANSETRON 4 MG PO TBDP
4.0000 mg | ORAL_TABLET | Freq: Once | ORAL | Status: AC | PRN
  Administered 2016-10-09: 4 mg via ORAL

## 2016-10-09 MED ORDER — ONDANSETRON 4 MG PO TBDP
ORAL_TABLET | ORAL | Status: AC
  Filled 2016-10-09: qty 1

## 2016-10-09 MED ORDER — METOCLOPRAMIDE HCL 5 MG/ML IJ SOLN
10.0000 mg | Freq: Once | INTRAMUSCULAR | Status: AC
  Administered 2016-10-09: 10 mg via INTRAVENOUS
  Filled 2016-10-09: qty 2

## 2016-10-09 MED ORDER — CEPHALEXIN 500 MG PO CAPS: 500.0000 mg | ORAL_CAPSULE | Freq: Three times a day (TID) | ORAL | 0 refills | Status: DC

## 2016-10-09 MED ORDER — DEXTROSE 5 % IV SOLN
1.0000 g | Freq: Once | INTRAVENOUS | Status: AC
  Administered 2016-10-09: 1 g via INTRAVENOUS
  Filled 2016-10-09: qty 10

## 2016-10-09 MED ORDER — ONDANSETRON 4 MG PO TBDP: 4.0000 mg | ORAL_TABLET | Freq: Three times a day (TID) | ORAL | 0 refills | Status: DC | PRN

## 2016-10-09 NOTE — ED Notes (Signed)
Patient refused additional vitals or assessments.  Patient pacing in hall stating she needs to leave for her daughter's ceremony.  Pt barely allowed this RN to go over papers, but prescriptions, and return precautions reviewed.  Patient escorted to exit.

## 2016-10-09 NOTE — Discharge Instructions (Signed)
Your US shows fibroids on your uterus. Your urine does appear infected.Will start you on keflex for 10 days. You also were positive for trichomonas. Have been treated for flagyl. Have also treated you for pelvic inflammatory please take the doxycycline for 14 days. Please follow up with your primary care doctor and your ob/gyn. Return to the Ed if your symptoms worsen.

## 2016-10-09 NOTE — ED Provider Notes (Signed)
[facility] DEPT Provider Note   CSN: [id number] Arrival date & time: 10/09/16  0904     History   Chief Complaint Chief Complaint  Patient presents with  . Abdominal Cramping    HPI Valerie Walter is a 36 y.o. female.  HPI 36 year old African-American female past medical history significant for ovarian cyst presents to the emergency Department today with complaints of low back pain, lower abdominal pain, urinary frequency, urinary urgency, nausea. Patient states that 3 days ago she started developing pain in her low back. She thought that she slept on her bed ROM. She got an extra bedtime for help with the support. States that she then started developing pain in her lower abdomen. She also notes urinary urgency and frequency with a foul-smelling urine. Patient denies any vaginal discharge or odor. States that she has not been sexually active for the past 6 months. Patient describes the pain as cramping in nature and constant. Unrelieved by home hydrocodone and ibuprofen. Patient denies any vaginal bleeding. Patient also states her last bowel was yesterday without any blood in it. States that she does get injections for birth control. Patient with history of ovarian cyst. Pain is worse with bending and moving. She reports some mild nausea but denies any emesis. Pt is not concerned for std.   Pt denies any fever, chill, ha, vision changes, lightheadedness, dizziness, congestion, neck pain, cp, sob, cough, v/d, urinary symptoms, change in bowel habits, melena, hematochezia, lower extremity paresthesias.    Past Medical History:  Diagnosis Date  . Miscarriage   . Ovarian cyst   . Seasonal allergies   . Shingles   . Thyroid disease   . Trichomonas   . Umbilical hernia     Patient Active Problem List   Diagnosis Date Noted  . Facial cellulitis 03/03/2016  . Hypokalemia 03/03/2016  . Trichomoniasis 03/03/2016  . Sepsis ([place]) 03/02/2016    Past Surgical History:  Procedure  Laterality Date  . APPENDECTOMY    . DILATION AND CURETTAGE OF UTERUS    . IRRIGATION AND DEBRIDEMENT ABSCESS      OB History    Gravida Para Term Preterm AB Living   5 2 2  0 3 2   SAB TAB Ectopic Multiple Live Births   2 1 0 0 2       Home Medications    Prior to Admission medications   Medication Sig Start Date End Date Taking? Authorizing Provider  acetaminophen (TYLENOL) 325 MG tablet Take 650 mg by mouth every 6 (six) hours as needed for mild pain.   Yes [provider]  albuterol (PROVENTIL HFA;VENTOLIN HFA) 108 (90 Base) MCG/ACT inhaler Inhale 1 puff into the lungs every 6 (six) hours as needed for wheezing or shortness of breath.   Yes [provider]  ibuprofen (ADVIL,MOTRIN) 600 MG tablet Take 1 tablet (600 mg total) by mouth every 6 (six) hours as needed. Patient taking differently: Take 600 mg by mouth every 6 (six) hours as needed for mild pain.  02/08/16  Yes Leftwich-Kirby, Kathie Dike, CNM  medroxyPROGESTERone (DEPO-PROVERA) 150 MG/ML injection Inject 150 mg into the muscle every 3 (three) months. Pt is having next dose on 02/16/16   Yes [provider]  acyclovir (ZOVIRAX) 800 MG tablet Take 1 tablet (800 mg total) by mouth 5 (five) times daily. Patient not taking: Reported on 03/02/2016 02/25/16   Smiley Houseman, MD  doxycycline (VIBRAMYCIN) 100 MG capsule Take 1 capsule (100 mg total) by mouth  2 (two) times daily. Patient not taking: Reported on 10/09/2016 05/24/16   Lysbeth Penner, FNP  hydrOXYzine (ATARAX/VISTARIL) 25 MG tablet Take 1 tablet (25 mg total) by mouth every 6 (six) hours. Patient not taking: Reported on 10/09/2016 05/24/16   Lysbeth Penner, FNP  methylPREDNISolone (MEDROL DOSEPAK) 4 MG TBPK tablet Take 6-5-4-3-2-1 po qd Patient not taking: Reported on 10/09/2016 05/24/16   Lysbeth Penner, FNP  predniSONE (DELTASONE) 20 MG tablet Take 60mg  x 2 days, 40mg  x 2 days, 20mg  x 3 days Patient not taking: Reported on 03/02/2016  02/24/16   Smiley Houseman, MD  promethazine (PHENERGAN) 25 MG tablet Take 0.5-1 tablets (12.5-25 mg total) by mouth every 6 (six) hours as needed. Patient not taking: Reported on 02/22/2016 02/08/16   Fatima Blank A, CNM  triamcinolone cream (KENALOG) 0.1 % Apply 1 application topically 2 (two) times daily. Patient not taking: Reported on 10/09/2016 04/11/16   Barnet Glasgow, NP    Family History Family History  Problem Relation Age of Onset  . Cancer Mother     Social History Social History  Substance Use Topics  . Smoking status: Current Every Day Smoker    Packs/day: 1.00    Types: Cigarettes  . Smokeless tobacco: Never Used  . Alcohol use Yes     Comment: occasional     Allergies   Other   Review of Systems Review of Systems  Constitutional: Negative for chills and fever.  HENT: Negative for congestion.   Eyes: Negative for visual disturbance.  Respiratory: Negative for cough and shortness of breath.   Cardiovascular: Negative for chest pain.  Gastrointestinal: Positive for abdominal pain and nausea. Negative for diarrhea and vomiting.  Genitourinary: Positive for flank pain, frequency and urgency. Negative for dysuria, hematuria, vaginal bleeding and vaginal discharge.  Musculoskeletal: Positive for back pain. Negative for arthralgias and myalgias.  Skin: Negative for rash.  Neurological: Negative for dizziness, syncope, weakness, light-headedness, numbness and headaches.  Psychiatric/Behavioral: Negative for sleep disturbance. The patient is not nervous/anxious.      Physical Exam Updated Vital Signs BP 118/82   Pulse 65   Temp 98.4 F (36.9 C) (Oral)   Resp 18   Ht 5\' 3"  (1.6 m)   Wt 83.9 kg (185 lb)   SpO2 93%   BMI 32.77 kg/m   Physical Exam  Constitutional: She is oriented to person, place, and time. She appears well-developed and well-nourished.  Non-toxic appearance. No distress.  HENT:  Head: Normocephalic and atraumatic.  Nose:  Nose normal.  Mouth/Throat: Oropharynx is clear and moist.  Eyes: Pupils are equal, round, and reactive to light. Conjunctivae are normal. Right eye exhibits no discharge. Left eye exhibits no discharge.  Neck: Normal range of motion. Neck supple.  Cardiovascular: Normal rate, regular rhythm, normal heart sounds and intact distal pulses.  Exam reveals no gallop and no friction rub.   No murmur heard. Pulmonary/Chest: Effort normal and breath sounds normal. No respiratory distress. She exhibits no tenderness.  Abdominal: Soft. Bowel sounds are normal. She exhibits no mass. There is tenderness in the right lower quadrant, suprapubic area and left lower quadrant. There is CVA tenderness. There is no rigidity, no rebound (bilateral), no guarding, no tenderness at McBurney's point and negative Murphy's sign.  Genitourinary:  Genitourinary Comments: Chaperone present for exam. No external lesions, swelling, erythema, or rash of the labia. No erythema, , bleeding, or lesions noted in the vaginal vault. Green discharge noted with odor. CMT tenderness. No  cervical leeding or friability. Bilateral adnexal tenderness. No mass or fullness bilaterally. No inguinal adenopathy or hernia.    Musculoskeletal: Normal range of motion. She exhibits no tenderness.  Lymphadenopathy:    She has no cervical adenopathy.  Neurological: She is alert and oriented to person, place, and time.  Skin: Skin is warm and dry. Capillary refill takes less than 2 seconds.  Psychiatric: Her behavior is normal. Judgment and thought content normal.  Nursing note and vitals reviewed.    ED Treatments / Results  Labs (all labs ordered are listed, but only abnormal results are displayed) Labs Reviewed  COMPREHENSIVE METABOLIC PANEL - Abnormal; Notable for the following:       Result Value   CO2 18 (*)    Glucose, Bld 111 (*)    All other components within normal limits  URINALYSIS, ROUTINE W REFLEX MICROSCOPIC - Abnormal; Notable  for the following:    APPearance CLOUDY (*)    Hgb urine dipstick SMALL (*)    Leukocytes, UA LARGE (*)    Bacteria, UA RARE (*)    Squamous Epithelial / LPF 6-30 (*)    All other components within normal limits  WET PREP, GENITAL  URINE CULTURE  LIPASE, BLOOD  CBC  I-STAT BETA HCG BLOOD, ED ([facility], [facility], [facility] ONLY)  GC/CHLAMYDIA PROBE AMP ([facility]) NOT AT [facility]    EKG  EKG Interpretation None       Radiology US Transvaginal Non-ob  Result Date: 10/09/2016 CLINICAL DATA:  Pelvic pain for 3 days. EXAM: TRANSABDOMINAL AND TRANSVAGINAL ULTRASOUND OF PELVIS DOPPLER ULTRASOUND OF OVARIES TECHNIQUE: Both transabdominal and transvaginal ultrasound examinations of the pelvis were performed. Transabdominal technique was performed for global imaging of the pelvis including uterus, ovaries, adnexal regions, and pelvic cul-de-sac. It was necessary to proceed with endovaginal exam following the transabdominal exam to visualize the endometrium and ovaries. Color and duplex Doppler ultrasound was utilized to evaluate blood flow to the ovaries. COMPARISON:  08/08/2012 FINDINGS: Uterus Measurements: 9 x 6 x 6 cm. There are 4 hypoechoic heterogeneous masses consistent with fibroids. These are distributed along the fundus and anterior uterine body, measuring between 12 mm and 32 mm. A 13 mm fibroid is along the endometrium, without visible distortion. The others are intramural/subserosal. Endometrium Diffusely difficult to visualize, at least partially from body habitus. No focal abnormality is seen. No visible sub endometrial cysts. There is no reported history of abnormal pelvic bleeding. Right ovary Measurements: 35 x 14 x 30 mm. Normal appearance/no adnexal mass. Left ovary Measurements: 25 x 21 x 19 mm. Normal appearance/no adnexal mass. Pulsed Doppler evaluation of both ovaries demonstrates normal low-resistance arterial and venous waveforms. Other findings No abnormal free fluid. IMPRESSION: 1. No acute  finding.  Normal bilateral ovarian blood flow. 2. Fibroid uterus, individually measuring up to 3.6 cm. The largest fibroids are intramural. 3. The endometrium is diffusely difficult to visualize. Electronically Signed   By: Monte Fantasia M.D.   On: 10/09/2016 13:08   US Pelvis Complete  Result Date: 10/09/2016 CLINICAL DATA:  Pelvic pain for 3 days. EXAM: TRANSABDOMINAL AND TRANSVAGINAL ULTRASOUND OF PELVIS DOPPLER ULTRASOUND OF OVARIES TECHNIQUE: Both transabdominal and transvaginal ultrasound examinations of the pelvis were performed. Transabdominal technique was performed for global imaging of the pelvis including uterus, ovaries, adnexal regions, and pelvic cul-de-sac. It was necessary to proceed with endovaginal exam following the transabdominal exam to visualize the endometrium and ovaries. Color and duplex Doppler ultrasound was utilized to evaluate blood flow to  the ovaries. COMPARISON:  08/08/2012 FINDINGS: Uterus Measurements: 9 x 6 x 6 cm. There are 4 hypoechoic heterogeneous masses consistent with fibroids. These are distributed along the fundus and anterior uterine body, measuring between 12 mm and 32 mm. A 13 mm fibroid is along the endometrium, without visible distortion. The others are intramural/subserosal. Endometrium Diffusely difficult to visualize, at least partially from body habitus. No focal abnormality is seen. No visible sub endometrial cysts. There is no reported history of abnormal pelvic bleeding. Right ovary Measurements: 35 x 14 x 30 mm. Normal appearance/no adnexal mass. Left ovary Measurements: 25 x 21 x 19 mm. Normal appearance/no adnexal mass. Pulsed Doppler evaluation of both ovaries demonstrates normal low-resistance arterial and venous waveforms. Other findings No abnormal free fluid. IMPRESSION: 1. No acute finding.  Normal bilateral ovarian blood flow. 2. Fibroid uterus, individually measuring up to 3.6 cm. The largest fibroids are intramural. 3. The endometrium is  diffusely difficult to visualize. Electronically Signed   By: Monte Fantasia M.D.   On: 10/09/2016 13:08   [place] Art/ven Flow Abd Pelv Doppler  Result Date: 10/09/2016 CLINICAL DATA:  Pelvic pain for 3 days. EXAM: TRANSABDOMINAL AND TRANSVAGINAL ULTRASOUND OF PELVIS DOPPLER ULTRASOUND OF OVARIES TECHNIQUE: Both transabdominal and transvaginal ultrasound examinations of the pelvis were performed. Transabdominal technique was performed for global imaging of the pelvis including uterus, ovaries, adnexal regions, and pelvic cul-de-sac. It was necessary to proceed with endovaginal exam following the transabdominal exam to visualize the endometrium and ovaries. Color and duplex Doppler ultrasound was utilized to evaluate blood flow to the ovaries. COMPARISON:  08/08/2012 FINDINGS: Uterus Measurements: 9 x 6 x 6 cm. There are 4 hypoechoic heterogeneous masses consistent with fibroids. These are distributed along the fundus and anterior uterine body, measuring between 12 mm and 32 mm. A 13 mm fibroid is along the endometrium, without visible distortion. The others are intramural/subserosal. Endometrium Diffusely difficult to visualize, at least partially from body habitus. No focal abnormality is seen. No visible sub endometrial cysts. There is no reported history of abnormal pelvic bleeding. Right ovary Measurements: 35 x 14 x 30 mm. Normal appearance/no adnexal mass. Left ovary Measurements: 25 x 21 x 19 mm. Normal appearance/no adnexal mass. Pulsed Doppler evaluation of both ovaries demonstrates normal low-resistance arterial and venous waveforms. Other findings No abnormal free fluid. IMPRESSION: 1. No acute finding.  Normal bilateral ovarian blood flow. 2. Fibroid uterus, individually measuring up to 3.6 cm. The largest fibroids are intramural. 3. The endometrium is diffusely difficult to visualize. Electronically Signed   By: Monte Fantasia M.D.   On: 10/09/2016 13:08    Procedures Procedures (including  critical care time)  Medications Ordered in ED Medications  ondansetron (ZOFRAN-ODT) 4 MG disintegrating tablet (not administered)  metoCLOPramide (REGLAN) injection 10 mg (not administered)  morphine 4 MG/ML injection 4 mg (not administered)  ondansetron (ZOFRAN-ODT) disintegrating tablet 4 mg (4 mg Oral Given 10/09/16 0931)     Initial Impression / Assessment and Plan / ED Course  I have reviewed the triage vital signs and the nursing notes.  Pertinent labs & imaging results that were available during my care of the patient were reviewed by me and considered in my medical decision making (see chart for details).     Patient presents to the emergency Department today with complaints of low back pain, abdominal cramping. Patient denies any associated complaints of vaginal bleeding, vaginal discharge, fevers, emesis, urinary symptoms.  On exam patient seems to be in discomfort due to  pain. Vital signs are reassuring. Patient is nontoxic-appearing. She is afebrile, no tachycardia, no hypotension.  Patient does have some suprapubic abdominal tenderness and bilateral CVA tenderness. Pelvic exam reveals adnexal tenderness, cervical motion tenderness, green discharge.  Lab work reveals no leukocytosis. UA consistent with UTI. Given patient's low back pain possible pyelonephritis. Wet prep reveals Trichomonas and WBCs. The patient states that she is not sexually active however she does use sex toys. History of trichomonas in the past. We'll treat her 1 dose of Flagyl. Patient does have history of ovarian cyst. We'll obtain ultrasound to rule out any ovarian torsion. The patient has a history of appendectomy without any focal right lower quadrant tenderness or left lower quadrant tenderness. Do not feel that CT scan is indicated at this time. Low suspicion for diverticulitis, cholecystitis, ectopic.  Ultrasound reveals uterine fibroids unremarkable ovaries. Given patient's discomfort with the pelvic  exam in presence of trichomonas. We'll cover patient for PID, Trichomonas, UTI. Discussed with pharmacy. We'll give dose of Rocephin in the ED. Patient given 1 dose of Flagyl to cover for Trichomonas. Will be discharged home on doxycycline for 14 days to treat PID. We'll also be sent home with Keflex, for UTI first pyelonephritis.  Pain has been controlled in the ED. Patient is able to tolerate food without difficulties. Has normal gait on ambulation. Patient was requested to be discharged as she needs to go see her daughter at school. She states that she feels improved. Discussed strict return precautions with patient.  Pt is hemodynamically stable, in NAD, & able to ambulate in the ED. Evaluation does not show pathology that would require ongoing emergent intervention or inpatient treatment. I explained the diagnosis to the patient. Pain has been managed & has no complaints prior to dc. Pt is comfortable with above plan and is stable for discharge at this time. All questions were answered prior to disposition. Strict return precautions for f/u to the ED were discussed. Encouraged follow up with PCP.   Final Clinical Impressions(s) / ED Diagnoses   Final diagnoses:  Lower abdominal pain  Acute midline low back pain without sciatica  Nausea  Trichomonosis  Acute cystitis with hematuria    New Prescriptions Discharge Medication List as of 10/09/2016  3:27 PM    START taking these medications   Details  cephALEXin (KEFLEX) 500 MG capsule Take 1 capsule (500 mg total) by mouth 3 (three) times daily., Starting Wed 10/09/2016, Print    HYDROcodone-acetaminophen (NORCO/VICODIN) 5-325 MG tablet Take 2 tablets by mouth every 4 (four) hours as needed., Starting Wed 10/09/2016, Print    ondansetron (ZOFRAN-ODT) 4 MG disintegrating tablet Take 1 tablet (4 mg total) by mouth every 8 (eight) hours as needed for nausea., Starting Wed 10/09/2016, Print         Doristine Devoid, PA-C 10/10/16 4503     Fredia Sorrow, MD 10/16/16 [phone]

## 2016-10-09 NOTE — ED Notes (Signed)
Patient up to restroom.  Gait steady and even.

## 2016-10-09 NOTE — ED Triage Notes (Signed)
Pt presents with 3 days of progressive lower abdominal cramping. Pt denies n/v/d dysuria, blood in urine, vaginal bleeding or discharge. Pt sts last intercourse was in February 2018. Pt sts last bm was 1-2 days ago. Pt sts gets injectable birthcontrol for a year and has not had this issue. Pt in NAD. Has a history of ovarian cysts- pt sts lower back is in pain as well. Pt sts pain is worse with bending- pt sts tried tylenol and ibuprofen with no relief of symptoms. Pt took one hydrocodone from daughter- that made patient drowsy but didn't help with pain.

## 2016-10-09 NOTE — ED Notes (Signed)
Pt sts pain is worsening now and she is feeling nauseated.

## 2016-10-10 LAB — GC/CHLAMYDIA PROBE AMP (~~LOC~~) NOT AT ARMC
Chlamydia: NEGATIVE
Neisseria Gonorrhea: NEGATIVE

## 2016-10-10 LAB — URINE CULTURE

## 2017-01-15 ENCOUNTER — Encounter (HOSPITAL_COMMUNITY): Payer: Self-pay | Admitting: Emergency Medicine

## 2017-01-15 ENCOUNTER — Ambulatory Visit (HOSPITAL_COMMUNITY)
Admission: EM | Admit: 2017-01-15 | Discharge: 2017-01-15 | Disposition: A | Discharge status: Home / Self Care | Payer: Medicaid Other | Attending: Family Medicine | Admitting: Family Medicine

## 2017-01-15 DIAGNOSIS — J00 Acute nasopharyngitis [common cold]: Secondary | ICD-10-CM

## 2017-01-15 DIAGNOSIS — J029 Acute pharyngitis, unspecified: Secondary | ICD-10-CM | POA: Diagnosis not present

## 2017-01-15 LAB — POCT RAPID STREP A: STREPTOCOCCUS, GROUP A SCREEN (DIRECT): NEGATIVE

## 2017-01-15 MED ORDER — CROMOLYN SODIUM 5.2 MG/ACT NA AERS: 1.0000 | INHALATION_SPRAY | Freq: Four times a day (QID) | NASAL | 12 refills | Status: DC

## 2017-01-15 NOTE — ED Triage Notes (Signed)
Pt sts sore throat and URI sx x 2 days

## 2017-01-15 NOTE — ED Provider Notes (Signed)
[place]    CSN: [id number] Arrival date & time: 01/15/17  1532     History   Chief Complaint Chief Complaint  Patient presents with  . Sore Throat    HPI Valerie Walter is a 36 y.o. female.   Valerie Walter presents with complaints of sore throat, headache, congestion, fatigue and body aches which started two days ago. Sore throat has mildly improved. Has not taken any medications today for symptoms. No known fevers. Denies gi/gu complaints, urinary symptoms, rash, known ill contacts. Decreased appetite.    ROS per HPI.       Past Medical History:  Diagnosis Date  . Miscarriage   . Ovarian cyst   . Seasonal allergies   . Shingles   . Thyroid disease   . Trichomonas   . Umbilical hernia     Patient Active Problem List   Diagnosis Date Noted  . Facial cellulitis 03/03/2016  . Hypokalemia 03/03/2016  . Trichomoniasis 03/03/2016  . Sepsis ([place]) 03/02/2016    Past Surgical History:  Procedure Laterality Date  . APPENDECTOMY    . DILATION AND CURETTAGE OF UTERUS    . IRRIGATION AND DEBRIDEMENT ABSCESS      OB History    Gravida Para Term Preterm AB Living   5 2 2  0 3 2   SAB TAB Ectopic Multiple Live Births   2 1 0 0 2       Home Medications    Prior to Admission medications   Medication Sig Start Date End Date Taking? Authorizing Provider  acetaminophen (TYLENOL) 325 MG tablet Take 650 mg by mouth every 6 (six) hours as needed for mild pain.    [provider]  albuterol (PROVENTIL HFA;VENTOLIN HFA) 108 (90 Base) MCG/ACT inhaler Inhale 1 puff into the lungs every 6 (six) hours as needed for wheezing or shortness of breath.    [provider]  cephALEXin (KEFLEX) 500 MG capsule Take 1 capsule (500 mg total) by mouth 3 (three) times daily. 10/09/16   Doristine Devoid, PA-C  cromolyn (NASALCROM) 5.2 MG/ACT nasal spray Place 1 spray into both nostrils 4 (four) times daily. 01/15/17   Zigmund Gottron, NP  doxycycline  (VIBRAMYCIN) 100 MG capsule Take 1 capsule (100 mg total) by mouth 2 (two) times daily. 10/09/16   Doristine Devoid, PA-C  HYDROcodone-acetaminophen (NORCO/VICODIN) 5-325 MG tablet Take 2 tablets by mouth every 4 (four) hours as needed. 10/09/16   Doristine Devoid, PA-C  ibuprofen (ADVIL,MOTRIN) 600 MG tablet Take 1 tablet (600 mg total) by mouth every 6 (six) hours as needed. Patient taking differently: Take 600 mg by mouth every 6 (six) hours as needed for mild pain.  02/08/16   Leftwich-Kirby, Kathie Dike, CNM  medroxyPROGESTERone (DEPO-PROVERA) 150 MG/ML injection Inject 150 mg into the muscle every 3 (three) months. Pt is having next dose on 02/16/16    [provider]  ondansetron (ZOFRAN-ODT) 4 MG disintegrating tablet Take 1 tablet (4 mg total) by mouth every 8 (eight) hours as needed for nausea. 10/09/16   Doristine Devoid, PA-C    Family History Family History  Problem Relation Age of Onset  . Cancer Mother     Social History Social History   Tobacco Use  . Smoking status: Current Every Day Smoker    Packs/day: 1.00    Types: Cigarettes  . Smokeless tobacco: Never Used  Substance Use Topics  . Alcohol use: Yes    Comment: occasional  .  Drug use: No     Allergies   Other   Review of Systems Review of Systems   Physical Exam Triage Vital Signs ED Triage Vitals [01/15/17 1547]  Enc Vitals Group     BP 120/77     Pulse Rate 69     Resp 18     Temp 99.1 F (37.3 C)     Temp Source Oral     SpO2 100 %     Weight      Height      Head Circumference      Peak Flow      Pain Score 7     Pain Loc      Pain Edu?      Excl. in [place]?    No data found.  Updated Vital Signs BP 120/77 (BP Location: Left Arm)   Pulse 69   Temp 99.1 F (37.3 C) (Oral)   Resp 18   SpO2 100%   Visual Acuity Right Eye Distance:   Left Eye Distance:   Bilateral Distance:    Right Eye Near:   Left Eye Near:    Bilateral Near:     Physical Exam  Constitutional: She  is oriented to person, place, and time. She appears well-developed and well-nourished. No distress.  HENT:  Head: Normocephalic and atraumatic.  Right Ear: Tympanic membrane, external ear and ear canal normal.  Left Ear: Tympanic membrane, external ear and ear canal normal.  Nose: Mucosal edema and rhinorrhea present.  Mouth/Throat: Uvula is midline, oropharynx is clear and moist and mucous membranes are normal. No tonsillar exudate.  Eyes: Conjunctivae and EOM are normal. Pupils are equal, round, and reactive to light.  Cardiovascular: Normal rate, regular rhythm and normal heart sounds.  Pulmonary/Chest: Effort normal and breath sounds normal.  Neurological: She is alert and oriented to person, place, and time.  Skin: Skin is warm and dry.  Vitals reviewed.    UC Treatments / Results  Labs (all labs ordered are listed, but only abnormal results are displayed) Labs Reviewed  CULTURE, GROUP A STREP [facility])  POCT RAPID STREP A    EKG  EKG Interpretation None       Radiology No results found.  Procedures Procedures (including critical care time)  Medications Ordered in UC Medications - No data to display   Initial Impression / Assessment and Plan / UC Course  I have reviewed the triage vital signs and the nursing notes.  Pertinent labs & imaging results that were available during my care of the patient were reviewed by me and considered in my medical decision making (see chart for details).     Negative strep. History and physical consistent with viral illness. Supportive cares recommended. Nasal spray, decongestants or mucinex, ibuprofen. Push fluids to ensure adequate hydration and keep secretions thin.  If symptoms worsen or do not improve in the next week to return to be seen or to follow up with PCP.  Patient verbalized understanding and agreeable to plan.    Final Clinical Impressions(s) / UC Diagnoses   Final diagnoses:  Nasopharyngitis    ED Discharge  Orders        Ordered    cromolyn (NASALCROM) 5.2 MG/ACT nasal spray  4 times daily     01/15/17 1647       Controlled Substance Prescriptions [place] Controlled Substance Registry consulted? Not Applicable   Zigmund Gottron, NP 01/15/17 1705

## 2017-01-15 NOTE — Discharge Instructions (Signed)
Push fluids to ensure adequate hydration and keep secretions thin.  Tylenol and/or ibuprofen as needed for pain or fevers.  Nasal spray 4 times a day. Mucinex or decongestant may help with secretions.  If symptoms worsen or do not improve in the next week to return to be seen or to follow up with PCP.

## 2017-01-18 LAB — CULTURE, GROUP A STREP (THRC)

## 2017-02-17 ENCOUNTER — Emergency Department (HOSPITAL_COMMUNITY)
Admission: EM | Admit: 2017-02-17 | Discharge: 2017-02-17 | Discharge status: Against Medical Advice | Payer: Medicaid Other | Attending: Emergency Medicine | Admitting: Emergency Medicine

## 2017-02-17 ENCOUNTER — Emergency Department (HOSPITAL_BASED_OUTPATIENT_CLINIC_OR_DEPARTMENT_OTHER)
Admission: EM | Admit: 2017-02-17 | Discharge: 2017-02-17 | Disposition: A | Discharge status: Home / Self Care | Payer: Medicaid Other | Source: Home / Self Care | Attending: Emergency Medicine | Admitting: Emergency Medicine

## 2017-02-17 ENCOUNTER — Encounter (HOSPITAL_BASED_OUTPATIENT_CLINIC_OR_DEPARTMENT_OTHER): Payer: Self-pay

## 2017-02-17 ENCOUNTER — Encounter (HOSPITAL_COMMUNITY): Payer: Self-pay | Admitting: Emergency Medicine

## 2017-02-17 ENCOUNTER — Emergency Department (HOSPITAL_BASED_OUTPATIENT_CLINIC_OR_DEPARTMENT_OTHER): Payer: Medicaid Other

## 2017-02-17 ENCOUNTER — Other Ambulatory Visit: Payer: Self-pay

## 2017-02-17 DIAGNOSIS — S39012A Strain of muscle, fascia and tendon of lower back, initial encounter: Secondary | ICD-10-CM

## 2017-02-17 DIAGNOSIS — Z5321 Procedure and treatment not carried out due to patient leaving prior to being seen by health care provider: Secondary | ICD-10-CM | POA: Diagnosis not present

## 2017-02-17 DIAGNOSIS — M545 Low back pain, unspecified: Secondary | ICD-10-CM

## 2017-02-17 LAB — URINALYSIS, ROUTINE W REFLEX MICROSCOPIC
BILIRUBIN URINE: NEGATIVE
GLUCOSE, UA: NEGATIVE mg/dL
HGB URINE DIPSTICK: NEGATIVE
KETONES UR: 5 mg/dL — AB
LEUKOCYTES UA: NEGATIVE
Nitrite: NEGATIVE
PH: 5 (ref 5.0–8.0)
PROTEIN: NEGATIVE mg/dL
Specific Gravity, Urine: 1.026 (ref 1.005–1.030)

## 2017-02-17 LAB — POC URINE PREG, ED: Preg Test, Ur: NEGATIVE

## 2017-02-17 MED ORDER — METHOCARBAMOL 500 MG PO TABS: 500.0000 mg | ORAL_TABLET | Freq: Two times a day (BID) | ORAL | 0 refills | Status: DC

## 2017-02-17 MED ORDER — KETOROLAC TROMETHAMINE 60 MG/2ML IM SOLN
60.0000 mg | Freq: Once | INTRAMUSCULAR | Status: AC
  Administered 2017-02-17: 60 mg via INTRAMUSCULAR
  Filled 2017-02-17: qty 2

## 2017-02-17 NOTE — Discharge Instructions (Signed)
You can take Tylenol or Ibuprofen as directed for pain. You can alternate Tylenol and Ibuprofen every 4 hours. If you take Tylenol at 1pm, then you can take Ibuprofen at 5pm. Then you can take Tylenol again at 9pm.   Apply heat to the affected area.  Take Robaxin as prescribed. This medication will make you drowsy so do not drive or drink alcohol when taking it.  As we discussed, you can also buy lidocaine patches at the drugstore to help with the pain.  Follow-up with your primary care doctor in the next 24-48 hours for further evaluation.   Return to the Emergency Department immediately for any worsening back pain, neck pain, difficulty walking, numbness/weaknss of your arms or legs, urinary or bowel accidents, fever or any other worsening or concerning symptoms.

## 2017-02-17 NOTE — ED Triage Notes (Signed)
C/o lower back pain x today-denies injury-LWBS from [facility] ED today-NAD-steady gait

## 2017-02-17 NOTE — ED Provider Notes (Signed)
[place] EMERGENCY DEPARTMENT Provider Note   CSN: [id number] Arrival date & time: 02/17/17  1552     History   Chief Complaint Chief Complaint  Patient presents with  . Back Pain    HPI Valerie Walter is a 37 y.o. female resents for evaluate today.  Patient reports that over the last 24 hours, pain has significantly worsened.  Patient took 1 dose of ibuprofen last night with minimal improvement in pain.  She has not taken anything else.  Patient does not recall specific injury.  She states that the pain is worsened with movement of her back.  She states that she has been able to walk but reports that she has to be slower because of the pain.  Patient denies any urinary or bowel incontinence.  Patient reports that she did have an episode yesterday where she knew she had to go the bathroom but because the pain could not walk as fast and did not make it.  She denies any true incontinence. Denies fevers, weight loss, numbness/weakness of upper and lower extremities, bowel/bladder incontinence, saddle anesthesia, history of back surgery, history of IVDA.   The history is provided by the patient.    Past Medical History:  Diagnosis Date  . Miscarriage   . Ovarian cyst   . Seasonal allergies   . Shingles   . Thyroid disease   . Trichomonas   . Umbilical hernia     Patient Active Problem List   Diagnosis Date Noted  . Facial cellulitis 03/03/2016  . Hypokalemia 03/03/2016  . Trichomoniasis 03/03/2016  . Sepsis ([place]) 03/02/2016    Past Surgical History:  Procedure Laterality Date  . APPENDECTOMY    . DILATION AND CURETTAGE OF UTERUS    . IRRIGATION AND DEBRIDEMENT ABSCESS      OB History    Gravida Para Term Preterm AB Living   5 2 2  0 3 2   SAB TAB Ectopic Multiple Live Births   2 1 0 0 2       Home Medications    Prior to Admission medications   Medication Sig Start Date End Date Taking? Authorizing Provider  acetaminophen (TYLENOL) 325 MG tablet  Take 650 mg by mouth every 6 (six) hours as needed for mild pain.    [provider]  albuterol (PROVENTIL HFA;VENTOLIN HFA) 108 (90 Base) MCG/ACT inhaler Inhale 1 puff into the lungs every 6 (six) hours as needed for wheezing or shortness of breath.    [provider]  cephALEXin (KEFLEX) 500 MG capsule Take 1 capsule (500 mg total) by mouth 3 (three) times daily. 10/09/16   Doristine Devoid, PA-C  cromolyn (NASALCROM) 5.2 MG/ACT nasal spray Place 1 spray into both nostrils 4 (four) times daily. 01/15/17   Zigmund Gottron, NP  doxycycline (VIBRAMYCIN) 100 MG capsule Take 1 capsule (100 mg total) by mouth 2 (two) times daily. 10/09/16   Doristine Devoid, PA-C  HYDROcodone-acetaminophen (NORCO/VICODIN) 5-325 MG tablet Take 2 tablets by mouth every 4 (four) hours as needed. 10/09/16   Doristine Devoid, PA-C  ibuprofen (ADVIL,MOTRIN) 600 MG tablet Take 1 tablet (600 mg total) by mouth every 6 (six) hours as needed. Patient taking differently: Take 600 mg by mouth every 6 (six) hours as needed for mild pain.  02/08/16   Leftwich-Kirby, Kathie Dike, CNM  medroxyPROGESTERone (DEPO-PROVERA) 150 MG/ML injection Inject 150 mg into the muscle every 3 (three) months. Pt is having next dose on 02/16/16  [provider]  methocarbamol (ROBAXIN) 500 MG tablet Take 1 tablet (500 mg total) by mouth 2 (two) times daily. 02/17/17   [place] Lanius A, PA-C  ondansetron (ZOFRAN-ODT) 4 MG disintegrating tablet Take 1 tablet (4 mg total) by mouth every 8 (eight) hours as needed for nausea. 10/09/16   Doristine Devoid, PA-C    Family History Family History  Problem Relation Age of Onset  . Cancer Mother     Social History Social History   Tobacco Use  . Smoking status: Current Every Day Smoker    Packs/day: 1.00    Types: Cigarettes  . Smokeless tobacco: Never Used  Substance Use Topics  . Alcohol use: Yes    Comment: occasional  . Drug use: No     Allergies    Other   Review of Systems Review of Systems  Constitutional: Negative for fever.  Respiratory: Negative for shortness of breath.   Cardiovascular: Negative for chest pain.  Gastrointestinal: Negative for abdominal pain, nausea and vomiting.  Genitourinary: Negative for dysuria and hematuria.  Musculoskeletal: Positive for back pain. Negative for gait problem and neck pain.  Neurological: Negative for weakness, numbness and headaches.     Physical Exam Updated Vital Signs BP 118/82 (BP Location: Left Arm)   Pulse 64   Temp 98.5 F (36.9 C) (Oral)   Resp 18   SpO2 98%   Physical Exam  Constitutional: She is oriented to person, place, and time. She appears well-developed and well-nourished.  HENT:  Head: Normocephalic and atraumatic.  Mouth/Throat: Oropharynx is clear and moist and mucous membranes are normal.  Eyes: Conjunctivae, EOM and lids are normal. Pupils are equal, round, and reactive to light.  Neck: Full passive range of motion without pain.  Full flexion/extension and lateral movement of neck fully intact. No bony midline tenderness. No deformities or crepitus.   Cardiovascular: Normal rate, regular rhythm, normal heart sounds and normal pulses. Exam reveals no gallop and no friction rub.  No murmur heard. Pulmonary/Chest: Effort normal and breath sounds normal.  Abdominal: Soft. Normal appearance. There is no tenderness. There is no rigidity and no guarding.  Musculoskeletal: Normal range of motion.       Thoracic back: She exhibits no tenderness.       Back:  She is muscular tenderness overlying the bilateral paraspinal muscles of the lumbar region that noted.  Flexion/extension intact but with subjective reports of pain.   Neurological: She is alert and oriented to person, place, and time.  Follows commands, Moves all extremities  5/5 strength to BUE and BLE  Sensation intact throughout all major nerve distributions Normal gait  Skin: Skin is warm and dry.  Capillary refill takes less than 2 seconds.  Psychiatric: She has a normal mood and affect. Her speech is normal.  Nursing note and vitals reviewed.    ED Treatments / Results  Labs (all labs ordered are listed, but only abnormal results are displayed) Labs Reviewed - No data to display  EKG  EKG Interpretation None       Radiology Dg Lumbar Spine Complete  Result Date: 02/17/2017 CLINICAL DATA:  Lower back pain since yesterday. No prior or known injury. EXAM: LUMBAR SPINE - COMPLETE 4+ VIEW COMPARISON:  None. FINDINGS: There is no evidence of lumbar spine fracture. Alignment is normal. Intervertebral disc spaces are maintained. Cholecystectomy clips are seen in the right upper quadrant. IMPRESSION: No acute osseous abnormality of the lumbar spine. No pars defects or listhesis. Maintained disc  spaces. Electronically Signed   By: Ashley Royalty M.D.   On: 02/17/2017 18:11    Procedures Procedures (including critical care time)  Medications Ordered in ED Medications  ketorolac (TORADOL) injection 60 mg (60 mg Intramuscular Given 02/17/17 1804)     Initial Impression / Assessment and Plan / ED Course  I have reviewed the triage vital signs and the nursing notes.  Pertinent labs & imaging results that were available during my care of the patient were reviewed by me and considered in my medical decision making (see chart for details).     37 y.o. F who presents for back pain that began last night.  Does not recall specific injury.  Pain worse with movement.  Has been able to ambulate with no difficulty. Patient is afebrile, non-toxic appearing, sitting comfortably on examination table. Vital signs reviewed and stable.  No red flag symptoms.  No neurological deficits on exam.  History/physical exam not concerning for spinal abscess or cauda equina. Consider  musculoskeletal strain vs mechanical back pain.  Consider UTI or kidney stone. Plan to obtain XR imaging for further evaluation.  Analgesics provided in the department.  She was seen at [facility] long emergency department today prior to coming to the ED.  She left without being seen.  At that time, they did run a urine and urine pregnancy.  UA was unremarkable for any signs of hemoglobin or signs of infection.  Urine pregnancy was negative.  X-ray reviewed.  Negative for any acute fracture dislocation.  Discussed results with patient.  She reports improvement in pain after Toradol in the department.  Suspect this is musculoskeletal in nature.  Patient able to ambulate in the part without any difficulty.  Repeat vitals stable. RICE protocol and pain medicine indicated and discussed with patient. Instructed patient to follow-up with PCP in 2 days. Patient had ample opportunity for questions and discussion. All patient's questions were answered with full understanding. Strict return precautions discussed. Patient expresses understanding and agreement to plan.     Final Clinical Impressions(s) / ED Diagnoses   Final diagnoses:  Acute bilateral low back pain without sciatica  Strain of lumbar region, initial encounter    ED Discharge Orders        Ordered    methocarbamol (ROBAXIN) 500 MG tablet  2 times daily     02/17/17 1831       Desma Mcgregor 02/19/17 0231    Quintella Reichert, MD 02/19/17 0800

## 2017-02-17 NOTE — ED Notes (Signed)
Patient transported to X-ray 

## 2017-02-17 NOTE — ED Triage Notes (Signed)
Patient c/o lower back pain radiating into right lower quadrant with urinary frequency.

## 2017-04-21 IMAGING — CR DG CHEST 2V
2 series · 2 of 2 positions shown · non-contrast
Comparison: Thoracic spine radiographs 02/23/2008

CLINICAL DATA: Cough, shortness of breath, and mid chest pain
radiating to the back, worsening over the last few days. Smoker.

EXAM:
CHEST  2 VIEW

[w chest pa]
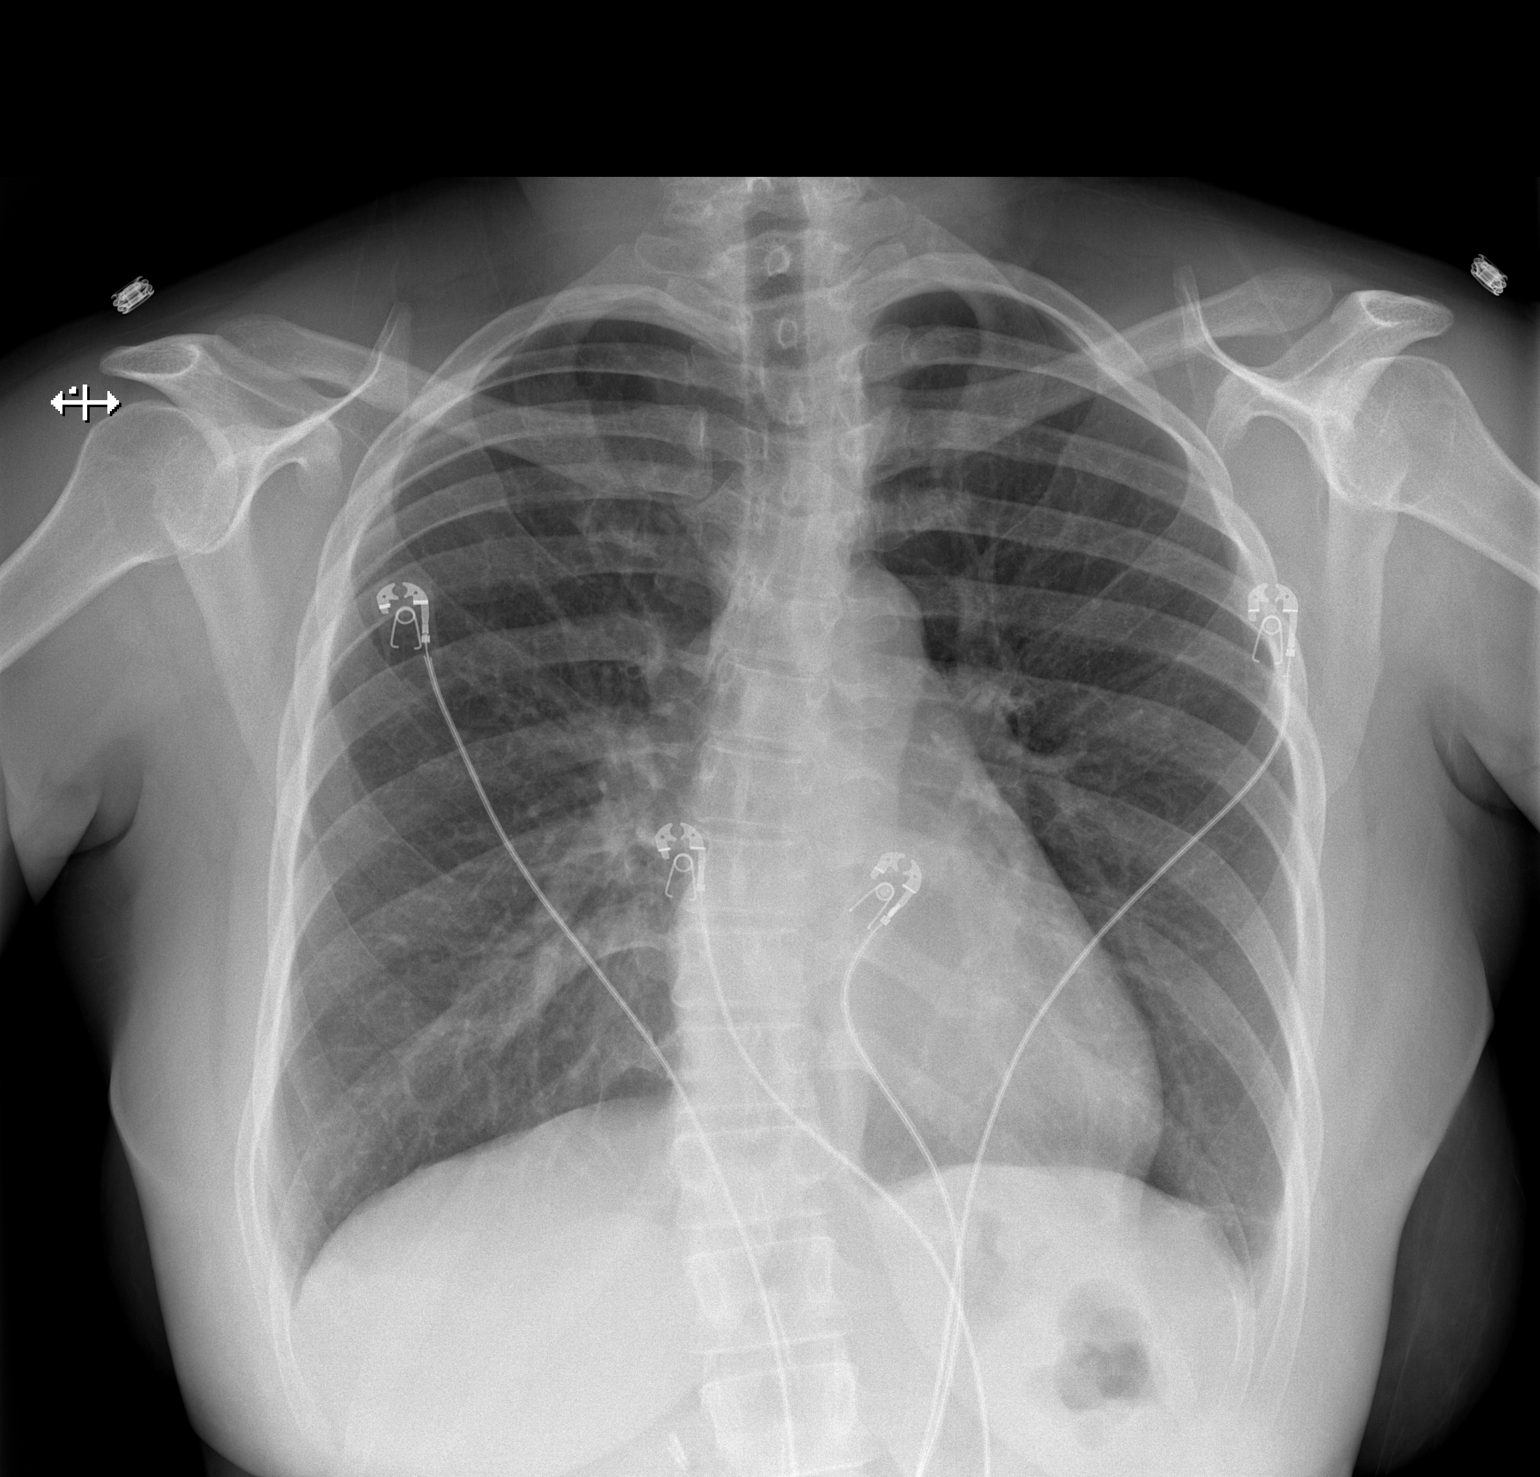

[w chest lat]
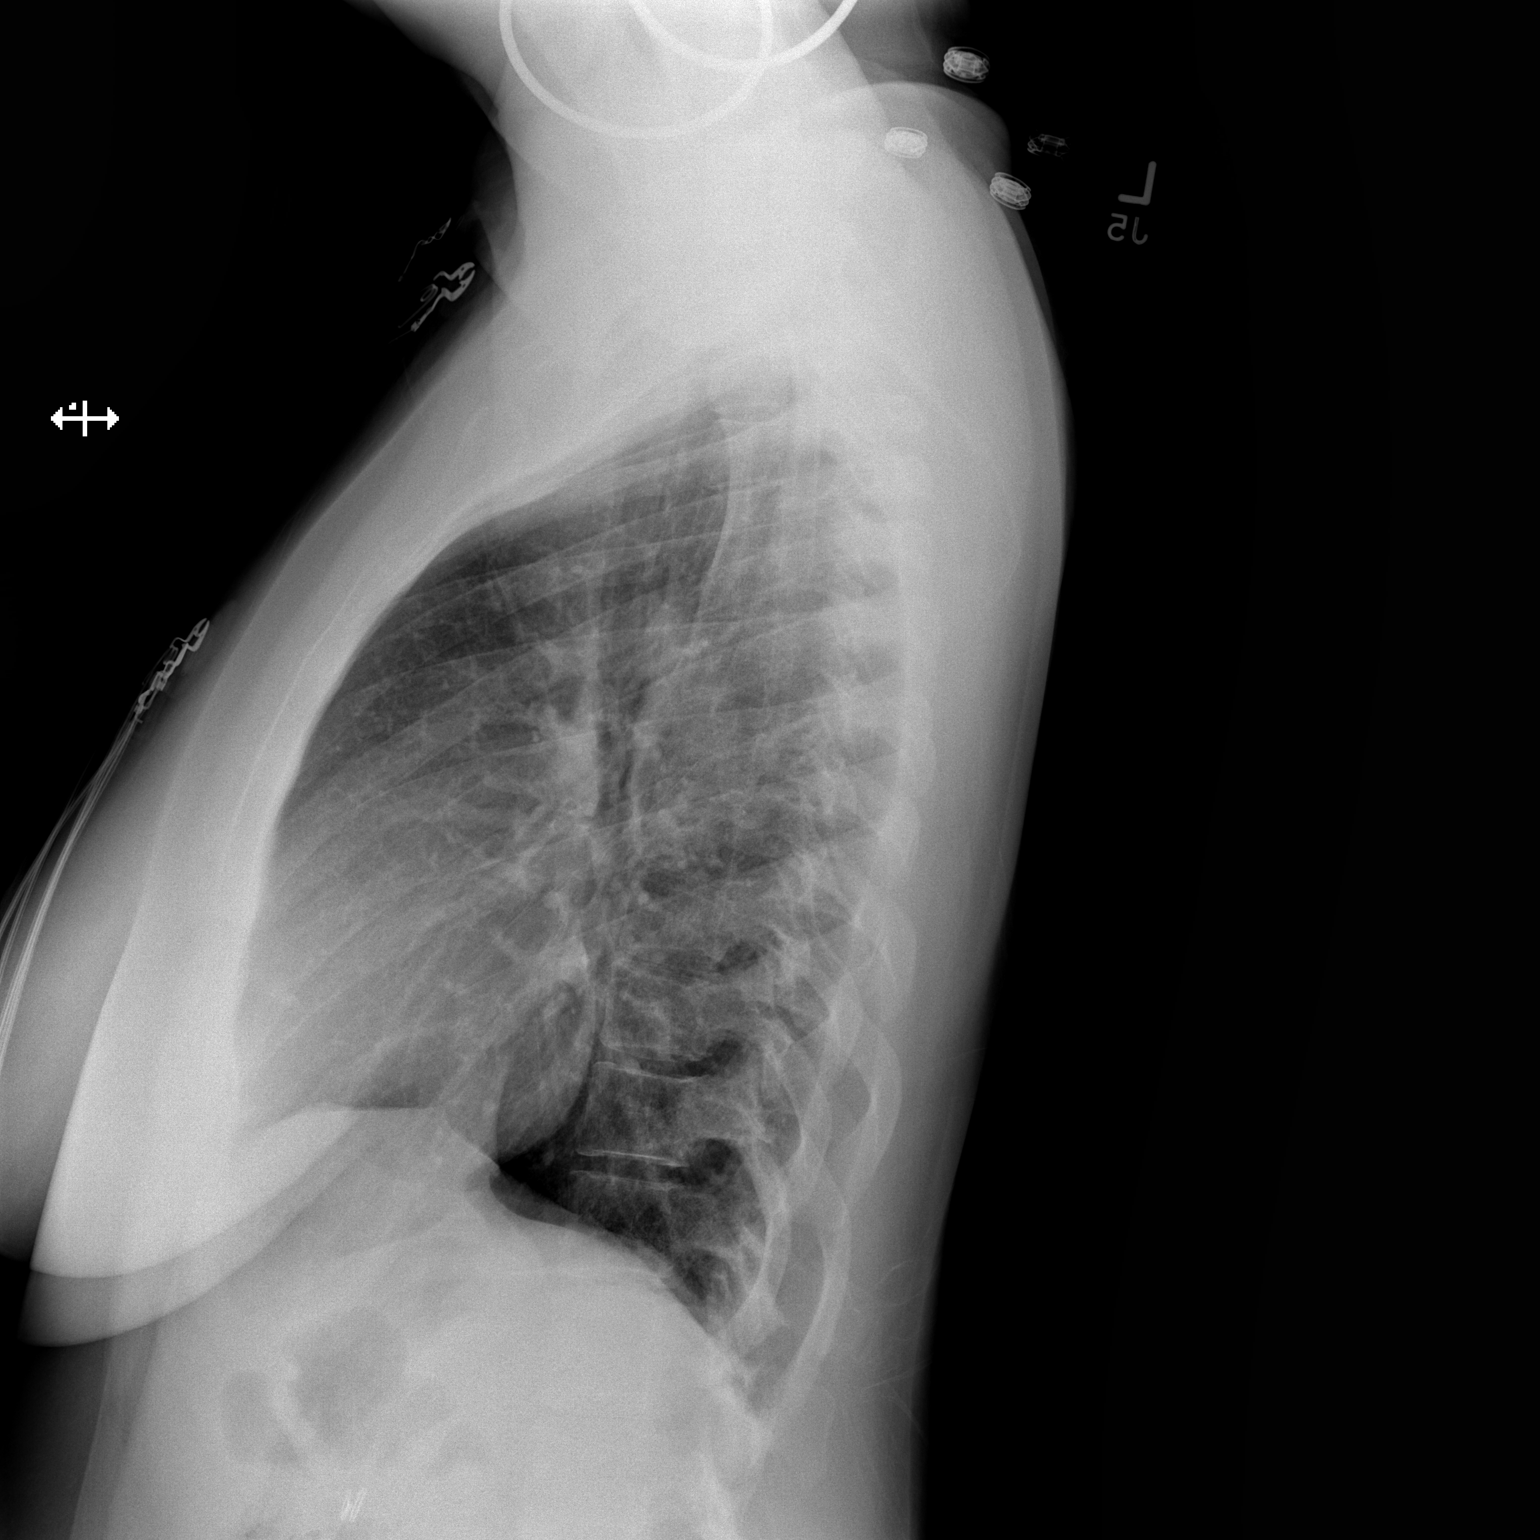

[2 of 2 positions shown; findings below may reference images not displayed]

FINDINGS: The cardiomediastinal silhouette is within normal limits. There is
slight central airway thickening. Minimal scarring or atelectasis is
noted in the left lung base. No confluent airspace opacity, edema,
pleural effusion, or pneumothorax is identified. Thoracic scoliosis
is again seen. Upper abdominal surgical clips are noted.
IMPRESSION: Slight bronchitic change.

## 2017-04-30 ENCOUNTER — Other Ambulatory Visit: Payer: Self-pay

## 2017-04-30 ENCOUNTER — Ambulatory Visit (HOSPITAL_COMMUNITY)
Admission: EM | Admit: 2017-04-30 | Discharge: 2017-04-30 | Disposition: A | Discharge status: Home / Self Care | Payer: Medicaid Other | Attending: Family Medicine | Admitting: Family Medicine

## 2017-04-30 ENCOUNTER — Encounter (HOSPITAL_COMMUNITY): Payer: Self-pay | Admitting: Emergency Medicine

## 2017-04-30 DIAGNOSIS — K529 Noninfective gastroenteritis and colitis, unspecified: Secondary | ICD-10-CM

## 2017-04-30 MED ORDER — ONDANSETRON 4 MG PO TBDP: 4.0000 mg | ORAL_TABLET | Freq: Three times a day (TID) | ORAL | 0 refills | Status: DC | PRN

## 2017-04-30 NOTE — ED Provider Notes (Signed)
[place]   [id number] 04/30/17 Arrival Time: 6387  ASSESSMENT & PLAN:  1. Gastroenteritis     Meds ordered this encounter  Medications  . ondansetron (ZOFRAN-ODT) 4 MG disintegrating tablet    Sig: Take 1 tablet (4 mg total) by mouth every 8 (eight) hours as needed for nausea or vomiting.    Dispense:  15 tablet    Refill:  0   Discussed typical duration of symptoms for suspected viral GI illness. Will do her best to ensure adequate fluid intake in order to avoid dehydration. Will proceed to the Emergency Department for evaluation if unable to tolerate PO fluids regularly.  Otherwise she will f/u with his PCP or here if not showing improvement over the next 48-72 hours.  Reviewed expectations re: course of current medical issues. Questions answered. Outlined signs and symptoms indicating need for more acute intervention. Patient verbalized understanding. After Visit Summary given.   SUBJECTIVE: History from: patient.  Valerie Walter is a 37 y.o. female who presents with complaint of non-bloody intermittent nausea and vomiting of brown material with diarrhea. Onset abrupt, yesterday. Abdominal discomfort: mild and cramping. Symptoms are stable since beginning. Aggravating factors: eating. Alleviating factors: none. Associated symptoms: fatigue. She denies fever. Appetite: decreased. PO intake: decreased. Ambulatory without assistance. Urinary symptoms: none. Last bowel movement today without blood. OTC treatment: none.  No LMP recorded. Patient has had an injection.  Past Surgical History:  Procedure Laterality Date  . APPENDECTOMY    . DILATION AND CURETTAGE OF UTERUS    . IRRIGATION AND DEBRIDEMENT ABSCESS      ROS: As per HPI.  OBJECTIVE:  Vitals:   04/30/17 1538  BP: (!) 141/92  Pulse: 70  Temp: 98.4 F (36.9 C)  TempSrc: Oral  SpO2: 98%    General appearance: alert; no distress Oropharynx: moist Lungs: clear to auscultation  bilaterally Heart: regular rate and rhythm Abdomen: soft; non-distended; no significant abdominal tenderness, "just a cramping feeling"; bowel sounds present; no masses or organomegaly; no guarding or rebound tenderness Back: no CVA tenderness Extremities: no edema; symmetrical with no gross deformities Skin: warm and dry Neurologic: normal gait Psychological: alert and cooperative; normal mood and affect  Allergies  Allergen Reactions  . Other Hives    All nuts                                               Past Medical History:  Diagnosis Date  . Miscarriage   . Ovarian cyst   . Seasonal allergies   . Shingles   . Thyroid disease   . Trichomonas   . Umbilical hernia    Social History   Socioeconomic History  . Marital status: Single    Spouse name: Not on file  . Number of children: Not on file  . Years of education: Not on file  . Highest education level: Not on file  Social Needs  . Financial resource strain: Not on file  . Food insecurity - worry: Not on file  . Food insecurity - inability: Not on file  . Transportation needs - medical: Not on file  . Transportation needs - non-medical: Not on file  Occupational History  . Not on file  Tobacco Use  . Smoking status: Current Every Day Smoker    Packs/day: 1.00    Types: Cigarettes  . Smokeless tobacco:  Never Used  Substance and Sexual Activity  . Alcohol use: Yes    Comment: occasional  . Drug use: No  . Sexual activity: Yes    Birth control/protection: Injection    Comment: depo 3 months ago  Other Topics Concern  . Not on file  Social History Narrative  . Not on file   Family History  Problem Relation Age of Onset  . Cancer Mother      Vanessa Kick, MD 05/05/17 [phone]

## 2017-04-30 NOTE — ED Triage Notes (Signed)
Pt reports generalized body aches, headache, nausea, and diarrhea x2 days.  No known fever at home.

## 2017-04-30 NOTE — Discharge Instructions (Signed)

## 2017-10-22 ENCOUNTER — Encounter (HOSPITAL_COMMUNITY): Payer: Self-pay | Admitting: Emergency Medicine

## 2017-10-22 ENCOUNTER — Ambulatory Visit (HOSPITAL_COMMUNITY)
Admission: EM | Admit: 2017-10-22 | Discharge: 2017-10-22 | Disposition: A | Discharge status: Home / Self Care | Payer: Medicaid Other | Attending: Family Medicine | Admitting: Family Medicine

## 2017-10-22 ENCOUNTER — Other Ambulatory Visit: Payer: Self-pay

## 2017-10-22 DIAGNOSIS — H61891 Other specified disorders of right external ear: Secondary | ICD-10-CM | POA: Diagnosis not present

## 2017-10-22 MED ORDER — ONDANSETRON 4 MG PO TBDP: 4.0000 mg | ORAL_TABLET | Freq: Three times a day (TID) | ORAL | 0 refills | Status: DC | PRN

## 2017-10-22 MED ORDER — AMOXICILLIN-POT CLAVULANATE 875-125 MG PO TABS: 1.0000 | ORAL_TABLET | Freq: Two times a day (BID) | ORAL | 0 refills | Status: AC

## 2017-10-22 NOTE — ED Triage Notes (Signed)
Pt reports right ear pain with a lump behind her ear lobe for two days along with chills/sweats throughout the day.  She has been taking Tylenol and Ibuprofen.

## 2017-10-22 NOTE — ED Provider Notes (Signed)
[facility]    CSN: [id number] Arrival date & time: 10/22/17  0847     History   Chief Complaint Chief Complaint  Patient presents with  . Otalgia    right  . Fever    HPI Valerie Walter is a 37 y.o. female negative no past medical history presenting today for evaluation of right ear pain and swelling.  Patient states that for the past 2 to 3 days she has noticed swelling to her outer ear with associated pain.  Since she has also developed an "knot" behind her ear.  This has been causing headaches and discomfort on the right side of her face.  Denies associated URI symptoms of rhinorrhea or congestion.  Has some had some mild tickling in her throat.  Denies cough.  Denies fevers.  She has been taking ibuprofen with minimal relief.  She does recall recently using an earring that was not nickel/lead free that she wore temporarily.  HPI  Past Medical History:  Diagnosis Date  . Miscarriage   . Ovarian cyst   . Seasonal allergies   . Shingles   . Thyroid disease   . Trichomonas   . Umbilical hernia     Patient Active Problem List   Diagnosis Date Noted  . Facial cellulitis 03/03/2016  . Hypokalemia 03/03/2016  . Trichomoniasis 03/03/2016  . Sepsis ([place]) 03/02/2016    Past Surgical History:  Procedure Laterality Date  . APPENDECTOMY    . DILATION AND CURETTAGE OF UTERUS    . IRRIGATION AND DEBRIDEMENT ABSCESS      OB History    Gravida  5   Para  2   Term  2   Preterm  0   AB  3   Living  2     SAB  2   TAB  1   Ectopic  0   Multiple  0   Live Births  2            Home Medications    Prior to Admission medications   Medication Sig Start Date End Date Taking? Authorizing Provider  acetaminophen (TYLENOL) 325 MG tablet Take 650 mg by mouth every 6 (six) hours as needed for mild pain.   Yes [provider]  albuterol (PROVENTIL HFA;VENTOLIN HFA) 108 (90 Base) MCG/ACT inhaler Inhale 1 puff into the lungs every 6 (six)  hours as needed for wheezing or shortness of breath.   Yes [provider]  cromolyn (NASALCROM) 5.2 MG/ACT nasal spray Place 1 spray into both nostrils 4 (four) times daily. 01/15/17  Yes Burky, Lanelle Bal B, NP  ibuprofen (ADVIL,MOTRIN) 600 MG tablet Take 1 tablet (600 mg total) by mouth every 6 (six) hours as needed. Patient taking differently: Take 600 mg by mouth every 6 (six) hours as needed for mild pain.  02/08/16  Yes Leftwich-Kirby, Kathie Dike, CNM  medroxyPROGESTERone (DEPO-PROVERA) 150 MG/ML injection Inject 150 mg into the muscle every 3 (three) months. Pt is having next dose on 02/16/16   Yes [provider]  amoxicillin-clavulanate (AUGMENTIN) 875-125 MG tablet Take 1 tablet by mouth every 12 (twelve) hours for 7 days. 10/22/17 10/29/17  Wieters, Hallie C, PA-C  HYDROcodone-acetaminophen (NORCO/VICODIN) 5-325 MG tablet Take 2 tablets by mouth every 4 (four) hours as needed. 10/09/16   Doristine Devoid, PA-C  methocarbamol (ROBAXIN) 500 MG tablet Take 1 tablet (500 mg total) by mouth 2 (two) times daily. 02/17/17   Volanda Napoleon, PA-C  ondansetron [facility]  ODT) 4 MG disintegrating tablet Take 1 tablet (4 mg total) by mouth every 8 (eight) hours as needed for nausea or vomiting. 10/22/17   Wieters, Elesa Hacker, PA-C    Family History Family History  Problem Relation Age of Onset  . Cancer Mother     Social History Social History   Tobacco Use  . Smoking status: Current Every Day Smoker    Packs/day: 1.00    Types: Cigarettes  . Smokeless tobacco: Never Used  Substance Use Topics  . Alcohol use: Yes    Comment: occasional  . Drug use: No     Allergies   Other   Review of Systems Review of Systems  Constitutional: Positive for fatigue. Negative for activity change, appetite change, chills and fever.  HENT: Positive for ear pain. Negative for congestion, rhinorrhea, sinus pressure, sore throat and trouble swallowing.   Eyes: Negative for discharge and redness.    Respiratory: Negative for cough, chest tightness and shortness of breath.   Cardiovascular: Negative for chest pain.  Gastrointestinal: Negative for abdominal pain, diarrhea, nausea and vomiting.  Musculoskeletal: Negative for myalgias.  Skin: Negative for rash.  Neurological: Positive for headaches. Negative for dizziness, weakness and light-headedness.     Physical Exam Triage Vital Signs ED Triage Vitals  Enc Vitals Group     BP 10/22/17 0909 118/62     Pulse Rate 10/22/17 0909 64     Resp 10/22/17 0909 16     Temp 10/22/17 0909 98.8 F (37.1 C)     Temp Source 10/22/17 0909 Oral     SpO2 10/22/17 0909 100 %     Weight --      Height --      Head Circumference --      Peak Flow --      Pain Score 10/22/17 0910 8     Pain Loc --      Pain Edu? --      Excl. in [place]? --    No data found.  Updated Vital Signs BP 118/62 (BP Location: Left Arm)   Pulse 64   Temp 98.8 F (37.1 C) (Oral)   Resp 16   SpO2 100%   Visual Acuity Right Eye Distance:   Left Eye Distance:   Bilateral Distance:    Right Eye Near:   Left Eye Near:    Bilateral Near:     Physical Exam  Constitutional: She is oriented to person, place, and time. She appears well-developed and well-nourished.  No acute distress  HENT:  Head: Normocephalic and atraumatic.  Nose: Nose normal.  Right external alar with swelling, erythema and slight drainage specifically near lower earlobe near pierced hole, tenderness to postauricular area with lymphadenopathy Right EAC without edema or erythema, TM nonerythematous, good cone of light  Left external ear, EAC and TM without abnormality  Oral mucosa pink and moist, no tonsillar enlargement or exudate. Posterior pharynx patent and nonerythematous, no uvula deviation or swelling. Normal phonation.  Eyes: Conjunctivae are normal.  Neck: Neck supple.  Cardiovascular: Normal rate.  Pulmonary/Chest: Effort normal. No respiratory distress.  Breathing comfortably  at rest, CTABL, no wheezing, rales or other adventitious sounds auscultated  Abdominal: She exhibits no distension.  Musculoskeletal: Normal range of motion.  Neurological: She is alert and oriented to person, place, and time.  Skin: Skin is warm and dry.  Psychiatric: She has a normal mood and affect.  Nursing note and vitals reviewed.    UC Treatments / Results  Labs (all labs ordered are listed, but only abnormal results are displayed) Labs Reviewed - No data to display  EKG None  Radiology No results found.  Procedures Procedures (including critical care time)  Medications Ordered in UC Medications - No data to display  Initial Impression / Assessment and Plan / UC Course  I have reviewed the triage vital signs and the nursing notes.  Pertinent labs & imaging results that were available during my care of the patient were reviewed by me and considered in my medical decision making (see chart for details).    Patient with external swelling and lymphadenopathy to postauricular area, will treat for local cellulitis with Augmentin, tenderness seems to be more related to lymphadenopathy versus mastoiditis, patient does not have history of DM.  Will have patient follow-up in 2 to 3 days if symptoms not improving with treatment with antibiotic.  Also recommended ibuprofen and Tylenol to help with pain and swelling.  Headaches and facial pain likely secondary to pain radiating.  Continue to monitor, return if developing vision changes, neck stiffness.  Fevers.Discussed strict return precautions. Patient verbalized understanding and is agreeable with plan.   Final Clinical Impressions(s) / UC Diagnoses   Final diagnoses:  Swelling of right external ear     Discharge Instructions     The outer part of your ear appears infected Please begin taking Augmentin twice daily for the next week; please use Zofran 30 minutes prior to taking Augmentin and have food on your stomach Please  take Tylenol and ibuprofen to help with pain, swelling Warm compresses to help with swelling  Please follow-up in 2 to 3 days if symptoms not improving, follow-up if developing worsening swelling, pain, drainage, difficulty moving neck, fevers, difficulty opening jaw    ED Prescriptions    Medication Sig Dispense Auth. Provider   amoxicillin-clavulanate (AUGMENTIN) 875-125 MG tablet Take 1 tablet by mouth every 12 (twelve) hours for 7 days. 14 tablet Wieters, Hallie C, PA-C   ondansetron (ZOFRAN ODT) 4 MG disintegrating tablet Take 1 tablet (4 mg total) by mouth every 8 (eight) hours as needed for nausea or vomiting. 20 tablet Wieters, Volcano C, PA-C     Controlled Substance Prescriptions [place] Controlled Substance Registry consulted? Not Applicable   Janith Lima, [place] 10/22/17 1025

## 2017-10-22 NOTE — Discharge Instructions (Addendum)
The outer part of your ear appears infected Please begin taking Augmentin twice daily for the next week; please use Zofran 30 minutes prior to taking Augmentin and have food on your stomach Please take Tylenol and ibuprofen to help with pain, swelling Warm compresses to help with swelling  Please follow-up in 2 to 3 days if symptoms not improving, follow-up if developing worsening swelling, pain, drainage, difficulty moving neck, fevers, difficulty opening jaw

## 2018-01-01 ENCOUNTER — Other Ambulatory Visit: Payer: Self-pay | Admitting: "Women's Health Care

## 2018-01-01 DIAGNOSIS — Z1231 Encounter for screening mammogram for malignant neoplasm of breast: Secondary | ICD-10-CM

## 2018-02-17 ENCOUNTER — Ambulatory Visit: Payer: Medicaid Other

## 2019-03-22 ENCOUNTER — Ambulatory Visit (HOSPITAL_COMMUNITY)
Admission: EM | Admit: 2019-03-22 | Discharge: 2019-03-22 | Disposition: A | Discharge status: Home / Self Care | Payer: Medicaid Other | Attending: Family Medicine | Admitting: Family Medicine

## 2019-03-22 ENCOUNTER — Encounter (HOSPITAL_COMMUNITY): Payer: Self-pay | Admitting: Emergency Medicine

## 2019-03-22 ENCOUNTER — Other Ambulatory Visit: Payer: Self-pay

## 2019-03-22 DIAGNOSIS — L0291 Cutaneous abscess, unspecified: Secondary | ICD-10-CM | POA: Diagnosis not present

## 2019-03-22 MED ORDER — CEPHALEXIN 250 MG/5ML PO SUSR: 500.0000 mg | Freq: Four times a day (QID) | ORAL | 0 refills | Status: AC

## 2019-03-22 MED ORDER — NAPROXEN 500 MG PO TABS: 500.0000 mg | ORAL_TABLET | Freq: Two times a day (BID) | ORAL | 0 refills | Status: DC

## 2019-03-22 MED ORDER — ACETAMINOPHEN 500 MG PO TABS: 500.0000 mg | ORAL_TABLET | Freq: Four times a day (QID) | ORAL | 0 refills | Status: DC | PRN

## 2019-03-22 NOTE — Discharge Instructions (Addendum)
Warm compresses to the area a few time a day for 10 - 15 minutes at a time.  We will start on antibiotics.  Tylenol and naproxen for pain If the area becomes larger despite treatment you will need to come back and for possible drainage of the area. Follow up as needed for continued or worsening symptoms

## 2019-03-22 NOTE — ED Triage Notes (Signed)
Pt reports a left lower breast lump that she first noticed yesterday.  Pt states that it is sore.

## 2019-03-23 NOTE — ED Provider Notes (Signed)
[facility]    CSN: [id number] Arrival date & time: 03/22/19  1416      History   Chief Complaint Chief Complaint  Patient presents with  . Breast Mass    left    HPI Valerie Walter is a 39 y.o. female.   Patient is a 39 year old female presents today with abscess to left breast area.  Per patient she noticed this yesterday.  The problem has worsened.  It has become more painful, swollen and erythematous.  Denies any drainage.  Denies any history of same or history of MRSA.  No fever, chills, bodies or night sweats.  She has not taken any medication for her symptoms.  ROS per HPI      Past Medical History:  Diagnosis Date  . Miscarriage   . Ovarian cyst   . Seasonal allergies   . Shingles   . Thyroid disease   . Trichomonas   . Umbilical hernia     Patient Active Problem List   Diagnosis Date Noted  . Facial cellulitis 03/03/2016  . Hypokalemia 03/03/2016  . Trichomoniasis 03/03/2016  . Sepsis ([place]) 03/02/2016    Past Surgical History:  Procedure Laterality Date  . APPENDECTOMY    . DILATION AND CURETTAGE OF UTERUS    . IRRIGATION AND DEBRIDEMENT ABSCESS      OB History    Gravida  5   Para  2   Term  2   Preterm  0   AB  3   Living  2     SAB  2   TAB  1   Ectopic  0   Multiple  0   Live Births  2            Home Medications    Prior to Admission medications   Medication Sig Start Date End Date Taking? Authorizing Provider  acetaminophen (TYLENOL) 500 MG tablet Take 1 tablet (500 mg total) by mouth every 6 (six) hours as needed. 03/22/19   Loura Halt A, NP  albuterol (PROVENTIL HFA;VENTOLIN HFA) 108 (90 Base) MCG/ACT inhaler Inhale 1 puff into the lungs every 6 (six) hours as needed for wheezing or shortness of breath.    [provider]  cephALEXin (KEFLEX) 250 MG/5ML suspension Take 10 mLs (500 mg total) by mouth 4 (four) times daily for 7 days. 03/22/19 03/29/19  Loura Halt A, NP  naproxen (NAPROSYN) 500  MG tablet Take 1 tablet (500 mg total) by mouth 2 (two) times daily. 03/22/19   Loura Halt A, NP  cromolyn (NASALCROM) 5.2 MG/ACT nasal spray Place 1 spray into both nostrils 4 (four) times daily. 01/15/17 03/22/19  Zigmund Gottron, NP  medroxyPROGESTERone (DEPO-PROVERA) 150 MG/ML injection Inject 150 mg into the muscle every 3 (three) months. Pt is having next dose on 02/16/16  03/22/19  [provider]    Family History Family History  Problem Relation Age of Onset  . Cancer Mother     Social History Social History   Tobacco Use  . Smoking status: Current Every Day Smoker    Packs/day: 1.00    Types: Cigarettes  . Smokeless tobacco: Never Used  Substance Use Topics  . Alcohol use: Yes    Comment: occasional  . Drug use: No     Allergies   Other   Review of Systems Review of Systems   Physical Exam Triage Vital Signs ED Triage Vitals  Enc Vitals Group     BP 03/22/19 1456  116/75     Pulse Rate 03/22/19 1456 68     Resp 03/22/19 1456 14     Temp 03/22/19 1456 99.1 F (37.3 C)     Temp Source 03/22/19 1456 Oral     SpO2 03/22/19 1456 99 %     Weight --      Height --      Head Circumference --      Peak Flow --      Pain Score 03/22/19 1454 5     Pain Loc --      Pain Edu? --      Excl. in [place]? --    No data found.  Updated Vital Signs BP 116/75 (BP Location: Right Arm)   Pulse 68   Temp 99.1 F (37.3 C) (Oral)   Resp 14   SpO2 99%   Visual Acuity Right Eye Distance:   Left Eye Distance:   Bilateral Distance:    Right Eye Near:   Left Eye Near:    Bilateral Near:     Physical Exam Vitals and nursing note reviewed.  Constitutional:      General: She is not in acute distress.    Appearance: Normal appearance. She is not ill-appearing, toxic-appearing or diaphoretic.  HENT:     Head: Normocephalic.     Nose: Nose normal.  Eyes:     Conjunctiva/sclera: Conjunctivae normal.  Pulmonary:     Effort: Pulmonary effort is normal.  Chest:        Comments: Approximate half centimeter abscess to left chest wall near her left breast.  Tender to palpation with central fluctuance and surrounding induration.  Mild erythema.  No drainage Musculoskeletal:        General: Normal range of motion.     Cervical back: Normal range of motion.  Skin:    General: Skin is warm and dry.     Findings: No rash.  Neurological:     Mental Status: She is alert.  Psychiatric:        Mood and Affect: Mood normal.      UC Treatments / Results  Labs (all labs ordered are listed, but only abnormal results are displayed) Labs Reviewed - No data to display  EKG   Radiology No results found.  Procedures Procedures (including critical care time)  Medications Ordered in UC Medications - No data to display  Initial Impression / Assessment and Plan / UC Course  I have reviewed the triage vital signs and the nursing notes.  Pertinent labs & imaging results that were available during my care of the patient were reviewed by me and considered in my medical decision making (see chart for details).     Abscess- warm compresses Keflex for abx coverage Tylenol and naproxen for pain Follow up as needed for continued or worsening symptoms  Final Clinical Impressions(s) / UC Diagnoses   Final diagnoses:  Abscess     Discharge Instructions     Warm compresses to the area a few time a day for 10 - 15 minutes at a time.  We will start on antibiotics.  Tylenol and naproxen for pain If the area becomes larger despite treatment you will need to come back and for possible drainage of the area. Follow up as needed for continued or worsening symptoms      ED Prescriptions    Medication Sig Dispense Auth. Provider   naproxen (NAPROSYN) 500 MG tablet Take 1 tablet (500 mg total) by mouth 2 (  two) times daily. 30 tablet Phuong Hillary A, NP   acetaminophen (TYLENOL) 500 MG tablet Take 1 tablet (500 mg total) by mouth every 6 (six) hours as  needed. 30 tablet Vara Mairena A, NP   cephALEXin (KEFLEX) 250 MG/5ML suspension Take 10 mLs (500 mg total) by mouth 4 (four) times daily for 7 days. 280 mL Denise Washburn A, NP     PDMP not reviewed this encounter.   Loura Halt A, NP 03/23/19 [phone]

## 2019-04-21 ENCOUNTER — Other Ambulatory Visit: Payer: Self-pay

## 2019-04-21 ENCOUNTER — Encounter (HOSPITAL_COMMUNITY): Payer: Self-pay | Admitting: Emergency Medicine

## 2019-04-21 ENCOUNTER — Emergency Department (HOSPITAL_COMMUNITY): Payer: Medicaid Other

## 2019-04-21 ENCOUNTER — Emergency Department (HOSPITAL_COMMUNITY)
Admission: EM | Admit: 2019-04-21 | Discharge: 2019-04-21 | Disposition: A | Discharge status: Home / Self Care | Payer: Medicaid Other | Attending: Emergency Medicine | Admitting: Emergency Medicine

## 2019-04-21 DIAGNOSIS — R222 Localized swelling, mass and lump, trunk: Secondary | ICD-10-CM | POA: Diagnosis present

## 2019-04-21 DIAGNOSIS — L0231 Cutaneous abscess of buttock: Secondary | ICD-10-CM | POA: Diagnosis not present

## 2019-04-21 DIAGNOSIS — F17210 Nicotine dependence, cigarettes, uncomplicated: Secondary | ICD-10-CM | POA: Diagnosis not present

## 2019-04-21 DIAGNOSIS — Z79899 Other long term (current) drug therapy: Secondary | ICD-10-CM | POA: Diagnosis not present

## 2019-04-21 LAB — CBC WITH DIFFERENTIAL/PLATELET
Abs Immature Granulocytes: 0.03 10*3/uL (ref 0.00–0.07)
Basophils Absolute: 0.1 10*3/uL (ref 0.0–0.1)
Basophils Relative: 0 %
Eosinophils Absolute: 0.1 10*3/uL (ref 0.0–0.5)
Eosinophils Relative: 1 %
HCT: 43.7 % (ref 36.0–46.0)
Hemoglobin: 14 g/dL (ref 12.0–15.0)
Immature Granulocytes: 0 %
Lymphocytes Relative: 26 %
Lymphs Abs: 3.2 10*3/uL (ref 0.7–4.0)
MCH: 28.6 pg (ref 26.0–34.0)
MCHC: 32 g/dL (ref 30.0–36.0)
MCV: 89.2 fL (ref 80.0–100.0)
Monocytes Absolute: 0.7 10*3/uL (ref 0.1–1.0)
Monocytes Relative: 5 %
Neutro Abs: 8.2 10*3/uL — ABNORMAL HIGH (ref 1.7–7.7)
Neutrophils Relative %: 68 %
Platelets: 351 10*3/uL (ref 150–400)
RBC: 4.9 MIL/uL (ref 3.87–5.11)
RDW: 14.5 % (ref 11.5–15.5)
WBC: 12.3 10*3/uL — ABNORMAL HIGH (ref 4.0–10.5)
nRBC: 0 % (ref 0.0–0.2)

## 2019-04-21 LAB — BASIC METABOLIC PANEL
Anion gap: 10 (ref 5–15)
BUN: 10 mg/dL (ref 6–20)
CO2: 21 mmol/L — ABNORMAL LOW (ref 22–32)
Calcium: 8.8 mg/dL — ABNORMAL LOW (ref 8.9–10.3)
Chloride: 108 mmol/L (ref 98–111)
Creatinine, Ser: 0.74 mg/dL (ref 0.44–1.00)
GFR calc Af Amer: >60 mL/min (ref >60)
GFR calc non Af Amer: >60 mL/min (ref >60)
Glucose, Bld: 91 mg/dL (ref 70–99)
Potassium: 3.6 mmol/L (ref 3.5–5.1)
Sodium: 139 mmol/L (ref 135–145)

## 2019-04-21 LAB — POC URINE PREG, ED: Preg Test, Ur: NEGATIVE

## 2019-04-21 MED ORDER — HYDROMORPHONE HCL 1 MG/ML IJ SOLN
0.5000 mg | Freq: Once | INTRAMUSCULAR | Status: AC
  Administered 2019-04-21: 0.5 mg via INTRAVENOUS
  Filled 2019-04-21: qty 1

## 2019-04-21 MED ORDER — SODIUM CHLORIDE (PF) 0.9 % IJ SOLN
INTRAMUSCULAR | Status: AC
  Filled 2019-04-21: qty 50

## 2019-04-21 MED ORDER — DOXYCYCLINE HYCLATE 100 MG PO CAPS: 100.0000 mg | ORAL_CAPSULE | Freq: Two times a day (BID) | ORAL | 0 refills | Status: AC

## 2019-04-21 MED ORDER — HYDROCODONE-ACETAMINOPHEN 5-325 MG PO TABS: 2.0000 | ORAL_TABLET | Freq: Once | ORAL | Status: DC

## 2019-04-21 MED ORDER — IOHEXOL 300 MG/ML  SOLN
100.0000 mL | Freq: Once | INTRAMUSCULAR | Status: AC | PRN
  Administered 2019-04-21: 100 mL via INTRAVENOUS

## 2019-04-21 MED ORDER — SODIUM CHLORIDE 0.9 % IV BOLUS
500.0000 mL | Freq: Once | INTRAVENOUS | Status: AC
  Administered 2019-04-21: 500 mL via INTRAVENOUS

## 2019-04-21 MED ORDER — LIDOCAINE-EPINEPHRINE (PF) 2 %-1:200000 IJ SOLN
10.0000 mL | Freq: Once | INTRAMUSCULAR | Status: AC
  Administered 2019-04-21: 10 mL
  Filled 2019-04-21: qty 10

## 2019-04-21 MED ORDER — MORPHINE SULFATE (PF) 4 MG/ML IV SOLN
4.0000 mg | Freq: Once | INTRAVENOUS | Status: AC
  Administered 2019-04-21: 4 mg via INTRAVENOUS
  Filled 2019-04-21: qty 1

## 2019-04-21 NOTE — ED Notes (Signed)
Assisted [place], [place] with I&D of abscess on left buttocks.

## 2019-04-21 NOTE — ED Notes (Signed)
Pt returned from CT °

## 2019-04-21 NOTE — ED Triage Notes (Signed)
Pt reports that has an abscess in her buttocks that has gotten bigger and more painful over the past couple days. Denies any drainage. Tried soaks, warm compresses without any relief/

## 2019-04-21 NOTE — Discharge Instructions (Addendum)
You have been diagnosed today with Left Buttock Abscess.  At this time there does not appear to be the presence of an emergent medical condition, however there is always the potential for conditions to change. Please read and follow the below instructions.  Please return to the Emergency Department immediately for any new or worsening symptoms. Please be sure to follow up with your Primary Care Provider within one week regarding your visit today; please call their office to schedule an appointment even if you are feeling better for a follow-up visit. Please drink plenty of water and get plenty of rest.  Please take your antibiotic doxycycline as prescribed to treat your infection.  Please continue warm soaks 3-4 times daily at home to facilitate continued drainage and replaced your bandage each time.  Please call the general surgeon Dr. Ninfa Linden on your discharge paperwork to schedule follow-up appointment. Please have your abscess rechecked in 2 days (48 hours).  You may have your recheck performed here at the emergency department, or an urgent care, or your primary care doctor's office or at the surgeon's office. Your CT scan today showed a 4.4 cm left ovarian cyst and uterine fibroids.  Please discuss the incidental findings with your primary care provider and your OB/GYN at your follow-up visits.  Get help right away if you: Have problems moving or using your legs. Have severe or increasing pain. Have swelling in the affected area that suddenly gets worse. Have a large increase in bleeding or passing of pus. Develop chills or a fever. You have any new/concerning or worsening of symptoms  Please read the additional information packets attached to your discharge summary.  Do not take your medicine if  develop an itchy rash, swelling in your mouth or lips, or difficulty breathing; call 911 and seek immediate emergency medical attention if this occurs.  Note: Portions of this text may have been  transcribed using voice recognition software. Every effort was made to ensure accuracy; however, inadvertent computerized transcription errors may still be present.

## 2019-04-21 NOTE — ED Notes (Signed)
Pt transported to CT ?

## 2019-04-21 NOTE — ED Provider Notes (Signed)
[facility] [facility] DEPT Provider Note   CSN: [id number] Arrival date & time: 04/21/19  1254     History Chief Complaint  Patient presents with  . Abscess    buttocks    Valerie Walter is a 39 y.o. female history of obesity, thyroid disease, umbilical hernia.  Patient presents today for concern of perianal abscess onset 3 days ago, pain described as a constant sharp pressure worsened with sitting improved with laying on her stomach nonradiating moderate-severe in intensity.  Patient reports similar pain several years ago, she reports that she taken into surgery for drainage and packing placement and was followed by general surgery for this.  Denies fever/chills, headache, neck pain, chest pain/shortness of breath, nausea/vomiting, dysuria/hematuria, vaginal bleeding/discharge, blood in the stool or any additional concerns.  HPI     Past Medical History:  Diagnosis Date  . Miscarriage   . Ovarian cyst   . Seasonal allergies   . Shingles   . Thyroid disease   . Trichomonas   . Umbilical hernia     Patient Active Problem List   Diagnosis Date Noted  . Facial cellulitis 03/03/2016  . Hypokalemia 03/03/2016  . Trichomoniasis 03/03/2016  . Sepsis ([place]) 03/02/2016    Past Surgical History:  Procedure Laterality Date  . APPENDECTOMY    . DILATION AND CURETTAGE OF UTERUS    . IRRIGATION AND DEBRIDEMENT ABSCESS       OB History    Gravida  5   Para  2   Term  2   Preterm  0   AB  3   Living  2     SAB  2   TAB  1   Ectopic  0   Multiple  0   Live Births  2           Family History  Problem Relation Age of Onset  . Cancer Mother     Social History   Tobacco Use  . Smoking status: Current Every Day Smoker    Packs/day: 1.00    Types: Cigarettes  . Smokeless tobacco: Never Used  Substance Use Topics  . Alcohol use: Yes    Comment: occasional  . Drug use: No    Home Medications Prior to Admission  medications   Medication Sig Start Date End Date Taking? Authorizing Provider  acetaminophen (TYLENOL) 500 MG tablet Take 1 tablet (500 mg total) by mouth every 6 (six) hours as needed. 03/22/19   Loura Halt A, NP  albuterol (PROVENTIL HFA;VENTOLIN HFA) 108 (90 Base) MCG/ACT inhaler Inhale 1 puff into the lungs every 6 (six) hours as needed for wheezing or shortness of breath.    [provider]  doxycycline (VIBRAMYCIN) 100 MG capsule Take 1 capsule (100 mg total) by mouth 2 (two) times daily for 7 days. 04/21/19 04/28/19  Nuala Alpha A, PA-C  naproxen (NAPROSYN) 500 MG tablet Take 1 tablet (500 mg total) by mouth 2 (two) times daily. 03/22/19   Loura Halt A, NP  cromolyn (NASALCROM) 5.2 MG/ACT nasal spray Place 1 spray into both nostrils 4 (four) times daily. 01/15/17 03/22/19  Zigmund Gottron, NP  medroxyPROGESTERone (DEPO-PROVERA) 150 MG/ML injection Inject 150 mg into the muscle every 3 (three) months. Pt is having next dose on 02/16/16  03/22/19  [provider]    Allergies    Other  Review of Systems   Review of Systems Ten systems are reviewed and are negative for acute change except as  noted in the HPI  Physical Exam Updated Vital Signs BP 118/89   Pulse 90   Temp 99.8 F (37.7 C) (Oral)   Resp 16   SpO2 99%   Physical Exam Constitutional:      General: She is not in acute distress.    Appearance: Normal appearance. She is well-developed. She is not ill-appearing or diaphoretic.  HENT:     Head: Normocephalic and atraumatic.     Right Ear: External ear normal.     Left Ear: External ear normal.     Nose: Nose normal.  Eyes:     General: Vision grossly intact. Gaze aligned appropriately.     Pupils: Pupils are equal, round, and reactive to light.  Neck:     Trachea: Trachea and phonation normal. No tracheal deviation.  Pulmonary:     Effort: Pulmonary effort is normal. No respiratory distress.  Abdominal:     General: There is no distension.      Palpations: Abdomen is soft.     Tenderness: There is no abdominal tenderness. There is no guarding or rebound.  Genitourinary:    Comments: Examination chaperoned by Winfred Burn.  Approximately 7 cm perianal induration/fluctuance tracking towards the rectum.  Patient refused internal examination. Musculoskeletal:        General: Normal range of motion.     Cervical back: Normal range of motion.  Skin:    General: Skin is warm and dry.  Neurological:     Mental Status: She is alert.     GCS: GCS eye subscore is 4. GCS verbal subscore is 5. GCS motor subscore is 6.     Comments: Speech is clear and goal oriented, follows commands Major Cranial nerves without deficit, no facial droop Moves extremities without ataxia, coordination intact  Psychiatric:        Behavior: Behavior normal.     ED Results / Procedures / Treatments   Labs (all labs ordered are listed, but only abnormal results are displayed) Labs Reviewed  CBC WITH DIFFERENTIAL/PLATELET - Abnormal; Notable for the following components:      Result Value   WBC 12.3 (*)    Neutro Abs 8.2 (*)    All other components within normal limits  BASIC METABOLIC PANEL - Abnormal; Notable for the following components:   CO2 21 (*)    Calcium 8.8 (*)    All other components within normal limits  AEROBIC CULTURE (SUPERFICIAL SPECIMEN)  POC URINE PREG, ED    EKG None  Radiology CT PELVIS W CONTRAST  Result Date: 04/21/2019 CLINICAL DATA:  Left buttock abscess with increased pain, initial encounter EXAM: CT PELVIS WITH CONTRAST TECHNIQUE: Multidetector CT imaging of the pelvis was performed using the standard protocol following the bolus administration of intravenous contrast. CONTRAST:  166mL OMNIPAQUE IOHEXOL 300 MG/ML  SOLN COMPARISON:  None. FINDINGS: Urinary Tract: Bladder is partially distended. No ureteral obstructive changes are seen. Bowel:  Visualized bowel is unremarkable. Vascular/Lymphatic: No vascular or lymphatic  abnormality is seen. Reproductive: Uterus is well visualized with enhancing lesions consistent with uterine fibroids. A dominant left ovarian cyst is seen which measures 4.4 cm. Other:  None. Musculoskeletal: Bony structures are within normal limits. In the medial aspect of the left buttock there is a focal fluid collection with surrounding inflammatory changes consistent with abscess and localized cellulitis. The fluid collection measures approximately 4.2 x 1.9 x 3.5 cm in greatest AP, transverse and craniocaudad projections. No other focal soft tissue abnormality is seen. IMPRESSION:  Left buttock abscess with surrounding cellulitis. Uterine fibroids Electronically Signed   By: Inez Catalina M.D.   On: 04/21/2019 17:18    Procedures .Marland KitchenIncision and Drainage  Date/Time: 04/21/2019 7:57 PM Performed by: Deliah Boston, PA-C Authorized by: Deliah Boston, PA-C   Consent:    Consent obtained:  Verbal   Consent given by:  Patient   Risks discussed:  Bleeding, damage to other organs, infection, incomplete drainage and pain Location:    Type:  Abscess   Size:  4.2   Location:  Anogenital   Anogenital location:  Perianal Pre-procedure details:    Skin preparation:  Betadine Anesthesia (see MAR for exact dosages):    Anesthesia method:  Local infiltration   Local anesthetic:  Lidocaine 1% WITH epi Procedure type:    Complexity:  Simple Procedure details:    Incision types:  Single straight   Wound management:  Probed and deloculated   Drainage:  Purulent   Drainage amount:  Moderate   Wound treatment:  Wound left open   Packing materials:  1/2 in iodoform gauze   Amount 1/2" iodoform:  5in; 2.5 in/2.5 out Post-procedure details:    Patient tolerance of procedure:  Tolerated well, no immediate complications Comments:     Procedure chaperoned by Joellen Jersey RN.   (including critical care time)  Medications Ordered in ED Medications  sodium chloride 0.9 % bolus 500 mL (0 mLs  Intravenous Stopped 04/21/19 1709)  morphine 4 MG/ML injection 4 mg (4 mg Intravenous Given 04/21/19 1602)  HYDROmorphone (DILAUDID) injection 0.5 mg (0.5 mg Intravenous Given 04/21/19 1642)  iohexol (OMNIPAQUE) 300 MG/ML solution 100 mL (100 mLs Intravenous Contrast Given 04/21/19 1659)  sodium chloride (PF) 0.9 % injection (  Given by Other 04/21/19 1702)  lidocaine-EPINEPHrine (XYLOCAINE W/EPI) 2 %-1:200000 (PF) injection 10 mL (10 mLs Infiltration Given by Other 04/21/19 1814)    ED Course  I have reviewed the triage vital signs and the nursing notes.  Pertinent labs & imaging results that were available during my care of the patient were reviewed by me and considered in my medical decision making (see chart for details).    MDM Rules/Calculators/A&P                     39 year old female presents today for concern of perianal abscess beginning 2-3 days ago.  On exam she is well-appearing no acute distress, vital signs stable, nontoxic appearing.  She reports history of similar in the past requiring surgical intervention.  She has approximately 7 cm area of induration along the left buttock tracking towards the rectum, she refused rectal examination to see if there is any involvement on the rectal wall.  As she reports history of perianal abscesses requiring surgical intervention will obtain CT pelvis for assessment of deep involvement.  Basic blood work ordered.  Pain medication ordered. - CBC with leukocytosis of 12.3 with left shift.  Suspect is secondary due to her infection, she has no fever or tachycardia, nontoxic-appearing no  sepsis at this time.  BMP with CO2 21 calcium 8.8 otherwise within normal limits.  No emergent electrolyte derangement or evidence of kidney injury.  Pregnancy test negative  CT Pelvis: IMPRESSION:  Left buttock abscess with surrounding cellulitis.    Uterine fibroids   Case and CT imaging reviewed with Dr. Francia Greaves agrees with incision drainage and  discharge with antibiotics and general surgery follow-up. ------------ Patient's abscess amendable to incision drainage, procedure performed as above.  Packing  placed.  Patient informed to have recheck in 2 days.  Encouraged warm soaks at home and flushing.  Patient encouraged to call general surgery to schedule a follow-up appointment for reassessment.  Due to extent of abscess and induration will discharge patient on doxycycline 100 mg twice daily x7 days.  Additionally patient informed of incidental findings on CT scan including fibroids and cyst and to follow-up with PCP and OB/GYN.  At this time there does not appear to be any evidence of an acute emergency medical condition and the patient appears stable for discharge with appropriate outpatient follow up. Diagnosis was discussed with patient who verbalizes understanding of care plan and is agreeable to discharge. I have discussed return precautions with patient who verbalizes understanding of return precautions. Patient encouraged to follow-up with their PCP. All questions answered.  Note: Portions of this report may have been transcribed using voice recognition software. Every effort was made to ensure accuracy; however, inadvertent computerized transcription errors may still be present. Final Clinical Impression(s) / ED Diagnoses Final diagnoses:  Abscess of left buttock    Rx / DC Orders ED Discharge Orders         Ordered    doxycycline (VIBRAMYCIN) 100 MG capsule  2 times daily     04/21/19 2003           Gari Crown 04/21/19 2004    Valarie Merino, MD 04/22/19 2144

## 2019-04-21 NOTE — ED Notes (Signed)
PA Brandon at bedside.

## 2019-04-24 ENCOUNTER — Other Ambulatory Visit: Payer: Self-pay

## 2019-04-24 ENCOUNTER — Ambulatory Visit (HOSPITAL_COMMUNITY)
Admission: EM | Admit: 2019-04-24 | Discharge: 2019-04-24 | Discharge status: Against Medical Advice | Payer: Medicaid Other

## 2019-04-24 LAB — AEROBIC CULTURE W GRAM STAIN (SUPERFICIAL SPECIMEN): Culture: NORMAL

## 2019-04-24 LAB — AEROBIC CULTURE? (SUPERFICIAL SPECIMEN)

## 2019-04-25 ENCOUNTER — Encounter (HOSPITAL_COMMUNITY): Payer: Self-pay

## 2019-04-25 ENCOUNTER — Telehealth: Payer: Self-pay | Admitting: Emergency Medicine

## 2019-04-25 ENCOUNTER — Other Ambulatory Visit: Payer: Self-pay

## 2019-04-25 ENCOUNTER — Ambulatory Visit (HOSPITAL_COMMUNITY)
Admission: EM | Admit: 2019-04-25 | Discharge: 2019-04-25 | Disposition: A | Discharge status: Home / Self Care | Payer: Medicaid Other | Attending: Family Medicine | Admitting: Family Medicine

## 2019-04-25 DIAGNOSIS — L0231 Cutaneous abscess of buttock: Secondary | ICD-10-CM | POA: Diagnosis not present

## 2019-04-25 DIAGNOSIS — Z5189 Encounter for other specified aftercare: Secondary | ICD-10-CM | POA: Diagnosis not present

## 2019-04-25 NOTE — ED Provider Notes (Signed)
[facility]    CSN: [id number] Arrival date & time: 04/25/19  1101      History   Chief Complaint Chief Complaint  Patient presents with  . Abscess follow up    HPI Valerie Walter is a 39 y.o. female.   HPI  Patient is here for a follow-up after going to the emergency room.  She had perirectal abscess drained.  Packing was placed.  Patient is here because she states the packing needs to be removed.  She is much better.  She is taking antibiotics.  She is bathing in a warm tub.  She states that it is draining very little.  The pain is much improved.  The swelling is much improved.  Past Medical History:  Diagnosis Date  . Miscarriage   . Ovarian cyst   . Seasonal allergies   . Shingles   . Thyroid disease   . Trichomonas   . Umbilical hernia     Patient Active Problem List   Diagnosis Date Noted  . Facial cellulitis 03/03/2016  . Hypokalemia 03/03/2016  . Trichomoniasis 03/03/2016  . Sepsis ([place]) 03/02/2016    Past Surgical History:  Procedure Laterality Date  . APPENDECTOMY    . DILATION AND CURETTAGE OF UTERUS    . IRRIGATION AND DEBRIDEMENT ABSCESS      OB History    Gravida  5   Para  2   Term  2   Preterm  0   AB  3   Living  2     SAB  2   TAB  1   Ectopic  0   Multiple  0   Live Births  2            Home Medications    Prior to Admission medications   Medication Sig Start Date End Date Taking? Authorizing Provider  acetaminophen (TYLENOL) 500 MG tablet Take 1 tablet (500 mg total) by mouth every 6 (six) hours as needed. 03/22/19   Loura Halt A, NP  albuterol (PROVENTIL HFA;VENTOLIN HFA) 108 (90 Base) MCG/ACT inhaler Inhale 1 puff into the lungs every 6 (six) hours as needed for wheezing or shortness of breath.    [provider]  doxycycline (VIBRAMYCIN) 100 MG capsule Take 1 capsule (100 mg total) by mouth 2 (two) times daily for 7 days. 04/21/19 04/28/19  Nuala Alpha A, PA-C  EUTHYROX 50 MCG tablet  Take 50 mcg by mouth daily. 12/21/18   [provider]  naproxen (NAPROSYN) 500 MG tablet Take 1 tablet (500 mg total) by mouth 2 (two) times daily. 03/22/19   Loura Halt A, NP  cromolyn (NASALCROM) 5.2 MG/ACT nasal spray Place 1 spray into both nostrils 4 (four) times daily. 01/15/17 03/22/19  Zigmund Gottron, NP  medroxyPROGESTERone (DEPO-PROVERA) 150 MG/ML injection Inject 150 mg into the muscle every 3 (three) months. Pt is having next dose on 02/16/16  03/22/19  [provider]    Family History Family History  Problem Relation Age of Onset  . Cancer Mother     Social History Social History   Tobacco Use  . Smoking status: Current Every Day Smoker    Packs/day: 1.00    Types: Cigarettes  . Smokeless tobacco: Never Used  Substance Use Topics  . Alcohol use: Yes    Comment: occasional  . Drug use: No     Allergies   Other   Review of Systems Review of Systems  Skin: Positive for wound.  Physical Exam Triage Vital Signs ED Triage Vitals  Enc Vitals Group     BP 04/25/19 1214 105/85     Pulse Rate 04/25/19 1214 67     Resp 04/25/19 1214 18     Temp 04/25/19 1214 98.4 F (36.9 C)     Temp Source 04/25/19 1214 Oral     SpO2 04/25/19 1214 100 %     Weight 04/25/19 1211 193 lb 3.2 oz (87.6 kg)     Height --      Head Circumference --      Peak Flow --      Pain Score 04/25/19 1211 0     Pain Loc --      Pain Edu? --      Excl. in [place]? --    No data found.  Updated Vital Signs BP 105/85 (BP Location: Right Arm)   Pulse 67   Temp 98.4 F (36.9 C) (Oral)   Resp 18   Wt 87.6 kg   SpO2 100%   BMI 34.22 kg/m   Physical Exam Constitutional:      General: She is not in acute distress.    Appearance: Normal appearance. She is well-developed.  HENT:     Head: Normocephalic and atraumatic.     Mouth/Throat:     Comments: Mask in place, examined by observation Eyes:     Conjunctiva/sclera: Conjunctivae normal.     Pupils: Pupils are equal,  round, and reactive to light.  Cardiovascular:     Rate and Rhythm: Normal rate and regular rhythm.  Pulmonary:     Effort: Pulmonary effort is normal. No respiratory distress.  Genitourinary:    Comments: Rectal areas observed.  The induration is 2 x 3 cm.  The wound in the center is closing up.  There is no packing in place.  Its only mildly tender.  At the top of the gluteal cleft the patient has an open pocket that is a couple millimeters across.  No drainage.  I recommend she follow-up with a surgeon Musculoskeletal:        General: Normal range of motion.     Cervical back: Normal range of motion.  Skin:    General: Skin is warm and dry.  Neurological:     Mental Status: She is alert.  Psychiatric:        Mood and Affect: Mood normal.        Behavior: Behavior normal.      UC Treatments / Results  Labs (all labs ordered are listed, but only abnormal results are displayed) Labs Reviewed - No data to display  EKG   Radiology No results found.  Procedures Procedures (including critical care time)  Medications Ordered in UC Medications - No data to display  Initial Impression / Assessment and Plan / UC Course  I have reviewed the triage vital signs and the nursing notes.  Pertinent labs & imaging results that were available during my care of the patient were reviewed by me and considered in my medical decision making (see chart for details).      Final Clinical Impressions(s) / UC Diagnoses   Final diagnoses:  Wound check, abscess     Discharge Instructions     Continue antibiotics and warm soaks    ED Prescriptions    None     PDMP not reviewed this encounter.   Raylene Everts, MD 04/25/19 1414

## 2019-04-25 NOTE — Telephone Encounter (Signed)
Post ED Visit - Positive Culture Follow-up  Culture report reviewed by antimicrobial stewardship pharmacist: [place] Team []  Elenor Quinones, Pharm.D. []  Heide Guile, Pharm.D., BCPS AQ-ID []  Parks Neptune, Pharm.D., BCPS []  Alycia Rossetti, Pharm.D., BCPS []  [place], Pharm.D., BCPS, AAHIVP []  Legrand Como, Pharm.D., BCPS, AAHIVP []  Salome Arnt, PharmD, BCPS []  Johnnette Gourd, PharmD, BCPS []  Hughes Better, PharmD, BCPS []  Leeroy Cha, PharmD []  Laqueta Linden, PharmD, BCPS []  Albertina Parr, PharmD  [place] Team []  Leodis Sias, PharmD []  Lindell Spar, PharmD []  Royetta Asal, PharmD [x]  Graylin Shiver, Rph []  Rema Fendt) Glennon Mac, PharmD []  Arlyn Dunning, PharmD []  Netta Cedars, PharmD []  Dia Sitter, PharmD []  Leone Haven, PharmD []  Gretta Arab, PharmD []  Theodis Shove, PharmD []  Peggyann Juba, PharmD []  Reuel Boom, PharmD   Positive aerobic culture Treated with Doxcycline, organism sensitive to the same and no further patient follow-up is required at this time.  Sandi Raveling Gryffin Altice 04/25/2019, 2:56 PM

## 2019-04-25 NOTE — Discharge Instructions (Addendum)
Continue antibiotics and warm soaks

## 2019-04-25 NOTE — ED Triage Notes (Signed)
Pt is here for a follow up for an abscess states she needs the packing removed because it has not fell out yet.

## 2020-06-21 ENCOUNTER — Encounter (HOSPITAL_COMMUNITY): Payer: Self-pay

## 2020-06-21 ENCOUNTER — Ambulatory Visit (HOSPITAL_COMMUNITY)
Admission: EM | Admit: 2020-06-21 | Discharge: 2020-06-21 | Disposition: A | Discharge status: Home / Self Care | Payer: Medicaid Other | Attending: Family Medicine | Admitting: Family Medicine

## 2020-06-21 ENCOUNTER — Other Ambulatory Visit: Payer: Self-pay

## 2020-06-21 DIAGNOSIS — Z20822 Contact with and (suspected) exposure to covid-19: Secondary | ICD-10-CM | POA: Diagnosis not present

## 2020-06-21 DIAGNOSIS — Z791 Long term (current) use of non-steroidal anti-inflammatories (NSAID): Secondary | ICD-10-CM | POA: Diagnosis not present

## 2020-06-21 DIAGNOSIS — J069 Acute upper respiratory infection, unspecified: Secondary | ICD-10-CM | POA: Insufficient documentation

## 2020-06-21 DIAGNOSIS — J4521 Mild intermittent asthma with (acute) exacerbation: Secondary | ICD-10-CM | POA: Diagnosis not present

## 2020-06-21 DIAGNOSIS — J3089 Other allergic rhinitis: Secondary | ICD-10-CM | POA: Insufficient documentation

## 2020-06-21 DIAGNOSIS — R197 Diarrhea, unspecified: Secondary | ICD-10-CM | POA: Insufficient documentation

## 2020-06-21 DIAGNOSIS — R509 Fever, unspecified: Secondary | ICD-10-CM | POA: Insufficient documentation

## 2020-06-21 DIAGNOSIS — F17210 Nicotine dependence, cigarettes, uncomplicated: Secondary | ICD-10-CM | POA: Insufficient documentation

## 2020-06-21 DIAGNOSIS — J029 Acute pharyngitis, unspecified: Secondary | ICD-10-CM | POA: Diagnosis present

## 2020-06-21 LAB — POC INFLUENZA A AND B ANTIGEN (URGENT CARE ONLY)
INFLUENZA A ANTIGEN, POC: NEGATIVE
INFLUENZA B ANTIGEN, POC: NEGATIVE

## 2020-06-21 MED ORDER — ACETAMINOPHEN 160 MG/5ML PO SOLN
650.0000 mg | Freq: Once | ORAL | Status: AC
  Administered 2020-06-21: 650 mg via ORAL

## 2020-06-21 MED ORDER — PROMETHAZINE-DM 6.25-15 MG/5ML PO SYRP: 5.0000 mL | ORAL_SOLUTION | Freq: Four times a day (QID) | ORAL | 0 refills | Status: DC | PRN

## 2020-06-21 MED ORDER — ACETAMINOPHEN 160 MG/5ML PO SUSP
ORAL | Status: AC
  Filled 2020-06-21: qty 25

## 2020-06-21 MED ORDER — ALBUTEROL SULFATE HFA 108 (90 BASE) MCG/ACT IN AERS: 1.0000 | INHALATION_SPRAY | Freq: Four times a day (QID) | RESPIRATORY_TRACT | 1 refills | Status: DC | PRN

## 2020-06-21 NOTE — ED Provider Notes (Addendum)
[place]    CSN: [id number] Arrival date & time: 06/21/20  1534      History   Chief Complaint Chief Complaint  Patient presents with  . Cough  . Sore Throat  . Diarrhea  . Headache    HPI Valerie Walter is a 40 y.o. female.   Presenting today with 1 day history of fever, headache, sore throat, cough, diarrhea, fatigue.  Denies chest pain, shortness of breath, abdominal pain, vomiting.  So far taking Mucinex with minimal relief.  History of seasonal allergies and reactive airway disease, on antihistamines daily.  Several recent sick contacts at work.    Past Medical History:  Diagnosis Date  . Miscarriage   . Ovarian cyst   . Seasonal allergies   . Shingles   . Thyroid disease   . Trichomonas   . Umbilical hernia     Patient Active Problem List   Diagnosis Date Noted  . Facial cellulitis 03/03/2016  . Hypokalemia 03/03/2016  . Trichomoniasis 03/03/2016  . Sepsis ([place]) 03/02/2016    Past Surgical History:  Procedure Laterality Date  . APPENDECTOMY    . DILATION AND CURETTAGE OF UTERUS    . IRRIGATION AND DEBRIDEMENT ABSCESS      OB History    Gravida  5   Para  2   Term  2   Preterm  0   AB  3   Living  2     SAB  2   IAB  1   Ectopic  0   Multiple  0   Live Births  2            Home Medications    Prior to Admission medications   Medication Sig Start Date End Date Taking? Authorizing Provider  promethazine-dextromethorphan (PROMETHAZINE-DM) 6.25-15 MG/5ML syrup Take 5 mLs by mouth 4 (four) times daily as needed for cough. 06/21/20  Yes Volney American, PA-C  acetaminophen (TYLENOL) 500 MG tablet Take 1 tablet (500 mg total) by mouth every 6 (six) hours as needed. 03/22/19   Loura Halt A, NP  albuterol (VENTOLIN HFA) 108 (90 Base) MCG/ACT inhaler Inhale 1 puff into the lungs every 6 (six) hours as needed for wheezing or shortness of breath. 06/21/20   Volney American, PA-C  EUTHYROX 50 MCG tablet Take  50 mcg by mouth daily. 12/21/18   [provider]  naproxen (NAPROSYN) 500 MG tablet Take 1 tablet (500 mg total) by mouth 2 (two) times daily. 03/22/19   Loura Halt A, NP  cromolyn (NASALCROM) 5.2 MG/ACT nasal spray Place 1 spray into both nostrils 4 (four) times daily. 01/15/17 03/22/19  Zigmund Gottron, NP  medroxyPROGESTERone (DEPO-PROVERA) 150 MG/ML injection Inject 150 mg into the muscle every 3 (three) months. Pt is having next dose on 02/16/16  03/22/19  [provider]    Family History Family History  Problem Relation Age of Onset  . Cancer Mother     Social History Social History   Tobacco Use  . Smoking status: Current Every Day Smoker    Packs/day: 1.00    Types: Cigarettes  . Smokeless tobacco: Never Used  Substance Use Topics  . Alcohol use: Yes    Comment: occasional  . Drug use: No     Allergies   Other   Review of Systems Review of Systems Per HPI  Physical Exam Triage Vital Signs ED Triage Vitals  Enc Vitals Group     BP 06/21/20 1611  128/90     Pulse Rate 06/21/20 1611 88     Resp 06/21/20 1611 19     Temp 06/21/20 1611 (!) 102.7 F (39.3 C)     Temp Source 06/21/20 1611 Oral     SpO2 06/21/20 1611 100 %     Weight --      Height --      Head Circumference --      Peak Flow --      Pain Score 06/21/20 1609 7     Pain Loc --      Pain Edu? --      Excl. in [place]? --    No data found.  Updated Vital Signs BP 128/90 (BP Location: Right Arm)   Pulse 88   Temp (!) 102.7 F (39.3 C) (Oral)   Resp 19   SpO2 100%   Visual Acuity Right Eye Distance:   Left Eye Distance:   Bilateral Distance:    Right Eye Near:   Left Eye Near:    Bilateral Near:     Physical Exam Vitals and nursing note reviewed.  Constitutional:      Appearance: Normal appearance. She is not ill-appearing.  HENT:     Head: Atraumatic.     Right Ear: Tympanic membrane normal.     Left Ear: Tympanic membrane normal.     Nose: Rhinorrhea present.      Mouth/Throat:     Mouth: Mucous membranes are moist.     Pharynx: Oropharynx is clear. Posterior oropharyngeal erythema present.  Eyes:     Extraocular Movements: Extraocular movements intact.     Conjunctiva/sclera: Conjunctivae normal.  Cardiovascular:     Rate and Rhythm: Normal rate and regular rhythm.     Heart sounds: Normal heart sounds.  Pulmonary:     Effort: Pulmonary effort is normal. No respiratory distress.     Breath sounds: Normal breath sounds. No wheezing or rales.  Abdominal:     General: Bowel sounds are normal.     Palpations: Abdomen is soft.     Tenderness: There is no abdominal tenderness. There is no right CVA tenderness, left CVA tenderness or guarding.  Musculoskeletal:        General: Normal range of motion.     Cervical back: Normal range of motion and neck supple.  Skin:    General: Skin is warm and dry.  Neurological:     Mental Status: She is alert and oriented to person, place, and time.     Motor: No weakness.     Gait: Gait normal.  Psychiatric:        Mood and Affect: Mood normal.        Thought Content: Thought content normal.        Judgment: Judgment normal.      UC Treatments / Results  Labs (all labs ordered are listed, but only abnormal results are displayed) Labs Reviewed  SARS CORONAVIRUS 2 (TAT 6-24 HRS)  POC INFLUENZA A AND B ANTIGEN (URGENT CARE ONLY)    EKG   Radiology No results found.  Procedures Procedures (including critical care time)  Medications Ordered in UC Medications  acetaminophen (TYLENOL) 160 MG/5ML solution 650 mg (650 mg Oral Given 06/21/20 1616)    Initial Impression / Assessment and Plan / UC Course  I have reviewed the triage vital signs and the nursing notes.  Pertinent labs & imaging results that were available during my care of the patient were reviewed by me  and considered in my medical decision making (see chart for details).     Febrile in triage to 102.7 F, Tylenol given at this  time.  Otherwise overall vitals and exam reassuring today.  Suspect viral illness.  Rapid flu negative, COVID PCR pending.  Work note given, isolation instructions reviewed.  Albuterol inhaler and Phenergan DM given, discussed over-the-counter supportive measures and supportive medications.  Return for acutely worsening symptoms.  Final Clinical Impressions(s) / UC Diagnoses   Final diagnoses:  Viral URI with cough  Fever, unspecified  Seasonal allergic rhinitis due to other allergic trigger  Mild intermittent asthma with acute exacerbation   Discharge Instructions   None    ED Prescriptions    Medication Sig Dispense Auth. Provider   promethazine-dextromethorphan (PROMETHAZINE-DM) 6.25-15 MG/5ML syrup Take 5 mLs by mouth 4 (four) times daily as needed for cough. 100 mL Volney American, PA-C   albuterol (VENTOLIN HFA) 108 (90 Base) MCG/ACT inhaler Inhale 1 puff into the lungs every 6 (six) hours as needed for wheezing or shortness of breath. 18 g Volney American, [place]     PDMP not reviewed this encounter.   Volney American, PA-C 06/21/20 1806    Merrie Roof [place], [place] 06/21/20 1807

## 2020-06-21 NOTE — ED Triage Notes (Signed)
Pt in with c/o fever tmax 100.6, headache, st, cough and diarrhea that started yesterday  Pt has not taken medication for sxs

## 2020-06-22 LAB — SARS CORONAVIRUS 2 (TAT 6-24 HRS): SARS Coronavirus 2: NEGATIVE

## 2020-08-28 ENCOUNTER — Other Ambulatory Visit: Payer: Self-pay

## 2020-08-28 ENCOUNTER — Ambulatory Visit (HOSPITAL_COMMUNITY)
Admission: EM | Admit: 2020-08-28 | Discharge: 2020-08-28 | Disposition: A | Discharge status: Home / Self Care | Payer: Medicaid Other | Attending: Family Medicine | Admitting: Family Medicine

## 2020-08-28 ENCOUNTER — Encounter (HOSPITAL_COMMUNITY): Payer: Self-pay

## 2020-08-28 DIAGNOSIS — W57XXXA Bitten or stung by nonvenomous insect and other nonvenomous arthropods, initial encounter: Secondary | ICD-10-CM

## 2020-08-28 DIAGNOSIS — S40862A Insect bite (nonvenomous) of left upper arm, initial encounter: Secondary | ICD-10-CM

## 2020-08-28 DIAGNOSIS — T148XXA Other injury of unspecified body region, initial encounter: Secondary | ICD-10-CM

## 2020-08-28 MED ORDER — PREDNISONE 20 MG PO TABS: 40.0000 mg | ORAL_TABLET | Freq: Every day | ORAL | 0 refills | Status: DC

## 2020-08-28 MED ORDER — MUPIROCIN 2 % EX OINT: 1.0000 "application " | TOPICAL_OINTMENT | Freq: Two times a day (BID) | CUTANEOUS | 0 refills | Status: DC

## 2020-08-28 NOTE — ED Triage Notes (Signed)
Pt c/o possible spider bite , has fluid filled blister to left wrist, and left legs. Pt c/o itching\ and soreness. Pt placed alcohol with no relief.

## 2020-08-28 NOTE — ED Provider Notes (Signed)
[place]   [id number] 08/28/20 Arrival Time: 0920  ASSESSMENT & PLAN:  1. Blister of skin   2. Insect bite of left upper arm, initial encounter    Discussed simple wound care. Watch for s/s of infection; Mupirocin to inhibit. Prednisone for itching.  Begin: Meds ordered this encounter  Medications   mupirocin ointment (BACTROBAN) 2 %    Sig: Apply 1 application topically 2 (two) times daily.    Dispense:  22 g    Refill:  0   predniSONE (DELTASONE) 20 MG tablet    Sig: Take 2 tablets (40 mg total) by mouth daily.    Dispense:  10 tablet    Refill:  0    Follow-up Information     [facility] [employer] [facility].   Specialty: Family Medicine Why: As needed. Contact information: [address] [place] [address] [phone]         [facility].   Specialty: Urgent Care Why: If worsening or failing to improve as anticipated. Contact information: [place].   Specialty: Emergency Medicine Why: With any muscle cramps, vomiting, or abdominal pain. Contact information: [address] [id number] [place] [phone]                Reviewed expectations re: course of current medical issues. Questions answered. Outlined signs and symptoms indicating need for more acute intervention. Patient verbalized understanding. After Visit Summary given.   SUBJECTIVE:  Valerie Walter is a 40 y.o. female who presents with a skin complaint. Questions "spider bites" noted yesterday after cleaning garage. ULE. Areas with significant itching. No drainage or bleeding. Afebrile. No back or abdominal pain. Normal PO intake without n/v/d. Normal ambulation. No extremity sensation changes or weakness. No tx PTA. No specific aggravating or alleviating factors  reported.   OBJECTIVE: Vitals:   08/28/20 1015  BP: 115/77  Pulse: 69  Resp: 18  Temp: 97.8 F (36.6 C)  TempSrc: Oral  SpO2: 97%    General appearance: alert; no distress HEENT: [place]; AT Neck: supple with FROM Lungs: unlabored Extremities: no edema; moves all extremities normally Skin: warm and dry; LUE with both proximal and distal erythematous indurations with mild TTP; no active drainage or bleeding; opened blister on inner wrist; wrist with FROM; no signs of active skin infection Psychological: alert and cooperative; normal mood and affect  Allergies  Allergen Reactions   Other Hives    All nuts    Past Medical History:  Diagnosis Date   Miscarriage    Ovarian cyst    Seasonal allergies    Shingles    Thyroid disease    Trichomonas    Umbilical hernia    Social History   Socioeconomic History   Marital status: Single    Spouse name: Not on file   Number of children: Not on file   Years of education: Not on file   Highest education level: Not on file  Occupational History   Not on file  Tobacco Use   Smoking status: Every Day    Packs/day: 1.00    Types: Cigarettes   Smokeless tobacco: Never  Substance and Sexual Activity   Alcohol use: Yes    Comment: occasional   Drug use: No   Sexual activity: Yes    Birth control/protection: Injection    Comment:  depo 3 months ago  Other Topics Concern   Not on file  Social History Narrative   Not on file   Social Determinants of Health   Financial Resource Strain: Not on file  Food Insecurity: Not on file  Transportation Needs: Not on file  Physical Activity: Not on file  Stress: Not on file  Social Connections: Not on file  Intimate Partner Violence: Not on file   Family History  Problem Relation Age of Onset   Cancer Mother    Past Surgical History:  Procedure Laterality Date   [place]        Vanessa Kick,  MD 08/28/20 1313

## 2020-12-21 ENCOUNTER — Other Ambulatory Visit: Payer: Self-pay

## 2020-12-21 ENCOUNTER — Ambulatory Visit (HOSPITAL_COMMUNITY)
Admission: EM | Admit: 2020-12-21 | Discharge: 2020-12-21 | Disposition: A | Discharge status: Home / Self Care | Payer: Medicaid Other | Attending: Emergency Medicine | Admitting: Emergency Medicine

## 2020-12-21 ENCOUNTER — Encounter (HOSPITAL_COMMUNITY): Payer: Self-pay | Admitting: *Deleted

## 2020-12-21 DIAGNOSIS — Z3202 Encounter for pregnancy test, result negative: Secondary | ICD-10-CM

## 2020-12-21 DIAGNOSIS — M545 Low back pain, unspecified: Secondary | ICD-10-CM | POA: Diagnosis not present

## 2020-12-21 LAB — POCT URINALYSIS DIPSTICK, ED / UC
Glucose, UA: NEGATIVE mg/dL
Ketones, ur: NEGATIVE mg/dL
Leukocytes,Ua: NEGATIVE
Nitrite: NEGATIVE
Protein, ur: 30 mg/dL — AB
Specific Gravity, Urine: >=1.03 (ref 1.005–1.030)
Urobilinogen, UA: 0.2 mg/dL (ref 0.0–1.0)
pH: 5 (ref 5.0–8.0)

## 2020-12-21 LAB — POC URINE PREG, ED: Preg Test, Ur: NEGATIVE

## 2020-12-21 MED ORDER — METHOCARBAMOL 500 MG PO TABS: 500.0000 mg | ORAL_TABLET | Freq: Two times a day (BID) | ORAL | 0 refills | Status: DC

## 2020-12-21 MED ORDER — MELOXICAM 7.5 MG PO TABS: 7.5000 mg | ORAL_TABLET | Freq: Every day | ORAL | 1 refills | Status: AC

## 2020-12-21 NOTE — Discharge Instructions (Addendum)
Please follow up with Ob/Gyn.  Take Meloxicam and Robaxin as directed.  Please go to ED if symptoms worsen.

## 2020-12-21 NOTE — ED Triage Notes (Signed)
Pt reports Absd pain and back pain that started 2 days ago

## 2020-12-22 NOTE — ED Provider Notes (Signed)
[facility]-EMERGENCY DEPARTMENT  ____________________________________________  Time seen: Approximately 9:32 PM  I have reviewed the triage vital signs and the nursing notes.   HISTORY  Chief Complaint Abdominal Pain and Back Pain   Historian Patient     HPI Valerie Walter is a 40 y.o. female presents to the urgent care for a pregnancy test and concern for low back pain, breast tenderness and some suprapubic discomfort.  She has been afebrile at home.  No bowel or bladder incontinence or saddle anesthesia.   Past Medical History:  Diagnosis Date   Miscarriage    Ovarian cyst    Seasonal allergies    Shingles    Thyroid disease    Trichomonas    Umbilical hernia      Immunizations up to date:  Yes.     Past Medical History:  Diagnosis Date   Miscarriage    Ovarian cyst    Seasonal allergies    Shingles    Thyroid disease    Trichomonas    Umbilical hernia     Patient Active Problem List   Diagnosis Date Noted   Facial cellulitis 03/03/2016   Hypokalemia 03/03/2016   Trichomoniasis 03/03/2016   Sepsis ([place]) 03/02/2016    Past Surgical History:  Procedure Laterality Date   APPENDECTOMY     DILATION AND CURETTAGE OF UTERUS     IRRIGATION AND DEBRIDEMENT ABSCESS      Prior to Admission medications   Medication Sig Start Date End Date Taking? Authorizing Provider  meloxicam (MOBIC) 7.5 MG tablet Take 1 tablet (7.5 mg total) by mouth daily for 7 days. 12/21/20 12/28/20 Yes Vallarie Mare M, PA-C  methocarbamol (ROBAXIN) 500 MG tablet Take 1 tablet (500 mg total) by mouth 2 (two) times daily. 12/21/20  Yes Vallarie Mare M, PA-C  acetaminophen (TYLENOL) 500 MG tablet Take 1 tablet (500 mg total) by mouth every 6 (six) hours as needed. 03/22/19   Loura Halt A, NP  albuterol (VENTOLIN HFA) 108 (90 Base) MCG/ACT inhaler Inhale 1 puff into the lungs every 6 (six) hours as needed for wheezing or shortness of breath. 06/21/20   Volney American, PA-C   EUTHYROX 50 MCG tablet Take 50 mcg by mouth daily. 12/21/18   [provider]  mupirocin ointment (BACTROBAN) 2 % Apply 1 application topically 2 (two) times daily. 08/28/20   Vanessa Kick, MD  naproxen (NAPROSYN) 500 MG tablet Take 1 tablet (500 mg total) by mouth 2 (two) times daily. 03/22/19   Loura Halt A, NP  predniSONE (DELTASONE) 20 MG tablet Take 2 tablets (40 mg total) by mouth daily. 08/28/20   Vanessa Kick, MD  promethazine-dextromethorphan (PROMETHAZINE-DM) 6.25-15 MG/5ML syrup Take 5 mLs by mouth 4 (four) times daily as needed for cough. 06/21/20   Volney American, PA-C  cromolyn (NASALCROM) 5.2 MG/ACT nasal spray Place 1 spray into both nostrils 4 (four) times daily. 01/15/17 03/22/19  Zigmund Gottron, NP  medroxyPROGESTERone (DEPO-PROVERA) 150 MG/ML injection Inject 150 mg into the muscle every 3 (three) months. Pt is having next dose on 02/16/16  03/22/19  [provider]    Allergies Other  Family History  Problem Relation Age of Onset   Cancer Mother     Social History Social History   Tobacco Use   Smoking status: Every Day    Packs/day: 1.00    Types: Cigarettes   Smokeless tobacco: Never  Substance Use Topics   Alcohol use: Yes    Comment: occasional  Drug use: No     Review of Systems  Constitutional: No fever/chills Eyes:  No discharge ENT: No upper respiratory complaints. Respiratory: no cough. No SOB/ use of accessory muscles to breath Gastrointestinal:   No nausea, no vomiting.  No diarrhea.  No constipation. Musculoskeletal: Patient has low back pain.  Skin: Negative for rash, abrasions, lacerations, ecchymosis.   ____________________________________________   PHYSICAL EXAM:  VITAL SIGNS: ED Triage Vitals  Enc Vitals Group     BP 12/21/20 1325 119/77     Pulse Rate 12/21/20 1325 64     Resp 12/21/20 1325 18     Temp 12/21/20 1325 98.7 F (37.1 C)     Temp src --      SpO2 12/21/20 1325 98 %     Weight --       Height --      Head Circumference --      Peak Flow --      Pain Score 12/21/20 1323 8     Pain Loc --      Pain Edu? --      Excl. in [place]? --      Constitutional: Alert and oriented. Well appearing and in no acute distress. Eyes: Conjunctivae are normal. PERRL. EOMI. Head: Atraumatic. ENT: Cardiovascular: Normal rate, regular rhythm. Normal S1 and S2.  Good peripheral circulation. Respiratory: Normal respiratory effort without tachypnea or retractions. Lungs CTAB. Good air entry to the bases with no decreased or absent breath sounds Gastrointestinal: Bowel sounds x 4 quadrants. Soft and nontender to palpation. No guarding or rigidity. No distention. Musculoskeletal: Full range of motion to all extremities. No obvious deformities noted Neurologic:  Normal for age. No gross focal neurologic deficits are appreciated.  Skin:  Skin is warm, dry and intact. No rash noted. Psychiatric: Mood and affect are normal for age. Speech and behavior are normal.   ____________________________________________   LABS (all labs ordered are listed, but only abnormal results are displayed)  Labs Reviewed  POCT URINALYSIS DIPSTICK, ED / UC - Abnormal; Notable for the following components:      Result Value   Bilirubin Urine SMALL (*)    Hgb urine dipstick TRACE (*)    Protein, ur 30 (*)    All other components within normal limits  POC URINE PREG, ED   ____________________________________________  EKG   ____________________________________________  RADIOLOGY   No results found.  ____________________________________________    PROCEDURES  Procedure(s) performed:     Procedures     Medications - No data to display   ____________________________________________   INITIAL IMPRESSION / ASSESSMENT AND PLAN / ED COURSE  Pertinent labs & imaging results that were available during my care of the patient were reviewed by me and considered in my medical decision making (see chart  for details).     Assessment and plan:  Negative pregnancy test 39 year old female presents to the urgent care for a pregnancy test.  Pregnancy test was negative.  I recommended meloxicam and Robaxin for her low back pain and advised her to follow-up with primary care as needed.     ____________________________________________  FINAL CLINICAL IMPRESSION(S) / ED DIAGNOSES  Final diagnoses:  Negative pregnancy test      NEW MEDICATIONS STARTED DURING THIS VISIT:  ED Discharge Orders          Ordered    meloxicam (MOBIC) 7.5 MG tablet  Daily        12/21/20 1345    methocarbamol (ROBAXIN) 500 MG  tablet  2 times daily        12/21/20 1345                This chart was dictated using voice recognition software/Dragon. Despite best efforts to proofread, errors can occur which can change the meaning. Any change was purely unintentional.     Karren Cobble 12/22/20 2138    Carrie Mew, MD 12/24/20 0003

## 2021-01-28 ENCOUNTER — Other Ambulatory Visit: Payer: Self-pay

## 2021-01-28 ENCOUNTER — Emergency Department (HOSPITAL_COMMUNITY)
Admission: EM | Admit: 2021-01-28 | Discharge: 2021-01-29 | Disposition: A | Discharge status: Home / Self Care | Payer: Medicaid Other | Attending: Emergency Medicine | Admitting: Emergency Medicine

## 2021-01-28 ENCOUNTER — Encounter (HOSPITAL_COMMUNITY): Payer: Self-pay

## 2021-01-28 DIAGNOSIS — N9489 Other specified conditions associated with female genital organs and menstrual cycle: Secondary | ICD-10-CM | POA: Diagnosis not present

## 2021-01-28 DIAGNOSIS — N939 Abnormal uterine and vaginal bleeding, unspecified: Secondary | ICD-10-CM | POA: Insufficient documentation

## 2021-01-28 DIAGNOSIS — K5792 Diverticulitis of intestine, part unspecified, without perforation or abscess without bleeding: Secondary | ICD-10-CM | POA: Insufficient documentation

## 2021-01-28 DIAGNOSIS — F17210 Nicotine dependence, cigarettes, uncomplicated: Secondary | ICD-10-CM | POA: Insufficient documentation

## 2021-01-28 DIAGNOSIS — R1031 Right lower quadrant pain: Secondary | ICD-10-CM | POA: Diagnosis present

## 2021-01-28 LAB — I-STAT BETA HCG BLOOD, ED (MC, WL, AP ONLY): I-stat hCG, quantitative: <5 m[IU]/mL (ref <5)

## 2021-01-28 MED ORDER — SODIUM CHLORIDE 0.9 % IV BOLUS
1000.0000 mL | Freq: Once | INTRAVENOUS | Status: AC
  Administered 2021-01-29: 1000 mL via INTRAVENOUS

## 2021-01-28 MED ORDER — HYDROMORPHONE HCL 1 MG/ML IJ SOLN
1.0000 mg | Freq: Once | INTRAMUSCULAR | Status: AC
  Administered 2021-01-29: 1 mg via INTRAVENOUS
  Filled 2021-01-28: qty 1

## 2021-01-28 MED ORDER — KETOROLAC TROMETHAMINE 15 MG/ML IJ SOLN
15.0000 mg | Freq: Once | INTRAMUSCULAR | Status: AC
  Administered 2021-01-29: 15 mg via INTRAVENOUS
  Filled 2021-01-28: qty 1

## 2021-01-28 MED ORDER — ACETAMINOPHEN 500 MG PO TABS
1000.0000 mg | ORAL_TABLET | Freq: Once | ORAL | Status: AC
  Administered 2021-01-28: 23:00:00 1000 mg via ORAL
  Filled 2021-01-28: qty 2

## 2021-01-28 NOTE — ED Provider Notes (Signed)
[facility] Emergency Department Provider Note MRN:  [id number]  Arrival date & time: 01/29/21     Chief Complaint   Vaginal Bleeding and Abdominal Cramping   History of Present Illness   Valerie Walter is a 40 y.o. year-old female with no pertinent past medical presenting to the ED with chief complaint of vaginal bleeding and abdominal pain.  Abdominal pain with vaginal bleeding for 2 days.  Had a very light period last month.  Has been trying to get pregnant with her husband.  Pain is located in the periumbilical region as well as the right lower quadrant.  Pain is moderate to severe.  Passing clots with her bleeding, which is abnormal for her.  Denies fever, no chest pain or shortness of breath.  Symptoms constant, no exacerbating or relieving factors.  Review of Systems  A complete 10 system review of systems was obtained and all systems are negative except as noted in the HPI and PMH.   Patient's Health History    Past Medical History:  Diagnosis Date   Miscarriage    Ovarian cyst    Seasonal allergies    Shingles    Thyroid disease    Trichomonas    Umbilical hernia     Past Surgical History:  Procedure Laterality Date   APPENDECTOMY     DILATION AND CURETTAGE OF UTERUS     IRRIGATION AND DEBRIDEMENT ABSCESS      Family History  Problem Relation Age of Onset   Cancer Mother     Social History   Socioeconomic History   Marital status: Single    Spouse name: Not on file   Number of children: Not on file   Years of education: Not on file   Highest education level: Not on file  Occupational History   Not on file  Tobacco Use   Smoking status: Every Day    Packs/day: 1.00    Types: Cigarettes   Smokeless tobacco: Never  Substance and Sexual Activity   Alcohol use: Yes    Comment: occasional   Drug use: No   Sexual activity: Yes    Birth control/protection: Injection    Comment: depo 3 months ago  Other Topics Concern   Not on  file  Social History Narrative   Not on file   Social Determinants of Health   Financial Resource Strain: Not on file  Food Insecurity: Not on file  Transportation Needs: Not on file  Physical Activity: Not on file  Stress: Not on file  Social Connections: Not on file  Intimate Partner Violence: Not on file     Physical Exam   Vitals:   01/29/21 0130 01/29/21 0237  BP: 92/71 112/65  Pulse: 80 (!) 118  Resp: 16 16  Temp:    SpO2: 100% 95%    CONSTITUTIONAL: Well-appearing, in mild distress due to pain NEURO:  Alert and oriented x 3, no focal deficits EYES:  eyes equal and reactive ENT/NECK:  no LAD, no JVD CARDIO: Regular rate, well-perfused, normal S1 and S2 PULM:  CTAB no wheezing or rhonchi GI/GU:  normal bowel sounds, non-distended, tender to palpation to the periumbilical region with palpable ventral hernia, tender palpation to the right lower quadrant MSK/SPINE:  No gross deformities, no edema SKIN:  no rash, atraumatic PSYCH:  Appropriate speech and behavior  *Additional and/or pertinent findings included in MDM below  Diagnostic and Interventional Summary    EKG Interpretation  Date/Time:    Ventricular  Rate:    PR Interval:    QRS Duration:   QT Interval:    QTC Calculation:   R Axis:     Text Interpretation:         Labs Reviewed  COMPREHENSIVE METABOLIC PANEL - Abnormal; Notable for the following components:      Result Value   Potassium 3.4 (*)    CO2 21 (*)    Glucose, Bld 129 (*)    Calcium 8.6 (*)    All other components within normal limits  CBC - Abnormal; Notable for the following components:   WBC 14.4 (*)    All other components within normal limits  URINALYSIS, ROUTINE W REFLEX MICROSCOPIC - Abnormal; Notable for the following components:   APPearance HAZY (*)    Hgb urine dipstick LARGE (*)    RBC / HPF >50 (*)    All other components within normal limits  LIPASE, BLOOD  I-STAT BETA HCG BLOOD, ED ([facility], [facility], [facility] ONLY)    CT  ABDOMEN PELVIS W CONTRAST  Final Result      Medications  amoxicillin-clavulanate (AUGMENTIN) 875-125 MG per tablet 1 tablet (has no administration in time range)  acetaminophen (TYLENOL) tablet 1,000 mg (1,000 mg Oral Given 01/28/21 2318)  ketorolac (TORADOL) 15 MG/ML injection 15 mg (15 mg Intravenous Given 01/29/21 0021)  HYDROmorphone (DILAUDID) injection 1 mg (1 mg Intravenous Given 01/29/21 0022)  sodium chloride 0.9 % bolus 1,000 mL (0 mLs Intravenous Stopped 01/29/21 0153)  ondansetron (ZOFRAN) injection 4 mg (4 mg Intravenous Given 01/29/21 0032)  HYDROmorphone (DILAUDID) injection 1 mg (1 mg Intravenous Given 01/29/21 0213)  iohexol (OMNIPAQUE) 350 MG/ML injection 80 mL (80 mLs Intravenous Contrast Given 01/29/21 0201)     Procedures  /  Critical Care Procedures  ED Course and Medical Decision Making  I have reviewed the triage vital signs, the nursing notes, and pertinent available records from the EMR.  Listed above are laboratory and imaging tests that I personally ordered, reviewed, and interpreted and then considered in my medical decision making (see below for details).  Differential diagnosis includes ectopic pregnancy, miscarriage, incarcerated hernia, appendicitis, menstrual cycle related discomfort.  Awaiting labs, hCG.     hCG is negative, patient with continued significant pain, CT obtained which reveals a diverticulitis.  Seems that her vaginal bleeding is more of a coincidence in the setting of her menstrual cycle.  Patient's pain is better controlled, vitals normal, no complicating features of the diverticulitis, patient is appropriate for discharge.  Barth Kirks. Sedonia Small, [place] [email or web address]  Final Clinical Impressions(s) / ED Diagnoses     ICD-10-CM   1. Vaginal bleeding  N93.9     2. Diverticulitis  K57.92       ED Discharge Orders          Ordered    naproxen (NAPROSYN) 500 MG tablet  2  times daily        01/29/21 0306    amoxicillin-clavulanate (AUGMENTIN) 875-125 MG tablet  Every 12 hours        01/29/21 0306             Discharge Instructions Discussed with and Provided to Patient:     Discharge Instructions      You were evaluated in the Emergency Department and after careful evaluation, we did not find any emergent condition requiring admission or further testing in the hospital.  Your exam/testing today is overall reassuring.  Symptoms seem  to be due to diverticulitis.  Please take the Augmentin antibiotic as directed.  Use the Naprosyn anti-inflammatory for pain.  Please return to the Emergency Department if you experience any worsening of your condition.   Thank you for allowing [place] to be a part of your care.        Maudie Flakes, MD 01/29/21 [phone]

## 2021-01-28 NOTE — ED Triage Notes (Signed)
Pt reports with abdominal cramping and heavy vaginal bleeding with clots x 2 days. Pt states that her cycles are not usually this heavy.

## 2021-01-29 ENCOUNTER — Emergency Department (HOSPITAL_COMMUNITY): Payer: Medicaid Other

## 2021-01-29 ENCOUNTER — Encounter (HOSPITAL_COMMUNITY): Payer: Self-pay

## 2021-01-29 LAB — URINALYSIS, ROUTINE W REFLEX MICROSCOPIC
Bacteria, UA: NONE SEEN
Bilirubin Urine: NEGATIVE
Glucose, UA: NEGATIVE mg/dL
Ketones, ur: NEGATIVE mg/dL
Leukocytes,Ua: NEGATIVE
Nitrite: NEGATIVE
Protein, ur: NEGATIVE mg/dL
RBC / HPF: >50 RBC/hpf — ABNORMAL HIGH (ref 0–5)
Specific Gravity, Urine: 1.021 (ref 1.005–1.030)
pH: 5 (ref 5.0–8.0)

## 2021-01-29 LAB — COMPREHENSIVE METABOLIC PANEL
ALT: 20 U/L (ref 0–44)
AST: 18 U/L (ref 15–41)
Albumin: 4 g/dL (ref 3.5–5.0)
Alkaline Phosphatase: 61 U/L (ref 38–126)
Anion gap: 8 (ref 5–15)
BUN: 9 mg/dL (ref 6–20)
CO2: 21 mmol/L — ABNORMAL LOW (ref 22–32)
Calcium: 8.6 mg/dL — ABNORMAL LOW (ref 8.9–10.3)
Chloride: 106 mmol/L (ref 98–111)
Creatinine, Ser: 0.8 mg/dL (ref 0.44–1.00)
GFR, Estimated: >60 mL/min (ref >60)
Glucose, Bld: 129 mg/dL — ABNORMAL HIGH (ref 70–99)
Potassium: 3.4 mmol/L — ABNORMAL LOW (ref 3.5–5.1)
Sodium: 135 mmol/L (ref 135–145)
Total Bilirubin: 0.7 mg/dL (ref 0.3–1.2)
Total Protein: 7.8 g/dL (ref 6.5–8.1)

## 2021-01-29 LAB — CBC
HCT: 41.3 % (ref 36.0–46.0)
Hemoglobin: 13.6 g/dL (ref 12.0–15.0)
MCH: 28.8 pg (ref 26.0–34.0)
MCHC: 32.9 g/dL (ref 30.0–36.0)
MCV: 87.3 fL (ref 80.0–100.0)
Platelets: 329 10*3/uL (ref 150–400)
RBC: 4.73 MIL/uL (ref 3.87–5.11)
RDW: 14 % (ref 11.5–15.5)
WBC: 14.4 10*3/uL — ABNORMAL HIGH (ref 4.0–10.5)
nRBC: 0 % (ref 0.0–0.2)

## 2021-01-29 LAB — LIPASE, BLOOD: Lipase: 27 U/L (ref 11–51)

## 2021-01-29 MED ORDER — ONDANSETRON HCL 4 MG/2ML IJ SOLN
4.0000 mg | Freq: Once | INTRAMUSCULAR | Status: AC
  Administered 2021-01-29: 01:00:00 4 mg via INTRAVENOUS
  Filled 2021-01-29: qty 2

## 2021-01-29 MED ORDER — AMOXICILLIN-POT CLAVULANATE 875-125 MG PO TABS
1.0000 | ORAL_TABLET | Freq: Once | ORAL | Status: AC
  Administered 2021-01-29: 03:00:00 1 via ORAL
  Filled 2021-01-29: qty 1

## 2021-01-29 MED ORDER — AMOXICILLIN-POT CLAVULANATE 875-125 MG PO TABS: 1.0000 | ORAL_TABLET | Freq: Two times a day (BID) | ORAL | 0 refills | Status: AC

## 2021-01-29 MED ORDER — OXYCODONE HCL 5 MG PO TABS
5.0000 mg | ORAL_TABLET | Freq: Once | ORAL | Status: AC
  Administered 2021-01-29: 03:00:00 5 mg via ORAL
  Filled 2021-01-29 (×2): qty 1

## 2021-01-29 MED ORDER — IOHEXOL 350 MG/ML SOLN
80.0000 mL | Freq: Once | INTRAVENOUS | Status: AC | PRN
  Administered 2021-01-29: 02:00:00 80 mL via INTRAVENOUS

## 2021-01-29 MED ORDER — NAPROXEN 500 MG PO TABS: 500.0000 mg | ORAL_TABLET | Freq: Two times a day (BID) | ORAL | 0 refills | Status: DC

## 2021-01-29 MED ORDER — ONDANSETRON 4 MG PO TBDP
4.0000 mg | ORAL_TABLET | Freq: Once | ORAL | Status: AC
  Administered 2021-01-29: 03:00:00 4 mg via ORAL
  Filled 2021-01-29 (×2): qty 1

## 2021-01-29 MED ORDER — HYDROMORPHONE HCL 1 MG/ML IJ SOLN
1.0000 mg | Freq: Once | INTRAMUSCULAR | Status: AC
Start: 2021-01-29 — End: 2021-01-29
  Administered 2021-01-29: 02:00:00 1 mg via INTRAVENOUS
  Filled 2021-01-29: qty 1

## 2021-01-29 NOTE — Discharge Instructions (Signed)
You were evaluated in the Emergency Department and after careful evaluation, we did not find any emergent condition requiring admission or further testing in the hospital.  Your exam/testing today is overall reassuring.  Symptoms seem to be due to diverticulitis.  Please take the Augmentin antibiotic as directed.  Use the Naprosyn anti-inflammatory for pain.  Please return to the Emergency Department if you experience any worsening of your condition.   Thank you for allowing [place] to be a part of your care.

## 2021-03-17 ENCOUNTER — Ambulatory Visit (HOSPITAL_COMMUNITY): Payer: Medicaid Other

## 2021-03-17 ENCOUNTER — Ambulatory Visit (HOSPITAL_COMMUNITY)
Admission: EM | Admit: 2021-03-17 | Discharge: 2021-03-17 | Disposition: A | Discharge status: Home / Self Care | Payer: Medicaid Other | Attending: Emergency Medicine | Admitting: Emergency Medicine

## 2021-03-17 ENCOUNTER — Ambulatory Visit (INDEPENDENT_AMBULATORY_CARE_PROVIDER_SITE_OTHER): Payer: Medicaid Other

## 2021-03-17 ENCOUNTER — Encounter (HOSPITAL_COMMUNITY): Payer: Self-pay | Admitting: Emergency Medicine

## 2021-03-17 ENCOUNTER — Other Ambulatory Visit: Payer: Self-pay

## 2021-03-17 DIAGNOSIS — M79671 Pain in right foot: Secondary | ICD-10-CM | POA: Diagnosis present

## 2021-03-17 DIAGNOSIS — M7989 Other specified soft tissue disorders: Secondary | ICD-10-CM | POA: Insufficient documentation

## 2021-03-17 LAB — CBC WITH DIFFERENTIAL/PLATELET
Abs Immature Granulocytes: 0.02 10*3/uL (ref 0.00–0.07)
Basophils Absolute: 0.1 10*3/uL (ref 0.0–0.1)
Basophils Relative: 1 %
Eosinophils Absolute: 0.2 10*3/uL (ref 0.0–0.5)
Eosinophils Relative: 2 %
HCT: 41.5 % (ref 36.0–46.0)
Hemoglobin: 13.7 g/dL (ref 12.0–15.0)
Immature Granulocytes: 0 %
Lymphocytes Relative: 40 %
Lymphs Abs: 3.5 10*3/uL (ref 0.7–4.0)
MCH: 29 pg (ref 26.0–34.0)
MCHC: 33 g/dL (ref 30.0–36.0)
MCV: 87.9 fL (ref 80.0–100.0)
Monocytes Absolute: 0.5 10*3/uL (ref 0.1–1.0)
Monocytes Relative: 6 %
Neutro Abs: 4.6 10*3/uL (ref 1.7–7.7)
Neutrophils Relative %: 51 %
Platelets: 367 10*3/uL (ref 150–400)
RBC: 4.72 MIL/uL (ref 3.87–5.11)
RDW: 14.7 % (ref 11.5–15.5)
WBC: 8.8 10*3/uL (ref 4.0–10.5)
nRBC: 0 % (ref 0.0–0.2)

## 2021-03-17 LAB — URIC ACID: Uric Acid, Serum: 5.3 mg/dL (ref 2.5–7.1)

## 2021-03-17 MED ORDER — PREDNISONE 20 MG PO TABS: 20.0000 mg | ORAL_TABLET | Freq: Every day | ORAL | 0 refills | Status: AC

## 2021-03-17 NOTE — ED Provider Notes (Signed)
[place]    CSN: [id number] Arrival date & time: 03/17/21  1241    HISTORY   Chief Complaint  Patient presents with   Foot Pain   HPI Valerie Walter is a 41 y.o. female. Pt reports right foot pain since Thursday.  Pt states foot is swollen and pain when walking. Denies known injury to right foot.  Patient states she is never had a pain like this before.  Patient states that few days ago, she worked 8 hours in a pair of rain boots that did not have any arch support, thinks this may be contributing to her pain.  Patient denies history of gout, denies lesion on the bottom of her foot.  Patient denies fever, aches, chills.  Patient denies history of clotting disorder or blood clots.  The history is provided by the patient.  Past Medical History:  Diagnosis Date   Miscarriage    Ovarian cyst    Seasonal allergies    Shingles    Thyroid disease    Trichomonas    Umbilical hernia    Patient Active Problem List   Diagnosis Date Noted   Facial cellulitis 03/03/2016   Hypokalemia 03/03/2016   Trichomoniasis 03/03/2016   Sepsis ([place]) 03/02/2016   Past Surgical History:  Procedure Laterality Date   APPENDECTOMY     DILATION AND CURETTAGE OF UTERUS     IRRIGATION AND DEBRIDEMENT ABSCESS     OB History     Gravida  5   Para  2   Term  2   Preterm  0   AB  3   Living  2      SAB  2   IAB  1   Ectopic  0   Multiple  0   Live Births  2          Home Medications    Prior to Admission medications   Medication Sig Start Date End Date Taking? Authorizing Provider  acetaminophen (TYLENOL) 500 MG tablet Take 1 tablet (500 mg total) by mouth every 6 (six) hours as needed. 03/22/19   Loura Halt A, NP  albuterol (VENTOLIN HFA) 108 (90 Base) MCG/ACT inhaler Inhale 1 puff into the lungs every 6 (six) hours as needed for wheezing or shortness of breath. 06/21/20   Volney American, PA-C  EUTHYROX 50 MCG tablet Take 50 mcg by mouth daily. 12/21/18    [provider]  methocarbamol (ROBAXIN) 500 MG tablet Take 1 tablet (500 mg total) by mouth 2 (two) times daily. 12/21/20   Lannie Fields, PA-C  mupirocin ointment (BACTROBAN) 2 % Apply 1 application topically 2 (two) times daily. 08/28/20   Vanessa Kick, MD  naproxen (NAPROSYN) 500 MG tablet Take 1 tablet (500 mg total) by mouth 2 (two) times daily. 01/29/21   Maudie Flakes, MD  predniSONE (DELTASONE) 20 MG tablet Take 2 tablets (40 mg total) by mouth daily. 08/28/20   Vanessa Kick, MD  promethazine-dextromethorphan (PROMETHAZINE-DM) 6.25-15 MG/5ML syrup Take 5 mLs by mouth 4 (four) times daily as needed for cough. 06/21/20   Volney American, PA-C  cromolyn (NASALCROM) 5.2 MG/ACT nasal spray Place 1 spray into both nostrils 4 (four) times daily. 01/15/17 03/22/19  Zigmund Gottron, NP  medroxyPROGESTERone (DEPO-PROVERA) 150 MG/ML injection Inject 150 mg into the muscle every 3 (three) months. Pt is having next dose on 02/16/16  03/22/19  [provider]    Family History Family History  Problem Relation Age  of Onset   Cancer Mother    Social History Social History   Tobacco Use   Smoking status: Every Day    Packs/day: 1.00    Types: Cigarettes   Smokeless tobacco: Never  Substance Use Topics   Alcohol use: Yes    Comment: occasional   Drug use: No   Allergies   Other  Review of Systems Review of Systems Pertinent findings noted in history of present illness.   Physical Exam Triage Vital Signs ED Triage Vitals  Enc Vitals Group     BP 12/08/20 0827 (!) 147/82     Pulse Rate 12/08/20 0827 72     Resp 12/08/20 0827 18     Temp 12/08/20 0827 98.3 F (36.8 C)     Temp Source 12/08/20 0827 Oral     SpO2 12/08/20 0827 98 %     Weight --      Height --      Head Circumference --      Peak Flow --      Pain Score 12/08/20 0826 5     Pain Loc --      Pain Edu? --      Excl. in [place]? --   No data found.  Updated Vital Signs BP 139/85 (BP Location:  Right Arm)    Pulse 69    Temp 98.2 F (36.8 C) (Oral)    Resp 16    Ht 5\' 3"  (1.6 m)    Wt 190 lb 0.6 oz (86.2 kg)    SpO2 99%    BMI 33.66 kg/m   Physical Exam Vitals and nursing note reviewed.  Constitutional:      General: She is not in acute distress.    Appearance: Normal appearance. She is not ill-appearing.  HENT:     Head: Normocephalic and atraumatic.  Eyes:     General: Lids are normal.        Right eye: No discharge.        Left eye: No discharge.     Extraocular Movements: Extraocular movements intact.     Conjunctiva/sclera: Conjunctivae normal.     Right eye: Right conjunctiva is not injected.     Left eye: Left conjunctiva is not injected.  Neck:     Trachea: Trachea and phonation normal.  Cardiovascular:     Rate and Rhythm: Normal rate and regular rhythm.     Pulses: Normal pulses.     Heart sounds: Normal heart sounds. No murmur heard.   No friction rub. No gallop.  Pulmonary:     Effort: Pulmonary effort is normal. No accessory muscle usage, prolonged expiration or respiratory distress.     Breath sounds: Normal breath sounds. No stridor, decreased air movement or transmitted upper airway sounds. No decreased breath sounds, wheezing, rhonchi or rales.  Chest:     Chest wall: No tenderness.  Musculoskeletal:        General: Swelling and tenderness present. No deformity or signs of injury. Normal range of motion.     Cervical back: Normal range of motion and neck supple. Normal range of motion.     Right lower leg: No edema.     Left lower leg: No edema.  Lymphadenopathy:     Cervical: No cervical adenopathy.  Skin:    General: Skin is warm and dry.     Findings: No erythema or rash.  Neurological:     General: No focal deficit present.     Mental  Status: She is alert and oriented to person, place, and time.  Psychiatric:        Mood and Affect: Mood normal.        Behavior: Behavior normal.    Visual Acuity Right Eye Distance:   Left Eye  Distance:   Bilateral Distance:    Right Eye Near:   Left Eye Near:    Bilateral Near:     UC Couse / Diagnostics / Procedures:    EKG  Radiology DG Foot Complete Right  Result Date: 03/17/2021 CLINICAL DATA:  Foot pain and swelling on right. EXAM: RIGHT FOOT COMPLETE - 3+ VIEW COMPARISON:  None. FINDINGS: Mild soft tissue swelling identified. There is no evidence of fracture or dislocation. There is no evidence of arthropathy or other focal bone abnormality. IMPRESSION: No acute bone abnormality. Electronically Signed   By: Kerby Moors M.D.   On: 03/17/2021 14:51    Procedures Procedures (including critical care time)  UC Diagnoses / Final Clinical Impressions(s)   I have reviewed the triage vital signs and the nursing notes.  Pertinent labs & imaging results that were available during my care of the patient were reviewed by me and considered in my medical decision making (see chart for details).    Final diagnoses:  Foot pain, right  Swelling of right foot   Patient advised that x-ray showed no signs of acute injury but does show soft tissue swelling.  Obtained uric acid and CBC with differential, notify patient's of results once received.  Patient advised to begin prednisone now for relief of inflammation and pain.  Return precautions advised.  We will treat for either gout or infection as needed based on results.  ED Prescriptions     Medication Sig Dispense Auth. Provider   predniSONE (DELTASONE) 20 MG tablet Take 1 tablet (20 mg total) by mouth daily with breakfast for 5 days. 5 tablet Lynden Oxford Scales, PA-C      PDMP not reviewed this encounter.  Pending results:  Labs Reviewed  URIC ACID  CBC WITH DIFFERENTIAL/PLATELET    Medications Ordered in UC: Medications - No data to display  Disposition Upon Discharge:  Condition: stable for discharge home Home: take medications as prescribed; routine discharge instructions as discussed; follow up as  advised.  Patient presented with an acute illness with associated systemic symptoms and significant discomfort requiring urgent management. In my opinion, this is a condition that a prudent lay person (someone who possesses an average knowledge of health and medicine) may potentially expect to result in complications if not addressed urgently such as respiratory distress, impairment of bodily function or dysfunction of bodily organs.   Routine symptom specific, illness specific and/or disease specific instructions were discussed with the patient and/or caregiver at length.   As such, the patient has been evaluated and assessed, work-up was performed and treatment was provided in alignment with urgent care protocols and evidence based medicine.  Patient/parent/caregiver has been advised that the patient may require follow up for further testing and treatment if the symptoms continue in spite of treatment, as clinically indicated and appropriate.  If the patient was tested for COVID-19, Influenza and/or RSV, then the patient/parent/guardian was advised to isolate at home pending the results of his/her diagnostic coronavirus test and potentially longer if theyre positive. I have also advised pt that if his/her COVID-19 test returns positive, it's recommended to self-isolate for at least 10 days after symptoms first appeared AND until fever-free for 24 hours without fever  reducer AND other symptoms have improved or resolved. Discussed self-isolation recommendations as well as instructions for household member/close contacts as per the [facility] and [place] DHHS, and also gave patient the [facility] packet with this information.  Patient/parent/caregiver has been advised to return to the [facility] or PCP in 3-5 days if no better; to PCP or the Emergency Department if new signs and symptoms develop, or if the current signs or symptoms continue to change or worsen for further workup, evaluation and treatment as clinically indicated  and appropriate  The patient will follow up with their current PCP if and as advised. If the patient does not currently have a PCP we will assist them in obtaining one.   The patient may need specialty follow up if the symptoms continue, in spite of conservative treatment and management, for further workup, evaluation, consultation and treatment as clinically indicated and appropriate.   Patient/parent/caregiver verbalized understanding and agreement of plan as discussed.  All questions were addressed during visit.  Please see discharge instructions below for further details of plan.  Discharge Instructions:   Discharge Instructions      The pain and swelling in your foot is not related to any trauma to your bones, your x-ray was completely normal.  Blood work was performed today to rule out infection and gout.  I have provided you with a prescription for 5 days of prednisone to reduce the inflammation and hopefully give you relief of your pain.    During these 5 days, we will have the results of your blood work and a better idea of what is causing your pain and swelling.  Based on those results, we will adjust your treatment as needed.  You will be contacted by phone.  Please rest as much as possible, keep your foot elevated when you are not walking and try to walk no more than necessary.  You may also consider applying ice to your foot as well.  Thank you for visiting urgent care today.  I appreciate the opportunity to participate in your care.    This office note has been dictated using Museum/gallery curator.  Unfortunately, and despite my best efforts, this method of dictation can sometimes lead to occasional typographical or grammatical errors.  I apologize in advance if this occurs.     Lynden Oxford Scales, PA-C 03/17/21 [phone]

## 2021-03-17 NOTE — ED Triage Notes (Signed)
Pt reports right foot pain since Thursday.  Pt states foot is swollen and pain when walking. Denies any obvious injuries

## 2021-03-17 NOTE — Discharge Instructions (Addendum)
The pain and swelling in your foot is not related to any trauma to your bones, your x-ray was completely normal.  Blood work was performed today to rule out infection and gout.  I have provided you with a prescription for 5 days of prednisone to reduce the inflammation and hopefully give you relief of your pain.    During these 5 days, we will have the results of your blood work and a better idea of what is causing your pain and swelling.  Based on those results, we will adjust your treatment as needed.  You will be contacted by phone.  Please rest as much as possible, keep your foot elevated when you are not walking and try to walk no more than necessary.  You may also consider applying ice to your foot as well.  Thank you for visiting urgent care today.  I appreciate the opportunity to participate in your care.

## 2021-04-30 ENCOUNTER — Ambulatory Visit (HOSPITAL_COMMUNITY)
Admission: EM | Admit: 2021-04-30 | Discharge: 2021-04-30 | Disposition: A | Discharge status: Home / Self Care | Payer: Medicaid Other | Attending: Nurse Practitioner | Admitting: Nurse Practitioner

## 2021-04-30 ENCOUNTER — Encounter (HOSPITAL_COMMUNITY): Payer: Self-pay

## 2021-04-30 ENCOUNTER — Other Ambulatory Visit: Payer: Self-pay

## 2021-04-30 DIAGNOSIS — Z8639 Personal history of other endocrine, nutritional and metabolic disease: Secondary | ICD-10-CM

## 2021-04-30 DIAGNOSIS — M545 Low back pain, unspecified: Secondary | ICD-10-CM

## 2021-04-30 MED ORDER — METHOCARBAMOL 500 MG PO TABS: 500.0000 mg | ORAL_TABLET | Freq: Three times a day (TID) | ORAL | 0 refills | Status: DC | PRN

## 2021-04-30 MED ORDER — KETOROLAC TROMETHAMINE 30 MG/ML IJ SOLN
30.0000 mg | Freq: Once | INTRAMUSCULAR | Status: AC
  Administered 2021-04-30: 30 mg via INTRAMUSCULAR

## 2021-04-30 MED ORDER — EUTHYROX 50 MCG PO TABS: 50.0000 ug | ORAL_TABLET | Freq: Every day | ORAL | 0 refills | Status: DC

## 2021-04-30 MED ORDER — KETOROLAC TROMETHAMINE 30 MG/ML IJ SOLN
INTRAMUSCULAR | Status: AC
  Filled 2021-04-30: qty 1

## 2021-04-30 NOTE — Discharge Instructions (Addendum)
-   today we have given you an injection for Toradol for your back pain ?- Please do not take anymore NSAIDs today ?- you can also use Robaxin (muscle relaxant) for pain ?- Call the sports medicine number provided if your back pain persists ?- Please also resume the previously prescribed dose of thyroid medication - this has been sent to your pharmacy ?

## 2021-04-30 NOTE — ED Provider Notes (Signed)
?[facility] ? ? ? ?CSN: [id number] ?Arrival date & time: 04/30/21  1736 ? ? ?  ? ?History   ?Chief Complaint ?Chief Complaint  ?Patient presents with  ? Back Pain  ? ? ?HPI ?Valerie Walter is a 41 y.o. female.  ? 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  She did not fall catching her client but did develop severe back pain shortly after.   She denies fevers, burning with urination, increasing urinary frequency, blood in her urine, saddle anesthesia, numbness or tinging in her toes, and shooting back down her legs.  She reports history of back pain, no surgeries.  She has tried ibuprofen, Tylenol, and narcotic pain medication without relief of symptoms.  She is requesting "steroid shot."  ? ?She is also requesting a refill of thyroid medication.  She reports she was told in the past that she needed to take thyroid medication "for the rest of my life."  She reports she has been out of medication for months and is really starting to be affected by her decreased energy levels and feeling down.  She has an upcoming appointment with a new primary care provider in the next month or so.   ? ? ?Past Medical History:  ?Diagnosis Date  ? Miscarriage   ? Ovarian cyst   ? Seasonal allergies   ? Shingles   ? Thyroid disease   ? Trichomonas   ? Umbilical hernia   ? ? ?Patient Active Problem List  ? Diagnosis Date Noted  ? Facial cellulitis 03/03/2016  ? Hypokalemia 03/03/2016  ? Trichomoniasis 03/03/2016  ? Sepsis ([place]) 03/02/2016  ? ? ?Past Surgical History:  ?Procedure Laterality Date  ? APPENDECTOMY    ? DILATION AND CURETTAGE OF UTERUS    ? IRRIGATION AND DEBRIDEMENT ABSCESS    ? ? ?OB History   ? ? Gravida  ?5  ? Para  ?2  ? Term  ?2  ? Preterm  ?0  ? AB  ?3  ? Living  ?2  ?  ? ? SAB  ?2  ? IAB  ?1  ? Ectopic  ?0  ? Multiple  ?0  ? Live Births  ?2  ?   ?  ?  ? ? ? ?Home Medications   ? ?Prior to Admission medications   ?Medication Sig Start Date  End Date Taking? Authorizing Provider  ?acetaminophen (TYLENOL) 500 MG tablet Take 1 tablet (500 mg total) by mouth every 6 (six) hours as needed. 03/22/19   Loura Halt A, NP  ?albuterol (VENTOLIN HFA) 108 (90 Base) MCG/ACT inhaler Inhale 1 puff into the lungs every 6 (six) hours as needed for wheezing or shortness of breath. ?Patient not taking: Reported on 04/30/2021 06/21/20   Volney American, PA-C  ?EUTHYROX 50 MCG tablet Take 1 tablet (50 mcg total) by mouth daily. 04/30/21   Eulogio Bear, NP  ?methocarbamol (ROBAXIN) 500 MG tablet Take 1 tablet (500 mg total) by mouth every 8 (eight) hours as needed for muscle spasms. Do not take while driving or operating heavy machinery. 04/30/21   Eulogio Bear, NP  ?mupirocin ointment (BACTROBAN) 2 % Apply 1 application topically 2 (two) times daily. ?Patient not taking: Reported on 04/30/2021 08/28/20   Vanessa Kick, MD  ?naproxen (NAPROSYN) 500 MG tablet Take 1 tablet (500 mg total) by mouth 2 (two) times daily. ?Patient not taking: Reported on 04/30/2021 01/29/21  Maudie Flakes, MD  ?promethazine-dextromethorphan (PROMETHAZINE-DM) 6.25-15 MG/5ML syrup Take 5 mLs by mouth 4 (four) times daily as needed for cough. ?Patient not taking: Reported on 04/30/2021 06/21/20   Volney American, PA-C  ?cromolyn (NASALCROM) 5.2 MG/ACT nasal spray Place 1 spray into both nostrils 4 (four) times daily. 01/15/17 03/22/19  Zigmund Gottron, NP  ?medroxyPROGESTERone (DEPO-PROVERA) 150 MG/ML injection Inject 150 mg into the muscle every 3 (three) months. Pt is having next dose on 02/16/16  03/22/19  [provider]  ? ? ?Family History ?Family History  ?Problem Relation Age of Onset  ? Cancer Mother   ? ? ?Social History ?Social History  ? ?Tobacco Use  ? Smoking status: Every Day  ?  Packs/day: 1.00  ?  Types: Cigarettes  ? Smokeless tobacco: Never  ?Substance Use Topics  ? Alcohol use: Yes  ?  Comment: occasional  ? Drug use: No  ? ? ? ?Allergies    ?Other ? ? ?Review of Systems ?Review of Systems ?Per HPI ? ?Physical Exam ?Triage Vital Signs ?ED Triage Vitals  ?Enc Vitals Group  ?   BP 04/30/21 1853 118/81  ?   Pulse Rate 04/30/21 1853 (!) 59  ?   Resp 04/30/21 1853 14  ?   Temp 04/30/21 1853 98.7 ?F (37.1 ?C)  ?   Temp Source 04/30/21 1853 Oral  ?   SpO2 04/30/21 1853 97 %  ?   Weight --   ?   Height --   ?   Head Circumference --   ?   Peak Flow --   ?   Pain Score 04/30/21 1855 9  ?   Pain Loc --   ?   Pain Edu? --   ?   Excl. in [place]? --   ? ?No data found. ? ?Updated Vital Signs ?BP 118/81 (BP Location: Right Arm)   Pulse (!) 59   Temp 98.7 ?F (37.1 ?C) (Oral)   Resp 14   SpO2 97%  ? ?Visual Acuity ?Right Eye Distance:   ?Left Eye Distance:   ?Bilateral Distance:   ? ?Right Eye Near:   ?Left Eye Near:    ?Bilateral Near:    ? ?Physical Exam ?Vitals and nursing note reviewed.  ?Constitutional:   ?   General: She is not in acute distress. ?   Appearance: Normal appearance. She is not toxic-appearing.  ?Pulmonary:  ?   Effort: Pulmonary effort is normal. No respiratory distress.  ?   Breath sounds: Normal breath sounds. No wheezing, rhonchi or rales.  ?Musculoskeletal:  ?   Lumbar back: Tenderness and bony tenderness present. No swelling or deformity. Negative right straight leg raise test and negative left straight leg raise test.  ?     Back: ? ?   Right lower leg: No edema.  ?   Left lower leg: No edema.  ?   Comments: Ttp in the area marked  ?Skin: ?   General: Skin is warm and dry.  ?   Capillary Refill: Capillary refill takes less than 2 seconds.  ?   Coloration: Skin is not jaundiced or pale.  ?   Findings: No erythema.  ?Neurological:  ?   Mental Status: She is alert and oriented to person, place, and time.  ?Psychiatric:     ?   Mood and Affect: Mood normal.     ?   Behavior: Behavior normal.  ? ? ? ?UC Treatments / Results  ?Labs ?(all  labs ordered are listed, but only abnormal results are displayed) ?Labs Reviewed - No data to  display ? ?EKG ? ? ?Radiology ?No results found. ? ?Procedures ?Procedures (including critical care time) ? ?Medications Ordered in UC ?Medications  ?ketorolac (TORADOL) 30 MG/ML injection 30 mg (has no administration in time range)  ? ? ?Initial Impression / Assessment and Plan / UC Course  ?I have reviewed the triage vital signs and the nursing notes. ? ?Pertinent labs & imaging results that were available during my care of the patient were reviewed by me and considered in my medical decision making (see chart for details). ? ?  ?Treat acute back pain with Toradol 30 mg IM today.  Can use Robaxin 500 mg every 8 hours as needed.  No fall or red flags today; imaging not indicated.  Follow up with PCP if symptoms do not improve or sports medicine clinic for formal PT.   Note given for work. ? ?Refill of thyroid medication sent to pharmacy - follow up labs with PCP in next month. ?Final Clinical Impressions(s) / UC Diagnoses  ? ?Final diagnoses:  ?Acute bilateral low back pain without sciatica  ?History of hypothyroidism  ? ? ? ?Discharge Instructions   ? ?  ?- today we have given you an injection for Toradol for your back pain ?- Please do not take anymore NSAIDs today ?- you can also use Robaxin (muscle relaxant) for pain ?- Call the sports medicine number provided if your back pain persists ?- Please also resume the previously prescribed dose of thyroid medication - this has been sent to your pharmacy ? ? ? ? ?ED Prescriptions   ? ? Medication Sig Dispense Auth. Provider  ? methocarbamol (ROBAXIN) 500 MG tablet Take 1 tablet (500 mg total) by mouth every 8 (eight) hours as needed for muscle spasms. Do not take while driving or operating heavy machinery. 20 tablet Noemi Chapel A, NP  ? EUTHYROX 50 MCG tablet Take 1 tablet (50 mcg total) by mouth daily. 30 tablet Eulogio Bear, NP  ? ?  ? ?PDMP not reviewed this encounter. ?  ?Eulogio Bear, NP ?04/30/21 1929 ? ?

## 2021-04-30 NOTE — ED Triage Notes (Signed)
Pt presents with back pain that began Friday. 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  ?

## 2021-11-17 ENCOUNTER — Other Ambulatory Visit: Payer: Self-pay

## 2021-11-17 ENCOUNTER — Emergency Department (HOSPITAL_COMMUNITY): Payer: Medicaid Other

## 2021-11-17 ENCOUNTER — Encounter (HOSPITAL_COMMUNITY): Payer: Self-pay

## 2021-11-17 ENCOUNTER — Emergency Department (HOSPITAL_COMMUNITY)
Admission: EM | Admit: 2021-11-17 | Discharge: 2021-11-18 | Discharge status: Against Medical Advice | Payer: Medicaid Other | Attending: Physician Assistant | Admitting: Physician Assistant

## 2021-11-17 DIAGNOSIS — R197 Diarrhea, unspecified: Secondary | ICD-10-CM | POA: Diagnosis not present

## 2021-11-17 DIAGNOSIS — R109 Unspecified abdominal pain: Secondary | ICD-10-CM | POA: Diagnosis not present

## 2021-11-17 DIAGNOSIS — Z5321 Procedure and treatment not carried out due to patient leaving prior to being seen by health care provider: Secondary | ICD-10-CM | POA: Insufficient documentation

## 2021-11-17 LAB — URINALYSIS, ROUTINE W REFLEX MICROSCOPIC
Bacteria, UA: NONE SEEN
Bilirubin Urine: NEGATIVE
Glucose, UA: NEGATIVE mg/dL
Ketones, ur: NEGATIVE mg/dL
Leukocytes,Ua: NEGATIVE
Nitrite: NEGATIVE
Protein, ur: NEGATIVE mg/dL
Specific Gravity, Urine: 1.023 (ref 1.005–1.030)
pH: 5 (ref 5.0–8.0)

## 2021-11-17 LAB — COMPREHENSIVE METABOLIC PANEL
ALT: 19 U/L (ref 0–44)
AST: 16 U/L (ref 15–41)
Albumin: 4.4 g/dL (ref 3.5–5.0)
Alkaline Phosphatase: 59 U/L (ref 38–126)
Anion gap: 6 (ref 5–15)
BUN: 10 mg/dL (ref 6–20)
CO2: 25 mmol/L (ref 22–32)
Calcium: 9.4 mg/dL (ref 8.9–10.3)
Chloride: 108 mmol/L (ref 98–111)
Creatinine, Ser: 0.73 mg/dL (ref 0.44–1.00)
GFR, Estimated: >60 mL/min (ref >60)
Glucose, Bld: 123 mg/dL — ABNORMAL HIGH (ref 70–99)
Potassium: 4 mmol/L (ref 3.5–5.1)
Sodium: 139 mmol/L (ref 135–145)
Total Bilirubin: 0.7 mg/dL (ref 0.3–1.2)
Total Protein: 8.7 g/dL — ABNORMAL HIGH (ref 6.5–8.1)

## 2021-11-17 LAB — LIPASE, BLOOD: Lipase: 26 U/L (ref 11–51)

## 2021-11-17 LAB — CBC WITH DIFFERENTIAL/PLATELET
Abs Immature Granulocytes: 0.04 10*3/uL (ref 0.00–0.07)
Basophils Absolute: 0.1 10*3/uL (ref 0.0–0.1)
Basophils Relative: 1 %
Eosinophils Absolute: 0.1 10*3/uL (ref 0.0–0.5)
Eosinophils Relative: 1 %
HCT: 46 % (ref 36.0–46.0)
Hemoglobin: 15.2 g/dL — ABNORMAL HIGH (ref 12.0–15.0)
Immature Granulocytes: 0 %
Lymphocytes Relative: 29 %
Lymphs Abs: 3.2 10*3/uL (ref 0.7–4.0)
MCH: 28.7 pg (ref 26.0–34.0)
MCHC: 33 g/dL (ref 30.0–36.0)
MCV: 86.8 fL (ref 80.0–100.0)
Monocytes Absolute: 0.4 10*3/uL (ref 0.1–1.0)
Monocytes Relative: 4 %
Neutro Abs: 7.1 10*3/uL (ref 1.7–7.7)
Neutrophils Relative %: 65 %
Platelets: 322 10*3/uL (ref 150–400)
RBC: 5.3 MIL/uL — ABNORMAL HIGH (ref 3.87–5.11)
RDW: 14.8 % (ref 11.5–15.5)
WBC: 10.9 10*3/uL — ABNORMAL HIGH (ref 4.0–10.5)
nRBC: 0 % (ref 0.0–0.2)

## 2021-11-17 LAB — I-STAT BETA HCG BLOOD, ED (MC, WL, AP ONLY): I-stat hCG, quantitative: <5 m[IU]/mL (ref <5)

## 2021-11-17 MED ORDER — SODIUM CHLORIDE (PF) 0.9 % IJ SOLN
INTRAMUSCULAR | Status: AC
  Filled 2021-11-17: qty 50

## 2021-11-17 MED ORDER — IOHEXOL 300 MG/ML  SOLN: 100.0000 mL | Freq: Once | INTRAMUSCULAR | Status: DC | PRN

## 2021-11-17 NOTE — ED Provider Triage Note (Signed)
Emergency Medicine Provider Triage Evaluation Note  MADALIN HUGHART , a 41 y.o. female  was evaluated in triage.  Pt complains of abd pain x 1 week. Known ventral hernia. Typical has pain however no persistent. Some loose stool. No melena or BRBPR. No emesis, fever. Has apt next week to see gen surgery for intervention  Review of Systems  Positive: Abd pain, loose stool Negative: fever  Physical Exam  BP 118/85 (BP Location: Right Arm)   Pulse 81   Temp 99.4 F (37.4 C) (Oral)   Resp 18   Ht '5\' 3"'$  (1.6 m)   Wt 86.2 kg   LMP 10/24/2021   SpO2 92%   BMI 33.66 kg/m  Gen:   Awake, no distress   Resp:  Normal effort  Abd:  Ventral hernia MSK:   Moves extremities without difficulty  Other:    Medical Decision Making  Medically screening exam initiated at 3:10 PM.  Appropriate orders placed.  VERNAL RUTAN was informed that the remainder of the evaluation will be completed by another provider, this initial triage assessment does not replace that evaluation, and the importance of remaining in the ED until their evaluation is complete.  Abd pain   Summer Parthasarathy A, PA-C 11/17/21 1515

## 2021-11-17 NOTE — ED Triage Notes (Signed)
Patient states she has a known umbilical hernia and states she has an appointment to see a surgeon next week.  Patient reports increased cramping pain that radiates into her back x 2 weeks. Patient states she has been having more frequent soft stools than normal.

## 2021-11-22 ENCOUNTER — Other Ambulatory Visit (HOSPITAL_COMMUNITY): Payer: Self-pay | Admitting: Surgery

## 2021-11-22 DIAGNOSIS — K439 Ventral hernia without obstruction or gangrene: Secondary | ICD-10-CM

## 2021-11-22 DIAGNOSIS — R1084 Generalized abdominal pain: Secondary | ICD-10-CM

## 2021-11-23 ENCOUNTER — Ambulatory Visit (HOSPITAL_COMMUNITY)
Admission: RE | Admit: 2021-11-23 | Discharge: 2021-11-23 | Disposition: A | Discharge status: Home / Self Care | Payer: Medicaid Other | Source: Ambulatory Visit | Attending: Surgery | Admitting: Surgery

## 2021-11-23 DIAGNOSIS — R1084 Generalized abdominal pain: Secondary | ICD-10-CM

## 2021-11-23 DIAGNOSIS — K439 Ventral hernia without obstruction or gangrene: Secondary | ICD-10-CM | POA: Insufficient documentation

## 2021-11-23 MED ORDER — IOHEXOL 300 MG/ML  SOLN
100.0000 mL | Freq: Once | INTRAMUSCULAR | Status: AC | PRN
  Administered 2021-11-23: 100 mL via INTRAVENOUS

## 2021-11-23 MED ORDER — SODIUM CHLORIDE (PF) 0.9 % IJ SOLN
INTRAMUSCULAR | Status: AC
  Filled 2021-11-23: qty 50

## 2021-11-30 ENCOUNTER — Ambulatory Visit: Payer: Self-pay | Admitting: Surgery

## 2021-11-30 NOTE — Progress Notes (Addendum)
Anesthesia Review:  PCP: Cardiologist : Chest x-ray : EKG : Echo : Stress test: Cardiac Cath :  Activity level:  Sleep Study/ CPAP : Fasting Blood Sugar :      / Checks Blood Sugar -- times a day:   Blood Thinner/ Instructions /Last Dose: ASA / Instructions/ Last Dose :

## 2021-11-30 NOTE — Patient Instructions (Signed)
SURGICAL WAITING ROOM VISITATION Patients having surgery or a procedure may have no more than 2 support people in the waiting area - these visitors may rotate.   Children under the age of 79 must have an adult with them who is not the patient. If the patient needs to stay at the hospital during part of their recovery, the visitor guidelines for inpatient rooms apply. Pre-op nurse will coordinate an appropriate time for 1 support person to accompany patient in pre-op.  This support person may not rotate.    Please refer to the [facility] website for the visitor guidelines for Inpatients (after your surgery is over and you are in a regular room).       Your procedure is scheduled on:  12/14/2021    Report to [facility] Main Entrance    Report to admitting at   0700AM   Call this number if you have problems the morning of surgery [phone]   Do not eat food :After Midnight.   After Midnight you may have the following liquids until __ 0600____ AM  DAY OF SURGERY  Water Non-Citrus Juices (without pulp, NO RED) Carbonated Beverages Black Coffee (NO MILK/CREAM OR CREAMERS, sugar ok)  Clear Tea (NO MILK/CREAM OR CREAMERS, sugar ok) regular and decaf                             Plain Jell-O (NO RED)                                           Fruit ices (not with fruit pulp, NO RED)                                     Popsicles (NO RED)                                                               Sports drinks like Gatorade (NO RED)                    The day of surgery:  Drink ONE (1) Pre-Surgery Clear Ensure or G2 at AM the morning of surgery. Drink in one sitting. Do not sip.  This drink was given to you during your hospital  pre-op appointment visit. Nothing else to drink after completing the  Pre-Surgery Clear Ensure or G2.          If you have questions, please contact your surgeon's office.       Oral Hygiene is also important to reduce your risk of infection.                                     Remember - BRUSH YOUR TEETH THE MORNING OF SURGERY WITH YOUR REGULAR TOOTHPASTE   Do NOT smoke after Midnight   Take these medicines the morning of surgery with A SIP OF WATER:  none   DO NOT TAKE ANY ORAL DIABETIC MEDICATIONS DAY OF YOUR  SURGERY  Bring CPAP mask and tubing day of surgery.                              You may not have any metal on your body including hair pins, jewelry, and body piercing             Do not wear make-up, lotions, powders, perfumes/cologne, or deodorant  Do not wear nail polish including gel and S&S, artificial/acrylic nails, or any other type of covering on natural nails including finger and toenails. If you have artificial nails, gel coating, etc. that needs to be removed by a nail salon please have this removed prior to surgery or surgery may need to be canceled/ delayed if the surgeon/ anesthesia feels like they are unable to be safely monitored.   Do not shave  48 hours prior to surgery.               Men may shave face and neck.   Do not bring valuables to the hospital. [place].   Contacts, dentures or bridgework may not be worn into surgery.   Bring small overnight bag day of surgery.   DO NOT [place]. PHARMACY WILL DISPENSE MEDICATIONS LISTED ON YOUR MEDICATION LIST TO YOU DURING YOUR ADMISSION [place]!    Patients discharged on the day of surgery will not be allowed to drive home.  Someone NEEDS to stay with you for the first 24 hours after anesthesia.   Special Instructions: Bring a copy of your healthcare power of attorney and living will documents the day of surgery if you haven't scanned them before.              Please read over the following fact sheets you were given: IF [place] [phone]   If you received a COVID test during your pre-op visit  it  is requested that you wear a mask when out in public, stay away from anyone that may not be feeling well and notify your surgeon if you develop symptoms. If you test positive for Covid or have been in contact with anyone that has tested positive in the last 10 days please notify you surgeon.     [facility] - Preparing for Surgery Before surgery, you can play an important role.  Because skin is not sterile, your skin needs to be as free of germs as possible.  You can reduce the number of germs on your skin by washing with CHG (chlorahexidine gluconate) soap before surgery.  CHG is an antiseptic cleaner which kills germs and bonds with the skin to continue killing germs even after washing. Please DO NOT use if you have an allergy to CHG or antibacterial soaps.  If your skin becomes reddened/irritated stop using the CHG and inform your nurse when you arrive at Short Stay. Do not shave (including legs and underarms) for at least 48 hours prior to the first CHG shower.  You may shave your face/neck. Please follow these instructions carefully:  1.  Shower with CHG Soap the night before surgery and the  morning of Surgery.  2.  If you choose to wash your hair, wash your hair first as usual with your  normal  shampoo.  3.  After  you shampoo, rinse your hair and body thoroughly to remove the  shampoo.                           4.  Use CHG as you would any other liquid soap.  You can apply chg directly  to the skin and wash                       Gently with a scrungie or clean washcloth.  5.  Apply the CHG Soap to your body ONLY FROM THE NECK DOWN.   Do not use on face/ open                           Wound or open sores. Avoid contact with eyes, ears mouth and genitals (private parts).                       Wash face,  Genitals (private parts) with your normal soap.             6.  Wash thoroughly, paying special attention to the area where your surgery  will be performed.  7.  Thoroughly rinse your body with  warm water from the neck down.  8.  DO NOT shower/wash with your normal soap after using and rinsing off  the CHG Soap.                9.  Pat yourself dry with a clean towel.            10.  Wear clean pajamas.            11.  Place clean sheets on your bed the night of your first shower and do not  sleep with pets. Day of Surgery : Do not apply any lotions/deodorants the morning of surgery.  Please wear clean clothes to the hospital/surgery center.  FAILURE TO FOLLOW THESE INSTRUCTIONS MAY RESULT IN THE CANCELLATION OF YOUR SURGERY PATIENT SIGNATURE_________________________________  NURSE SIGNATURE__________________________________  ________________________________________________________________________

## 2021-11-30 NOTE — Progress Notes (Signed)
Spoke with Abigail Butts to request pre op orders in epic.

## 2021-12-04 ENCOUNTER — Encounter (HOSPITAL_COMMUNITY): Payer: Self-pay

## 2021-12-04 ENCOUNTER — Encounter (HOSPITAL_COMMUNITY)
Admission: RE | Admit: 2021-12-04 | Discharge: 2021-12-04 | Disposition: A | Discharge status: Home / Self Care | Payer: Medicaid Other | Source: Ambulatory Visit | Attending: Surgery | Admitting: Surgery

## 2021-12-04 ENCOUNTER — Other Ambulatory Visit (HOSPITAL_COMMUNITY): Payer: Medicaid Other

## 2021-12-04 ENCOUNTER — Other Ambulatory Visit: Payer: Self-pay

## 2021-12-04 VITALS — BP 131/91 | HR 57 | Temp 98.3°F | Resp 16 | Ht 63.0 in | Wt 186.0 lb

## 2021-12-04 DIAGNOSIS — Z01818 Encounter for other preprocedural examination: Secondary | ICD-10-CM | POA: Insufficient documentation

## 2021-12-04 HISTORY — DX: Hypothyroidism, unspecified: E03.9

## 2021-12-04 LAB — CBC
HCT: 42.6 % (ref 36.0–46.0)
Hemoglobin: 13.9 g/dL (ref 12.0–15.0)
MCH: 28.3 pg (ref 26.0–34.0)
MCHC: 32.6 g/dL (ref 30.0–36.0)
MCV: 86.6 fL (ref 80.0–100.0)
Platelets: 391 10*3/uL (ref 150–400)
RBC: 4.92 MIL/uL (ref 3.87–5.11)
RDW: 14.6 % (ref 11.5–15.5)
WBC: 8.7 10*3/uL (ref 4.0–10.5)
nRBC: 0 % (ref 0.0–0.2)

## 2021-12-05 ENCOUNTER — Encounter (HOSPITAL_COMMUNITY): Payer: Self-pay | Admitting: Physician Assistant

## 2021-12-05 ENCOUNTER — Ambulatory Visit (INDEPENDENT_AMBULATORY_CARE_PROVIDER_SITE_OTHER): Payer: Medicaid Other | Admitting: Internal Medicine

## 2021-12-05 ENCOUNTER — Encounter: Payer: Self-pay | Admitting: Internal Medicine

## 2021-12-05 VITALS — BP 110/78 | HR 80 | Ht 63.25 in | Wt 190.4 lb

## 2021-12-05 DIAGNOSIS — K439 Ventral hernia without obstruction or gangrene: Secondary | ICD-10-CM | POA: Diagnosis not present

## 2021-12-05 DIAGNOSIS — R935 Abnormal findings on diagnostic imaging of other abdominal regions, including retroperitoneum: Secondary | ICD-10-CM

## 2021-12-05 DIAGNOSIS — R109 Unspecified abdominal pain: Secondary | ICD-10-CM | POA: Diagnosis not present

## 2021-12-05 MED ORDER — NA SULFATE-K SULFATE-MG SULF 17.5-3.13-1.6 GM/177ML PO SOLN: 1.0000 | Freq: Once | ORAL | 0 refills | Status: AC

## 2021-12-05 NOTE — Patient Instructions (Signed)
_______________________________________________________  If you are age 42 or older, your body mass index should be between 23-30. Your Body mass index is 33.46 kg/m. If this is out of the aforementioned range listed, please consider follow up with your Primary Care Provider.  If you are age 11 or younger, your body mass index should be between 19-25. Your Body mass index is 33.46 kg/m. If this is out of the aformentioned range listed, please consider follow up with your Primary Care Provider.   ________________________________________________________  The [facility] providers would like to encourage you to use [facility] to communicate with providers for non-urgent requests or questions.  Due to long hold times on the telephone, sending your provider a message by [facility] may be a faster and more efficient way to get a response.  Please allow 48 business hours for a response.  Please remember that this is for non-urgent requests.  _______________________________________________________  Valerie Walter have been scheduled for a colonoscopy. Please follow written instructions given to you at your visit today.  Please pick up your prep supplies at the pharmacy within the next 1-3 days. If you use inhalers (even only as needed), please bring them with you on the day of your procedure.

## 2021-12-05 NOTE — Progress Notes (Signed)
HISTORY OF PRESENT ILLNESS:  Valerie Walter is a 41 y.o. 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 Johnathan Hausen regarding an abnormal CT scan of the abdomen.  Patient's history dates back to December 2022 when she presented to the emergency room with abdominal pain.  She underwent CT scan which was said to show cecal diverticulitis.  She was treated with antibiotics.  She was noted to have a ventral hernia at that time.  Since that time she has had progressive discomfort related to her ventral hernia for which she was evaluated by Dr. Hassell Done.  She was scheduled for surgery December 14, 2021.  A preoperative CT scan of the abdomen and pelvis with contrast was performed November 23, 2021.  She was said to have an ill-defined 3.3 cm area of focal enhancement in the medial wall of the cecum.  Endoscopic correlation was suggested.  She was also said to have "partial clearing of the pericecal stranding suggesting decrease in cecal diverticulitis".  Large ventral hernia demonstrated with the hernia sac measuring 8.2 cm.  There was also umbilical hernia with fat.  She is status post cholecystectomy.  Patient notices that she has discomfort when she presses on the hernia.  Otherwise she feels well.  No family history of colon cancer.  Review of blood work from October 2023 shows normal CBC with hemoglobin 13.9.  Unremarkable comprehensive metabolic panel.  Negative pregnancy testing.  REVIEW OF SYSTEMS:  All non-GI ROS negative unless otherwise stated in the HPI except for back pain  Past Medical History:  Diagnosis Date   Gallstones    Hypothyroidism    Miscarriage    Ovarian cyst    Seasonal allergies    Shingles    Thyroid disease    Trichomonas    Umbilical hernia    Ventral hernia     Past Surgical History:  Procedure Laterality Date   APPENDECTOMY     DILATION AND CURETTAGE OF UTERUS     IRRIGATION AND DEBRIDEMENT ABSCESS      Social  History Valerie Walter  reports that she quit smoking about 2 months ago. Her smoking use included cigarettes. She smoked an average of .5 packs per day. She has never used smokeless tobacco. She reports current alcohol use. She reports that she does not use drugs.  family history includes Breast cancer in her mother; Diabetes in her maternal grandmother; Gout in her brother; Hypertension in her maternal grandmother; Lupus in her maternal grandmother.  Allergies  Allergen Reactions   Other Hives    All nuts       PHYSICAL EXAMINATION: Vital signs: BP 110/78 (BP Location: Left Arm, Patient Position: Sitting, Cuff Size: Normal)   Pulse 80   Ht 5' 3.25" (1.607 m) Comment: height measured without shoes  Wt 190 lb 6 oz (86.4 kg)   LMP 11/26/2021   BMI 33.46 kg/m   Constitutional: generally well-appearing, no acute distress Psychiatric: alert and oriented x3, cooperative Eyes: extraocular movements intact, anicteric, conjunctiva pink Mouth: oral pharynx moist, no lesions Neck: supple no lymphadenopathy Cardiovascular: heart regular rate and rhythm, no murmur Lungs: clear to auscultation bilaterally Abdomen: soft, large ventral hernia tender to palpation , nondistended, no obvious ascites, no peritoneal signs, normal bowel sounds, no organomegaly Rectal: Deferred until colonoscopy Extremities: no lower extremity edema bilaterally Skin: no lesions on visible extremities.  Tattoos Neuro: No focal deficits.  Cranial nerves intact  ASSESSMENT:  1.  Abnormal CT  scan with 3.3 cm area of abnormality in the medial cecum as described.  Rule out neoplasia. 2.  Large ventral hernia.  Symptomatic.  Anticipating repair 3.  Status postcholecystectomy   PLAN:  1.  Schedule colonoscopy.  Patient is high risk due to the abnormal anatomy.  It is also possible that we may not be able to complete the exam due to the large hernia.  She understands.The nature of the procedure, as well as the risks,  benefits, and alternatives were carefully and thoroughly reviewed with the patient. Ample time for discussion and questions allowed. The patient understood, was satisfied, and agreed to proceed. 2.  I discussed the case with Dr. Hassell Done by telephone this afternoon.  We will plan to postpone her ventral hernia repair until the results of the colonoscopy are known. Total time of 60 minutes was spent preparing to see the patient, reviewing x-rays and laboratories, obtaining comprehensive history, performing medically appropriate physical examination, ordering colonoscopy, conversing with consultant, counseling and educating the patient regarding the above listed issues, and documenting clinical information in the health record

## 2021-12-11 ENCOUNTER — Encounter: Payer: Self-pay | Admitting: Internal Medicine

## 2021-12-11 ENCOUNTER — Ambulatory Visit (AMBULATORY_SURGERY_CENTER): Payer: Medicaid Other | Admitting: Internal Medicine

## 2021-12-11 VITALS — BP 122/74 | HR 56 | Temp 97.5°F | Resp 16 | Ht 63.0 in | Wt 190.0 lb

## 2021-12-11 DIAGNOSIS — R109 Unspecified abdominal pain: Secondary | ICD-10-CM

## 2021-12-11 DIAGNOSIS — K439 Ventral hernia without obstruction or gangrene: Secondary | ICD-10-CM

## 2021-12-11 DIAGNOSIS — K5732 Diverticulitis of large intestine without perforation or abscess without bleeding: Secondary | ICD-10-CM | POA: Diagnosis not present

## 2021-12-11 DIAGNOSIS — R935 Abnormal findings on diagnostic imaging of other abdominal regions, including retroperitoneum: Secondary | ICD-10-CM

## 2021-12-11 DIAGNOSIS — K639 Disease of intestine, unspecified: Secondary | ICD-10-CM | POA: Diagnosis not present

## 2021-12-11 MED ORDER — AMOXICILLIN-POT CLAVULANATE 875-125 MG PO TABS: 1.0000 | ORAL_TABLET | Freq: Two times a day (BID) | ORAL | 0 refills | Status: DC

## 2021-12-11 MED ORDER — SODIUM CHLORIDE 0.9 % IV SOLN: 500.0000 mL | Freq: Once | INTRAVENOUS | Status: DC

## 2021-12-11 NOTE — Progress Notes (Signed)
Pt resting comfortably. VSS. Airway intact. SBAR complete to RN. All questions answered.   

## 2021-12-11 NOTE — Progress Notes (Signed)
HISTORY OF PRESENT ILLNESS:   Valerie Walter is a 41 y.o. 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 Johnathan Hausen regarding an abnormal CT scan of the abdomen.  Patient's history dates back to December 2022 when she presented to the emergency room with abdominal pain.  She underwent CT scan which was said to show cecal diverticulitis.  She was treated with antibiotics.  She was noted to have a ventral hernia at that time.  Since that time she has had progressive discomfort related to her ventral hernia for which she was evaluated by Dr. Hassell Done.  She was scheduled for surgery December 14, 2021.  A preoperative CT scan of the abdomen and pelvis with contrast was performed November 23, 2021.  She was said to have an ill-defined 3.3 cm area of focal enhancement in the medial wall of the cecum.  Endoscopic correlation was suggested.  She was also said to have "partial clearing of the pericecal stranding suggesting decrease in cecal diverticulitis".  Large ventral hernia demonstrated with the hernia sac measuring 8.2 cm.  There was also umbilical hernia with fat.  She is status post cholecystectomy.  Patient notices that she has discomfort when she presses on the hernia.  Otherwise she feels well.  No family history of colon cancer.  Review of blood work from October 2023 shows normal CBC with hemoglobin 13.9.  Unremarkable comprehensive metabolic panel.  Negative pregnancy testing.   REVIEW OF SYSTEMS:   All non-GI ROS negative unless otherwise stated in the HPI except for back pain       Past Medical History:  Diagnosis Date   Gallstones     Hypothyroidism     Miscarriage     Ovarian cyst     Seasonal allergies     Shingles     Thyroid disease     Trichomonas     Umbilical hernia     Ventral hernia             Past Surgical History:  Procedure Laterality Date   APPENDECTOMY       DILATION AND CURETTAGE OF UTERUS       IRRIGATION AND DEBRIDEMENT ABSCESS           Social History Valerie Walter  reports that she quit smoking about 2 months ago. Her smoking use included cigarettes. She smoked an average of .5 packs per day. She has never used smokeless tobacco. She reports current alcohol use. She reports that she does not use drugs.   family history includes Breast cancer in her mother; Diabetes in her maternal grandmother; Gout in her brother; Hypertension in her maternal grandmother; Lupus in her maternal grandmother.        Allergies  Allergen Reactions   Other Hives      All nuts          PHYSICAL EXAMINATION: Vital signs: BP 110/78 (BP Location: Left Arm, Patient Position: Sitting, Cuff Size: Normal)   Pulse 80   Ht 5' 3.25" (1.607 m) Comment: height measured without shoes  Wt 190 lb 6 oz (86.4 kg)   LMP 11/26/2021   BMI 33.46 kg/m   Constitutional: generally well-appearing, no acute distress Psychiatric: alert and oriented x3, cooperative Eyes: extraocular movements intact, anicteric, conjunctiva pink Mouth: oral pharynx moist, no lesions Neck: supple no lymphadenopathy Cardiovascular: heart regular rate and rhythm, no murmur Lungs: clear to auscultation bilaterally Abdomen: soft, large ventral hernia tender to palpation ,  nondistended, no obvious ascites, no peritoneal signs, normal bowel sounds, no organomegaly Rectal: Deferred until colonoscopy Extremities: no lower extremity edema bilaterally Skin: no lesions on visible extremities.  Tattoos Neuro: No focal deficits.  Cranial nerves intact   ASSESSMENT:   1.  Abnormal CT scan with 3.3 cm area of abnormality in the medial cecum as described.  Rule out neoplasia. 2.  Large ventral hernia.  Symptomatic.  Anticipating repair 3.  Status postcholecystectomy     PLAN:   1.  Schedule colonoscopy.  Patient is high risk due to the abnormal anatomy.  It is also possible that we may not be able to complete the exam due to the large hernia.  She understands.The nature of  the procedure, as well as the risks, benefits, and alternatives were carefully and thoroughly reviewed with the patient. Ample time for discussion and questions allowed. The patient understood, was satisfied, and agreed to proceed. 2.  I discussed the case with Dr. Hassell Done by telephone this afternoon.  We will plan to postpone her ventral hernia repair until the results of the colonoscopy are known.

## 2021-12-11 NOTE — Patient Instructions (Addendum)
-   Repeat colonoscopy in 10 years for screening purposes.  - Resume previous diet.  - Continue present medications.  - Prescribe Augmentin 875 mg p.o. twice daily; #20; no refills  - Dr. Earlie Server office for follow-up with you regarding scheduling ventral hernia repair  Handout and information given for diverticulosis and diverticulitis  YOU HAD AN ENDOSCOPIC PROCEDURE TODAY AT [facility]:   Refer to the procedure report that was given to you for any specific questions about what was found during the examination.  If the procedure report does not answer your questions, please call your gastroenterologist to clarify.  If you requested that your care partner not be given the details of your procedure findings, then the procedure report has been included in a sealed envelope for you to review at your convenience later.  YOU SHOULD EXPECT: Some feelings of bloating in the abdomen. Passage of more gas than usual.  Walking can help get rid of the air that was put into your GI tract during the procedure and reduce the bloating. If you had a lower endoscopy (such as a colonoscopy or flexible sigmoidoscopy) you may notice spotting of blood in your stool or on the toilet paper. If you underwent a bowel prep for your procedure, you may not have a normal bowel movement for a few days.  Please Note:  You might notice some irritation and congestion in your nose or some drainage.  This is from the oxygen used during your procedure.  There is no need for concern and it should clear up in a day or so.  SYMPTOMS TO REPORT IMMEDIATELY:  Following lower endoscopy (colonoscopy):  Excessive amounts of blood in the stool  Significant tenderness or worsening of abdominal pains  Swelling of the abdomen that is new, acute  Fever of 100F or higher  For urgent or emergent issues, a gastroenterologist can be reached at any hour by calling [phone]. Do not use MyChart messaging for urgent  concerns.    DIET:  We do recommend a small meal at first, but then you may proceed to your regular diet.  Drink plenty of fluids but you should avoid alcoholic beverages for 24 hours.  ACTIVITY:  You should plan to take it easy for the rest of today and you should NOT DRIVE or use heavy machinery until tomorrow (because of the sedation medicines used during the test).    FOLLOW UP: Our staff will call the number listed on your records the next business day following your procedure.  We will call around 7:15- 8:00 am to check on you and address any questions or concerns that you may have regarding the information given to you following your procedure. If we do not reach you, we will leave a message.     If any biopsies were taken you will be contacted by phone or by letter within the next 1-3 weeks.  Please call us at [phone] if you have not heard about the biopsies in 3 weeks.    SIGNATURES/CONFIDENTIALITY: You and/or your care partner have signed paperwork which will be entered into your electronic medical record.  These signatures attest to the fact that that the information above on your After Visit Summary has been reviewed and is understood.  Full responsibility of the confidentiality of this discharge information lies with you and/or your care-partner.

## 2021-12-11 NOTE — Progress Notes (Signed)
VS completed by DT.  Pt's states no medical or surgical changes since previsit or office visit.  

## 2021-12-11 NOTE — Op Note (Signed)
[facility] Patient Name: Valerie Walter Procedure Date: 12/11/2021 2:56 PM MRN: [id number] Endoscopist: Docia Chuck. Henrene Pastor , MD, [id number] Age: 41 Referring MD:  Date of Birth: 24-Oct-1980 Gender: Female Account #: [id number] Procedure:                Colonoscopy with biopsies Indications:              Abdominal pain, Abnormal CT of the GI tract Medicines:                Monitored Anesthesia Care Procedure:                Pre-Anesthesia Assessment:                           - Prior to the procedure, a History and Physical                            was performed, and patient medications and                            allergies were reviewed. The patient's tolerance of                            previous anesthesia was also reviewed. The risks                            and benefits of the procedure and the sedation                            options and risks were discussed with the patient.                            All questions were answered, and informed consent                            was obtained. Prior Anticoagulants: The patient has                            taken no anticoagulant or antiplatelet agents. ASA                            Grade Assessment: II - A patient with mild systemic                            disease. After reviewing the risks and benefits,                            the patient was deemed in satisfactory condition to                            undergo the procedure.                           After obtaining informed consent, the colonoscope  was passed under direct vision. Throughout the                            procedure, the patient's blood pressure, pulse, and                            oxygen saturations were monitored continuously. The                            CF [id number] #[id number] was introduced through the anus                            and advanced to the the cecum, identified by                             appendiceal orifice and ileocecal valve. The                            ileocecal valve, appendiceal orifice, and rectum                            were photographed. The quality of the bowel                            preparation was excellent. The colonoscopy was                            performed without difficulty. The patient tolerated                            the procedure well. The bowel preparation used was                            SUPREP via split dose instruction. Scope In: 3:03:45 PM Scope Out: 3:20:43 PM Scope Withdrawal Time: 0 hours 13 minutes 27 seconds  Total Procedure Duration: 0 hours 16 minutes 58 seconds  Findings:                 Many large-mouthed diverticula were found in the                            entire colon. In the cecum and most proximal right                            colon there inflammatory changes. No obvious mass.                            The ileocecal valve was biopsied. Were biopsies                            were taken with a cold forceps for histology.                           The exam was otherwise without  abnormality on                            direct and retroflexion views. Complications:            No immediate complications. Estimated blood loss:                            None. Estimated Blood Loss:     Estimated blood loss: none. Impression:               - Diverticulosis in the entire examined colon.                            There appeared to be cecal diverticulitis.                            Inflammatory area at the ileocecal valve was                            biopsied.                           - The examination was otherwise normal on direct                            and retroflexion views.                           -Ventral hernia Recommendation:           - Repeat colonoscopy in 10 years for screening                            purposes.                           - Patient has a contact number available for                             emergencies. The signs and symptoms of potential                            delayed complications were discussed with the                            patient. Return to normal activities tomorrow.                            Written discharge instructions were provided to the                            patient.                           - Resume previous diet.                           -  Continue present medications.                           - Await pathology results.                           - Prescribe Augmentin 875 mg p.o. twice daily; #20;                            no refills                           - Dr. Earlie Server office for follow-up with you                            regarding scheduling ventral hernia repair Docia Chuck. Henrene Pastor, MD 12/11/2021 3:32:47 PM This report has been signed electronically.

## 2021-12-11 NOTE — Progress Notes (Signed)
Called to room to assist during endoscopic procedure.  Patient ID and intended procedure confirmed with present staff. Received instructions for my participation in the procedure from the performing physician.  

## 2021-12-12 ENCOUNTER — Telehealth: Payer: Self-pay | Admitting: *Deleted

## 2021-12-12 NOTE — Telephone Encounter (Signed)
  Follow up Call-     12/11/2021    2:13 PM  Call back number  Post procedure Call Back phone  # [phone]  Permission to leave phone message Yes     Patient questions:  Do you have a fever, pain , or abdominal swelling? No. Pain Score  0 *  Have you tolerated food without any problems? Yes.    Have you been able to return to your normal activities? Yes.    Do you have any questions about your discharge instructions: Diet   No. Medications  No. Follow up visit  No.  Do you have questions or concerns about your Care? No.  Actions: * If pain score is 4 or above: No action needed, pain <4. Pt says she has not had a BM yet and still feels bloated and a little nauseated. Pt denies fever or bleeding . Encouraged warm liquids and staying away from gas producing foods today .

## 2021-12-14 ENCOUNTER — Ambulatory Visit (HOSPITAL_COMMUNITY): Admission: RE | Admit: 2021-12-14 | Payer: Medicaid Other | Source: Ambulatory Visit | Admitting: Surgery

## 2021-12-14 ENCOUNTER — Encounter: Payer: Self-pay | Admitting: Internal Medicine

## 2021-12-14 ENCOUNTER — Encounter (HOSPITAL_COMMUNITY): Admission: RE | Payer: Self-pay | Source: Ambulatory Visit

## 2021-12-14 DIAGNOSIS — Z01818 Encounter for other preprocedural examination: Secondary | ICD-10-CM

## 2021-12-14 SURGERY — REPAIR, HERNIA, VENTRAL, LAPAROSCOPIC
Anesthesia: General

## 2021-12-24 NOTE — Patient Instructions (Signed)
SURGICAL WAITING ROOM VISITATION Patients having surgery or a procedure may have no more than 2 support people in the waiting area - these visitors may rotate.   Children under the age of 96 must have an adult with them who is not the patient. If the patient needs to stay at the hospital during part of their recovery, the visitor guidelines for inpatient rooms apply. Pre-op nurse will coordinate an appropriate time for 1 support person to accompany patient in pre-op.  This support person may not rotate.    Please refer to the [facility] website for the visitor guidelines for Inpatients (after your surgery is over and you are in a regular room).      Your procedure is scheduled on: 12-31-21   Report to [facility] Main Entrance    Report to admitting at 10:15 AM   Call this number if you have problems the morning of surgery [phone]   Do not eat food :After Midnight.   After Midnight you may have the following liquids until 9:30 AM DAY OF SURGERY  Water Non-Citrus Juices (without pulp, NO RED) Carbonated Beverages Black Coffee (NO MILK/CREAM OR CREAMERS, sugar ok)  Clear Tea (NO MILK/CREAM OR CREAMERS, sugar ok) regular and decaf                             Plain Jell-O (NO RED)                                           Fruit ices (not with fruit pulp, NO RED)                                     Popsicles (NO RED)                                                               Sports drinks like Gatorade (NO RED)                   The day of surgery:  Drink ONE (1) Pre-Surgery Clear Ensure at 9:30 AM the morning of surgery. Drink in one sitting. Do not sip.  This drink was given to you during your hospital  pre-op appointment visit. Nothing else to drink after completing the Pre-Surgery Clear Ensure.          If you have questions, please contact your surgeon's office.   FOLLOW BOWEL PREP AND ANY ADDITIONAL PRE OP INSTRUCTIONS YOU RECEIVED FROM YOUR SURGEON'S  OFFICE!!!     Oral Hygiene is also important to reduce your risk of infection.                                    Remember - BRUSH YOUR TEETH THE MORNING OF SURGERY WITH YOUR REGULAR TOOTHPASTE   Do NOT smoke after Midnight   Take these medicines the morning of surgery with A SIP OF WATER:  None  You may not have any metal on your body including hair pins, jewelry, and body piercing             Do not wear make-up, lotions, powders, perfumes or deodorant  Do not wear nail polish including gel and S&S, artificial/acrylic nails, or any other type of covering on natural nails including finger and toenails. If you have artificial nails, gel coating, etc. that needs to be removed by a nail salon please have this removed prior to surgery or surgery may need to be canceled/ delayed if the surgeon/ anesthesia feels like they are unable to be safely monitored.   Do not shave  48 hours prior to surgery.    Do not bring valuables to the hospital. [place].   Contacts, dentures or bridgework may not be worn into surgery.  DO NOT [place]. PHARMACY WILL DISPENSE MEDICATIONS LISTED ON YOUR MEDICATION LIST TO YOU DURING YOUR ADMISSION [place]!    Patients discharged on the day of surgery will not be allowed to drive home.  Someone NEEDS to stay with you for the first 24 hours after anesthesia.   Special Instructions: Bring a copy of your healthcare power of attorney and living will documents the day of surgery if you haven't scanned them before.              Please read over the following fact sheets you were given: IF [place] [phone]   If you received a COVID test during your pre-op visit  it is requested that you wear a mask when out in public, stay away from anyone that may not be feeling well and notify your surgeon if you develop  symptoms. If you test positive for Covid or have been in contact with anyone that has tested positive in the last 10 days please notify you surgeon.  [facility] - Preparing for Surgery Before surgery, you can play an important role.  Because skin is not sterile, your skin needs to be as free of germs as possible.  You can reduce the number of germs on your skin by washing with CHG (chlorahexidine gluconate) soap before surgery.  CHG is an antiseptic cleaner which kills germs and bonds with the skin to continue killing germs even after washing. Please DO NOT use if you have an allergy to CHG or antibacterial soaps.  If your skin becomes reddened/irritated stop using the CHG and inform your nurse when you arrive at Short Stay. Do not shave (including legs and underarms) for at least 48 hours prior to the first CHG shower.  You may shave your face/neck.  Please follow these instructions carefully:  1.  Shower with CHG Soap the night before surgery and the  morning of surgery.  2.  If you choose to wash your hair, wash your hair first as usual with your normal  shampoo.  3.  After you shampoo, rinse your hair and body thoroughly to remove the shampoo.                             4.  Use CHG as you would any other liquid soap.  You can apply chg directly to the skin and wash.  Gently with a scrungie or clean washcloth.  5.  Apply the CHG Soap to your body ONLY FROM THE  NECK DOWN.   Do   not use on face/ open                           Wound or open sores. Avoid contact with eyes, ears mouth and   genitals (private parts).                       Wash face,  Genitals (private parts) with your normal soap.             6.  Wash thoroughly, paying special attention to the area where your    surgery  will be performed.  7.  Thoroughly rinse your body with warm water from the neck down.  8.  DO NOT shower/wash with your normal soap after using and rinsing off the CHG Soap.                9.  Pat yourself dry  with a clean towel.            10.  Wear clean pajamas.            11.  Place clean sheets on your bed the night of your first shower and do not  sleep with pets. Day of Surgery : Do not apply any lotions/deodorants the morning of surgery.  Please wear clean clothes to the hospital/surgery center.  FAILURE TO FOLLOW THESE INSTRUCTIONS MAY RESULT IN THE CANCELLATION OF YOUR SURGERY  PATIENT SIGNATURE_________________________________  NURSE SIGNATURE__________________________________  ________________________________________________________________________

## 2021-12-24 NOTE — Progress Notes (Signed)
Spoke to patient to give her updated arrival time for surgery on 12-31-21.  Patient was advised to arrive at 10:15.  Patient was reminded no solid food after midnight and that she can have clear liquids from midnight until 9:30 am the day of surgery.  No change in medical history per patient.  Surgery was rescheduled because she had to have a colonoscopy for abnormality found on CT which has been completed.  Patient's sister-in-law and daughter will take her home after surgery.  All questions answered and updated instructions emailed to patient.

## 2021-12-30 NOTE — H&P (Signed)
PROVIDER: Joya San, MD  MRN: [id number] DOB: 1980/06/08 DATE OF ENCOUNTER: 11/22/2021  Subjective  Chief Complaint: New Consultation (Ventral hernia)   History of Present Illness: Valerie Walter is a 41 y.o. female who is seen today as an office consultation at the request of Dr. Pasty Arch for evaluation of New Consultation (Ventral hernia) .  I reviewed her CT done in December 2022 when she was seen for acute cecal diverticulitis. She had moderate to severe pancolonic diverticulosis. She has had a previous lap chole but she also had a ventral hernia and at the time contain some fat. Of greater concern and and tender today is a supraumbilical hernia that I think may involve bowel. I want to repeat her CT scan and see if her colon is partially up in that area. She reports that she seems more constipated now and this is going along with more discomfort in that area. 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  I discussed laparoscopically assisted ventral hernia repair with mesh doing a combined laparoscopic and open Y. Would likely need to tackle the umbilical hernia as well. She seemed to understand this and ready to move forward.  Review of Systems: See HPI as well for other ROS.  ROS  Medical History: Past Medical History: Diagnosis Date Thyroid disease  Patient Active Problem List Diagnosis Facial cellulitis Hypokalemia Sepsis (CMS-HCC) Trichomoniasis  Past Surgical History: Procedure Laterality Date CHOLECYSTECTOMY   No Known Allergies  No current outpatient medications on file prior to visit.  No current facility-administered medications on file prior to visit.  Family History Problem Relation Age of Onset Breast cancer Mother   Social History  Tobacco Use Smoking Status Former Types: Cigarettes Quit date: 11/08/2021 Years since quitting: 0.0 Smokeless Tobacco Never   Social History  Socioeconomic History Marital  status: Single Tobacco Use Smoking status: Former Types: Cigarettes Quit date: 11/08/2021 Years since quitting: 0.0 Smokeless tobacco: Never Vaping Use Vaping Use: Never used Substance and Sexual Activity Alcohol use: Not Currently Drug use: Never  Objective:  Vitals: 11/22/21 1029 BP: 114/80 Weight: 85.8 kg (189 lb 3.2 oz) Height: 160 cm ('5\' 3"'$ )  Body mass index is 33.52 kg/m.  Physical Exam General: Well kempt African-American female who is having pain when she bends over and with getting up on the table HEENT : Unremarkable Chest: Chest is clear Heart: Sinus rhythm Breast: Not examined Abdomen: Broad-based area more prominent with standing, reducible but with some tenderness in the deep fascial palpation GU not examined Rectal not performed Extremities full range of motion Neuro alert and oriented x3. Motor and sensory function grossly intact  Labs, Imaging and Diagnostic Testing: I reviewed her CT scan from January 29, 2021  Assessment and Plan: Diagnoses and all orders for this visit:  Ventral hernia without obstruction or gangrene    Plan laparoscopically assisted repair of her ventral hernia.  She had a colonoscopy per Dr. Henrene Pastor to get ready for surgery.    Karrington Mccravy Donia Pounds, MD

## 2021-12-31 ENCOUNTER — Ambulatory Visit (HOSPITAL_COMMUNITY)
Admission: RE | Admit: 2021-12-31 | Discharge: 2021-12-31 | Disposition: A | Discharge status: Home / Self Care | Payer: Medicaid Other | Attending: Surgery | Admitting: Surgery

## 2021-12-31 ENCOUNTER — Ambulatory Visit (HOSPITAL_COMMUNITY): Payer: Medicaid Other | Admitting: Anesthesiology

## 2021-12-31 ENCOUNTER — Emergency Department (HOSPITAL_COMMUNITY)
Admission: EM | Admit: 2021-12-31 | Discharge: 2021-12-31 | Disposition: A | Discharge status: Home / Self Care | Payer: Medicaid Other | Attending: Emergency Medicine | Admitting: Emergency Medicine

## 2021-12-31 ENCOUNTER — Other Ambulatory Visit: Payer: Self-pay

## 2021-12-31 ENCOUNTER — Emergency Department (HOSPITAL_COMMUNITY): Payer: Medicaid Other

## 2021-12-31 ENCOUNTER — Encounter (HOSPITAL_COMMUNITY): Payer: Self-pay

## 2021-12-31 ENCOUNTER — Encounter (HOSPITAL_COMMUNITY)
Admission: RE | Disposition: A | Discharge status: Home / Self Care | Payer: Self-pay | Source: Home / Self Care | Attending: Surgery

## 2021-12-31 ENCOUNTER — Ambulatory Visit (HOSPITAL_BASED_OUTPATIENT_CLINIC_OR_DEPARTMENT_OTHER): Payer: Medicaid Other | Admitting: Anesthesiology

## 2021-12-31 ENCOUNTER — Encounter (HOSPITAL_COMMUNITY): Payer: Self-pay | Admitting: Surgery

## 2021-12-31 DIAGNOSIS — E039 Hypothyroidism, unspecified: Secondary | ICD-10-CM | POA: Insufficient documentation

## 2021-12-31 DIAGNOSIS — K429 Umbilical hernia without obstruction or gangrene: Secondary | ICD-10-CM | POA: Diagnosis present

## 2021-12-31 DIAGNOSIS — Z9049 Acquired absence of other specified parts of digestive tract: Secondary | ICD-10-CM | POA: Insufficient documentation

## 2021-12-31 DIAGNOSIS — Z87891 Personal history of nicotine dependence: Secondary | ICD-10-CM | POA: Insufficient documentation

## 2021-12-31 DIAGNOSIS — G8918 Other acute postprocedural pain: Secondary | ICD-10-CM | POA: Diagnosis not present

## 2021-12-31 DIAGNOSIS — R109 Unspecified abdominal pain: Secondary | ICD-10-CM | POA: Insufficient documentation

## 2021-12-31 DIAGNOSIS — K436 Other and unspecified ventral hernia with obstruction, without gangrene: Secondary | ICD-10-CM | POA: Insufficient documentation

## 2021-12-31 DIAGNOSIS — Z01818 Encounter for other preprocedural examination: Secondary | ICD-10-CM

## 2021-12-31 DIAGNOSIS — K5730 Diverticulosis of large intestine without perforation or abscess without bleeding: Secondary | ICD-10-CM | POA: Diagnosis not present

## 2021-12-31 DIAGNOSIS — R0602 Shortness of breath: Secondary | ICD-10-CM | POA: Insufficient documentation

## 2021-12-31 HISTORY — PX: VENTRAL HERNIA REPAIR: SHX424

## 2021-12-31 LAB — CBC WITH DIFFERENTIAL/PLATELET
Abs Immature Granulocytes: 0.06 10*3/uL (ref 0.00–0.07)
Basophils Absolute: 0 10*3/uL (ref 0.0–0.1)
Basophils Relative: 0 %
Eosinophils Absolute: 0.1 10*3/uL (ref 0.0–0.5)
Eosinophils Relative: 1 %
HCT: 41.8 % (ref 36.0–46.0)
Hemoglobin: 13.8 g/dL (ref 12.0–15.0)
Immature Granulocytes: 0 %
Lymphocytes Relative: 4 %
Lymphs Abs: 0.6 10*3/uL — ABNORMAL LOW (ref 0.7–4.0)
MCH: 28.6 pg (ref 26.0–34.0)
MCHC: 33 g/dL (ref 30.0–36.0)
MCV: 86.7 fL (ref 80.0–100.0)
Monocytes Absolute: 0.1 10*3/uL (ref 0.1–1.0)
Monocytes Relative: 0 %
Neutro Abs: 14.3 10*3/uL — ABNORMAL HIGH (ref 1.7–7.7)
Neutrophils Relative %: 95 %
Platelets: 357 10*3/uL (ref 150–400)
RBC: 4.82 MIL/uL (ref 3.87–5.11)
RDW: 14.6 % (ref 11.5–15.5)
WBC: 15.1 10*3/uL — ABNORMAL HIGH (ref 4.0–10.5)
nRBC: 0 % (ref 0.0–0.2)

## 2021-12-31 LAB — BASIC METABOLIC PANEL
Anion gap: 9 (ref 5–15)
BUN: 7 mg/dL (ref 6–20)
CO2: 23 mmol/L (ref 22–32)
Calcium: 8.5 mg/dL — ABNORMAL LOW (ref 8.9–10.3)
Chloride: 102 mmol/L (ref 98–111)
Creatinine, Ser: 0.86 mg/dL (ref 0.44–1.00)
GFR, Estimated: >60 mL/min (ref >60)
Glucose, Bld: 166 mg/dL — ABNORMAL HIGH (ref 70–99)
Potassium: 3.4 mmol/L — ABNORMAL LOW (ref 3.5–5.1)
Sodium: 134 mmol/L — ABNORMAL LOW (ref 135–145)

## 2021-12-31 LAB — POCT PREGNANCY, URINE: Preg Test, Ur: NEGATIVE

## 2021-12-31 SURGERY — REPAIR, HERNIA, VENTRAL, LAPAROSCOPIC
Anesthesia: General

## 2021-12-31 MED ORDER — ACETAMINOPHEN 500 MG PO TABS
1000.0000 mg | ORAL_TABLET | ORAL | Status: AC
  Administered 2021-12-31: 1000 mg via ORAL
  Filled 2021-12-31: qty 2

## 2021-12-31 MED ORDER — FENTANYL CITRATE (PF) 100 MCG/2ML IJ SOLN
INTRAMUSCULAR | Status: AC
  Filled 2021-12-31: qty 2

## 2021-12-31 MED ORDER — HYDRALAZINE HCL 20 MG/ML IJ SOLN
INTRAMUSCULAR | Status: AC
  Filled 2021-12-31: qty 1

## 2021-12-31 MED ORDER — ACETAMINOPHEN 500 MG PO TABS: 1000.0000 mg | ORAL_TABLET | Freq: Once | ORAL | Status: DC

## 2021-12-31 MED ORDER — BUPIVACAINE LIPOSOME 1.3 % IJ SUSP
INTRAMUSCULAR | Status: AC
  Filled 2021-12-31: qty 20

## 2021-12-31 MED ORDER — HYDROCODONE-ACETAMINOPHEN 5-325 MG PO TABS: 1.0000 | ORAL_TABLET | Freq: Four times a day (QID) | ORAL | 0 refills | Status: DC | PRN

## 2021-12-31 MED ORDER — KETAMINE HCL 10 MG/ML IJ SOLN
INTRAMUSCULAR | Status: DC | PRN
  Administered 2021-12-31 (×2): 10 mg via INTRAVENOUS
  Administered 2021-12-31: 30 mg via INTRAVENOUS

## 2021-12-31 MED ORDER — OXYCODONE HCL 5 MG PO TABS: 5.0000 mg | ORAL_TABLET | Freq: Once | ORAL | Status: AC | PRN

## 2021-12-31 MED ORDER — MIDAZOLAM HCL 5 MG/5ML IJ SOLN
INTRAMUSCULAR | Status: DC | PRN
  Administered 2021-12-31: 2 mg via INTRAVENOUS

## 2021-12-31 MED ORDER — HYDRALAZINE HCL 20 MG/ML IJ SOLN
INTRAMUSCULAR | Status: DC | PRN
  Administered 2021-12-31: 5 mg via INTRAVENOUS

## 2021-12-31 MED ORDER — CELECOXIB 200 MG PO CAPS
200.0000 mg | ORAL_CAPSULE | Freq: Once | ORAL | Status: AC
  Administered 2021-12-31: 200 mg via ORAL
  Filled 2021-12-31: qty 1

## 2021-12-31 MED ORDER — OXYCODONE HCL 5 MG PO TABS
ORAL_TABLET | ORAL | Status: AC
  Administered 2021-12-31: 5 mg via ORAL
  Filled 2021-12-31: qty 1

## 2021-12-31 MED ORDER — SODIUM CHLORIDE (PF) 0.9 % IJ SOLN
INTRAMUSCULAR | Status: AC
  Filled 2021-12-31: qty 10

## 2021-12-31 MED ORDER — DEXAMETHASONE SODIUM PHOSPHATE 10 MG/ML IJ SOLN
INTRAMUSCULAR | Status: DC | PRN
  Administered 2021-12-31: 10 mg via INTRAVENOUS

## 2021-12-31 MED ORDER — ONDANSETRON HCL 4 MG/2ML IJ SOLN
INTRAMUSCULAR | Status: DC | PRN
  Administered 2021-12-31: 4 mg via INTRAVENOUS

## 2021-12-31 MED ORDER — LIDOCAINE 2% (20 MG/ML) 5 ML SYRINGE
INTRAMUSCULAR | Status: DC | PRN
  Administered 2021-12-31: 1.5 mg/kg/h via INTRAVENOUS

## 2021-12-31 MED ORDER — FENTANYL CITRATE (PF) 100 MCG/2ML IJ SOLN
INTRAMUSCULAR | Status: DC | PRN
  Administered 2021-12-31: 50 ug via INTRAVENOUS
  Administered 2021-12-31: 100 ug via INTRAVENOUS
  Administered 2021-12-31: 50 ug via INTRAVENOUS

## 2021-12-31 MED ORDER — SCOPOLAMINE 1 MG/3DAYS TD PT72: 1.0000 | MEDICATED_PATCH | TRANSDERMAL | Status: DC

## 2021-12-31 MED ORDER — ONDANSETRON HCL 4 MG PO TABS: 4.0000 mg | ORAL_TABLET | Freq: Four times a day (QID) | ORAL | 0 refills | Status: DC

## 2021-12-31 MED ORDER — PROPOFOL 10 MG/ML IV BOLUS
INTRAVENOUS | Status: AC
  Filled 2021-12-31: qty 20

## 2021-12-31 MED ORDER — FENTANYL CITRATE PF 50 MCG/ML IJ SOSY
PREFILLED_SYRINGE | INTRAMUSCULAR | Status: AC
  Filled 2021-12-31: qty 2

## 2021-12-31 MED ORDER — ONDANSETRON HCL 4 MG/2ML IJ SOLN
4.0000 mg | Freq: Once | INTRAMUSCULAR | Status: AC
  Administered 2021-12-31: 4 mg via INTRAVENOUS
  Filled 2021-12-31: qty 2

## 2021-12-31 MED ORDER — PROPOFOL 10 MG/ML IV BOLUS
INTRAVENOUS | Status: DC | PRN
  Administered 2021-12-31: 200 mg via INTRAVENOUS

## 2021-12-31 MED ORDER — METOCLOPRAMIDE HCL 5 MG/ML IJ SOLN
10.0000 mg | Freq: Once | INTRAMUSCULAR | Status: AC
  Administered 2021-12-31: 10 mg via INTRAVENOUS
  Filled 2021-12-31: qty 2

## 2021-12-31 MED ORDER — SODIUM CHLORIDE (PF) 0.9 % IJ SOLN
INTRAMUSCULAR | Status: DC | PRN
  Administered 2021-12-31: 10 mL

## 2021-12-31 MED ORDER — DEXAMETHASONE SODIUM PHOSPHATE 10 MG/ML IJ SOLN
INTRAMUSCULAR | Status: AC
  Filled 2021-12-31: qty 1

## 2021-12-31 MED ORDER — FENTANYL CITRATE PF 50 MCG/ML IJ SOSY
25.0000 ug | PREFILLED_SYRINGE | INTRAMUSCULAR | Status: DC | PRN
  Administered 2021-12-31 (×2): 25 ug via INTRAVENOUS

## 2021-12-31 MED ORDER — MIDAZOLAM HCL 2 MG/2ML IJ SOLN
INTRAMUSCULAR | Status: AC
  Filled 2021-12-31: qty 2

## 2021-12-31 MED ORDER — PROMETHAZINE HCL 25 MG/ML IJ SOLN: 6.2500 mg | INTRAMUSCULAR | Status: DC | PRN

## 2021-12-31 MED ORDER — SCOPOLAMINE 1 MG/3DAYS TD PT72
1.0000 | MEDICATED_PATCH | TRANSDERMAL | Status: DC
  Administered 2021-12-31: 1.5 mg via TRANSDERMAL
  Filled 2021-12-31: qty 1

## 2021-12-31 MED ORDER — ROCURONIUM BROMIDE 10 MG/ML (PF) SYRINGE
PREFILLED_SYRINGE | INTRAVENOUS | Status: DC | PRN
  Administered 2021-12-31: 70 mg via INTRAVENOUS
  Administered 2021-12-31: 10 mg via INTRAVENOUS

## 2021-12-31 MED ORDER — BUPIVACAINE LIPOSOME 1.3 % IJ SUSP: 20.0000 mL | Freq: Once | INTRAMUSCULAR | Status: DC

## 2021-12-31 MED ORDER — DEXMEDETOMIDINE HCL IN NACL 200 MCG/50ML IV SOLN
INTRAVENOUS | Status: DC | PRN
  Administered 2021-12-31 (×3): 4 ug via INTRAVENOUS

## 2021-12-31 MED ORDER — SUGAMMADEX SODIUM 200 MG/2ML IV SOLN
INTRAVENOUS | Status: DC | PRN
  Administered 2021-12-31: 200 mg via INTRAVENOUS

## 2021-12-31 MED ORDER — ONDANSETRON HCL 4 MG/2ML IJ SOLN
INTRAMUSCULAR | Status: AC
  Filled 2021-12-31: qty 2

## 2021-12-31 MED ORDER — CHLORHEXIDINE GLUCONATE CLOTH 2 % EX PADS: 6.0000 | MEDICATED_PAD | Freq: Once | CUTANEOUS | Status: DC

## 2021-12-31 MED ORDER — KETAMINE HCL 50 MG/5ML IJ SOSY
PREFILLED_SYRINGE | INTRAMUSCULAR | Status: AC
  Filled 2021-12-31: qty 5

## 2021-12-31 MED ORDER — HYDROMORPHONE HCL 1 MG/ML IJ SOLN
1.0000 mg | Freq: Once | INTRAMUSCULAR | Status: AC
  Administered 2021-12-31: 1 mg via INTRAVENOUS
  Filled 2021-12-31: qty 1

## 2021-12-31 MED ORDER — LACTATED RINGERS IV SOLN: INTRAVENOUS | Status: DC

## 2021-12-31 MED ORDER — CEFAZOLIN SODIUM-DEXTROSE 2-4 GM/100ML-% IV SOLN
2.0000 g | INTRAVENOUS | Status: AC
  Administered 2021-12-31: 2 g via INTRAVENOUS
  Filled 2021-12-31: qty 100

## 2021-12-31 MED ORDER — OXYCODONE HCL 5 MG/5ML PO SOLN: 5.0000 mg | Freq: Once | ORAL | Status: AC | PRN

## 2021-12-31 MED ORDER — BUPIVACAINE LIPOSOME 1.3 % IJ SUSP
INTRAMUSCULAR | Status: DC | PRN
  Administered 2021-12-31: 20 mL

## 2021-12-31 MED ORDER — LIDOCAINE HCL 2 % IJ SOLN
INTRAMUSCULAR | Status: AC
  Filled 2021-12-31: qty 20

## 2021-12-31 SURGICAL SUPPLY — 48 items
ADH SKN CLS APL DERMABOND .7 (GAUZE/BANDAGES/DRESSINGS) ×1
APL PRP STRL LF DISP 70% ISPRP (MISCELLANEOUS) ×1
BAG COUNTER SPONGE SURGICOUNT (BAG) IMPLANT
BAG SPNG CNTER NS LX DISP (BAG)
BINDER ABDOMINAL 12 ML 46-62 (SOFTGOODS) IMPLANT
CABLE HIGH FREQUENCY MONO STRZ (ELECTRODE) IMPLANT
CHLORAPREP W/TINT 26 (MISCELLANEOUS) ×1 IMPLANT
COVER SURGICAL LIGHT HANDLE (MISCELLANEOUS) ×1 IMPLANT
DERMABOND ADVANCED .7 DNX12 (GAUZE/BANDAGES/DRESSINGS) ×1 IMPLANT
DEVICE SECURE STRAP 25 ABSORB (INSTRUMENTS) IMPLANT
DEVICE TROCAR PUNCTURE CLOSURE (ENDOMECHANICALS) ×1 IMPLANT
DISSECTOR BLUNT TIP ENDO 5MM (MISCELLANEOUS) IMPLANT
ELECT L-HOOK LAP 45CM DISP (ELECTROSURGICAL)
ELECT PENCIL ROCKER SW 15FT (MISCELLANEOUS) IMPLANT
ELECT REM PT RETURN 15FT ADLT (MISCELLANEOUS) ×1 IMPLANT
ELECTRODE L-HOOK LAP 45CM DISP (ELECTROSURGICAL) IMPLANT
GLOVE SURG LX STRL 8.0 MICRO (GLOVE) ×1 IMPLANT
GOWN STRL REUS W/ TWL XL LVL3 (GOWN DISPOSABLE) ×3 IMPLANT
GOWN STRL REUS W/TWL XL LVL3 (GOWN DISPOSABLE) ×3
IRRIG SUCT STRYKERFLOW 2 WTIP (MISCELLANEOUS)
IRRIGATION SUCT STRKRFLW 2 WTP (MISCELLANEOUS) IMPLANT
KIT BASIN OR (CUSTOM PROCEDURE TRAY) ×1 IMPLANT
KIT TURNOVER KIT A (KITS) IMPLANT
MARKER SKIN DUAL TIP RULER LAB (MISCELLANEOUS) ×1 IMPLANT
MESH VENTRALEX ST 8CM LRG (Mesh General) IMPLANT
NDL SPNL 22GX3.5 QUINCKE BK (NEEDLE) ×1 IMPLANT
NEEDLE SPNL 22GX3.5 QUINCKE BK (NEEDLE) ×1 IMPLANT
SCISSORS LAP 5X45 EPIX DISP (ENDOMECHANICALS) ×1 IMPLANT
SCRUB TECHNI CARE 4 OZ NO DYE (MISCELLANEOUS) ×1 IMPLANT
SET TUBE SMOKE EVAC HIGH FLOW (TUBING) ×1 IMPLANT
SHEARS HARMONIC ACE PLUS 45CM (MISCELLANEOUS) IMPLANT
SLEEVE ADV FIXATION 5X100MM (TROCAR) IMPLANT
SLEEVE Z-THREAD 5X100MM (TROCAR) IMPLANT
SPIKE FLUID TRANSFER (MISCELLANEOUS) ×1 IMPLANT
STAPLER VISISTAT 35W (STAPLE) IMPLANT
SUT MNCRL AB 4-0 PS2 18 (SUTURE) ×1 IMPLANT
SUT NOVA NAB DX-16 0-1 5-0 T12 (SUTURE) ×1 IMPLANT
SUT VIC AB 3-0 SH 8-18 (SUTURE) IMPLANT
SYR 10ML ECCENTRIC (SYRINGE) IMPLANT
TACKER 5MM HERNIA 3.5CML NAB (ENDOMECHANICALS) IMPLANT
TOWEL OR 17X26 10 PK STRL BLUE (TOWEL DISPOSABLE) ×1 IMPLANT
TOWEL OR NON WOVEN STRL DISP B (DISPOSABLE) ×1 IMPLANT
TRAY FOLEY MTR SLVR 16FR STAT (SET/KITS/TRAYS/PACK) IMPLANT
TRAY LAPAROSCOPIC (CUSTOM PROCEDURE TRAY) ×1 IMPLANT
TROCAR 11X100 Z THREAD (TROCAR) IMPLANT
TROCAR ADV FIXATION 5X100MM (TROCAR) IMPLANT
TROCAR Z-THREAD OPTICAL 5X100M (TROCAR) ×1 IMPLANT
YANKAUER SUCT BULB TIP NO VENT (SUCTIONS) ×1 IMPLANT

## 2021-12-31 NOTE — Transfer of Care (Signed)
Immediate Anesthesia Transfer of Care Note  Patient: Valerie Walter  Procedure(s) Performed: LAPAROSCOPIC ASSISTED VENTRAL HERNIA REPAIR  Patient Location: PACU  Anesthesia Type:General  Level of Consciousness: drowsy  Airway & Oxygen Therapy: Patient Spontanous Breathing and Patient connected to face mask oxygen  Post-op Assessment: Report given to RN and Post -op Vital signs reviewed and stable  Post vital signs: Reviewed and stable  Last Vitals:  Vitals Value Taken Time  BP 133/90 12/31/21 1449  Temp    Pulse 81 12/31/21 1450  Resp 29 12/31/21 1450  SpO2 99 % 12/31/21 1450  Vitals shown include unvalidated device data.  Last Pain:  Vitals:   12/31/21 0921  TempSrc:   PainSc: 0-No pain      Patients Stated Pain Goal: 3 (46/50/35 4656)  Complications: No notable events documented.

## 2021-12-31 NOTE — Anesthesia Procedure Notes (Signed)
Procedure Name: Intubation Date/Time: 12/31/2021 12:44 PM  Performed by: Rashod Gougeon D, CRNAPre-anesthesia Checklist: Patient identified, Emergency Drugs available, Suction available and Patient being monitored Patient Re-evaluated:Patient Re-evaluated prior to induction Oxygen Delivery Method: Circle system utilized Preoxygenation: Pre-oxygenation with 100% oxygen Induction Type: IV induction Ventilation: Mask ventilation without difficulty Laryngoscope Size: Mac and 4 Grade View: Grade I Tube type: Oral Tube size: 7.0 mm Number of attempts: 1 Airway Equipment and Method: Stylet and Oral airway Placement Confirmation: ETT inserted through vocal cords under direct vision, positive ETCO2 and breath sounds checked- equal and bilateral Secured at: 21 cm Tube secured with: Tape Dental Injury: Teeth and Oropharynx as per pre-operative assessment

## 2021-12-31 NOTE — ED Provider Notes (Signed)
[facility] DEPT Provider Note   CSN: [id number] Arrival date & time: 12/31/21  1801     History  Chief Complaint  Patient presents with   Shortness of Breath    Valerie Walter is a 41 y.o. female.  Patient is a 41 year old female with a history of thyroid disease, gallstones status postcholecystectomy and recent ventral hernia repair earlier today.  Patient reports that she was given pain medicine prior to leaving but as she had gotten in her car and she was going home and they had to turn around and come back because she started feeling very short of breath.  She reports it just feels like she cannot catch her breath and she is having severe abdominal pain.  She has had no vomiting or significant coughing.  For the hernia repair she was intubated but reported that right after the surgery she did not have any issues with breathing.  She denies a history of asthma.  The history is provided by the patient and medical records.  Shortness of Breath      Home Medications Prior to Admission medications   Medication Sig Start Date End Date Taking? Authorizing Provider  ondansetron (ZOFRAN) 4 MG tablet Take 1 tablet (4 mg total) by mouth every 6 (six) hours. 12/31/21  Yes Blanchie Dessert, MD  amoxicillin-clavulanate (AUGMENTIN) 875-125 MG tablet Take 1 tablet by mouth 2 (two) times daily. 12/11/21   Irene Shipper, MD  EUTHYROX 50 MCG tablet Take 1 tablet (50 mcg total) by mouth daily. Patient not taking: Reported on 12/05/2021 04/30/21   Eulogio Bear, NP  HYDROcodone-acetaminophen (NORCO/VICODIN) 5-325 MG tablet Take 1 tablet by mouth every 6 (six) hours as needed for moderate pain. 12/31/21   Johnathan Hausen, MD  cromolyn (NASALCROM) 5.2 MG/ACT nasal spray Place 1 spray into both nostrils 4 (four) times daily. 01/15/17 03/22/19  Zigmund Gottron, NP  medroxyPROGESTERone (DEPO-PROVERA) 150 MG/ML injection Inject 150 mg into the muscle every 3 (three)  months. Pt is having next dose on 02/16/16  03/22/19  [provider]      Allergies    Other    Review of Systems   Review of Systems  Respiratory:  Positive for shortness of breath.     Physical Exam Updated Vital Signs BP 128/83   Pulse 70   Temp 98.4 F (36.9 C)   Resp 19   Ht '5\' 3"'$  (1.6 m)   Wt 86.2 kg   LMP 11/26/2021 (Approximate) Comment: neg preg test  SpO2 95%   BMI 33.66 kg/m  Physical Exam Vitals and nursing note reviewed.  Constitutional:      General: She is not in acute distress.    Appearance: She is well-developed.  HENT:     Head: Normocephalic and atraumatic.  Eyes:     Pupils: Pupils are equal, round, and reactive to light.  Cardiovascular:     Rate and Rhythm: Normal rate and regular rhythm.     Heart sounds: Normal heart sounds. No murmur heard.    No friction rub.  Pulmonary:     Effort: Pulmonary effort is normal. Tachypnea present.     Breath sounds: Normal breath sounds. No wheezing or rales.  Abdominal:     General: Bowel sounds are normal. There is no distension.     Palpations: Abdomen is soft.     Tenderness: There is abdominal tenderness. There is no guarding or rebound.     Comments: Coralee Pesa is over  the abdomen and is quite snug.  When loosened patient has diffuse pain  Musculoskeletal:        General: No tenderness. Normal range of motion.     Right lower leg: No edema.     Left lower leg: No edema.     Comments: No edema  Skin:    General: Skin is warm and dry.     Findings: No rash.  Neurological:     Mental Status: She is alert and oriented to person, place, and time.     Cranial Nerves: No cranial nerve deficit.  Psychiatric:        Behavior: Behavior normal.     ED Results / Procedures / Treatments   Labs (all labs ordered are listed, but only abnormal results are displayed) Labs Reviewed  BASIC METABOLIC PANEL - Abnormal; Notable for the following components:      Result Value   Sodium 134 (*)    Potassium  3.4 (*)    Glucose, Bld 166 (*)    Calcium 8.5 (*)    All other components within normal limits  CBC WITH DIFFERENTIAL/PLATELET - Abnormal; Notable for the following components:   WBC 15.1 (*)    Neutro Abs 14.3 (*)    Lymphs Abs 0.6 (*)    All other components within normal limits    EKG None  Radiology DG Abdomen Acute W/Chest  Result Date: 12/31/2021 CLINICAL DATA:  Shortness of breath postoperative. Ventral hernia repair today. EXAM: DG ABDOMEN ACUTE WITH 1 VIEW CHEST COMPARISON:  Radiograph 03/02/2016 FINDINGS: Low lung volumes.The cardiomediastinal contours are normal. No evidence of pneumomediastinum. Streaky atelectasis in the right greater than left lung base. Pulmonary vasculature is normal. No consolidation, pleural effusion, or pneumothorax. No acute osseous abnormalities are seen. IMPRESSION: Low lung volumes with bibasilar atelectasis. Electronically Signed   By: Keith Rake M.D.   On: 12/31/2021 19:40    Procedures Procedures    Medications Ordered in ED Medications  HYDROmorphone (DILAUDID) injection 1 mg (1 mg Intravenous Given 12/31/21 2016)  ondansetron (ZOFRAN) injection 4 mg (4 mg Intravenous Given 12/31/21 2016)  metoCLOPramide (REGLAN) injection 10 mg (10 mg Intravenous Given 12/31/21 2247)    ED Course/ Medical Decision Making/ A&P                           Medical Decision Making Risk Prescription drug management.   Pt presenting today with a complaint that caries a high risk for morbidity and mortality.  Patient with recent ventral hernia repair earlier today presenting with complaints of shortness of breath.  She is having severe abdominal pain but reports she just cannot catch her breath.  This started right after she left the hospital.  She does not have a history of asthma denies any issues with her throat.  Her breath sounds are clear bilaterally but she is tachypneic.  Sats are 100% on room air.  Patient does have a binder on which is quite  snug and up around her ribs.  Wondering if this restrictive device is causing some of her shortness of breath.  It was released but also concerned that maybe pain is adding into her symptoms.  She was given pain control.  I have independently visualized and interpreted pt's images today.  X-ray today with lower lung volumes but no evidence of pneumothorax.  No free air.  I independently interpreted patient's labs and CBC does show a leukocytosis of 15,000 but  most likely related to acute phase reaction from surgery today, BMP without acute findings.  11:57 PM Patient was improved after pain medication and loosening the binder.  She ate a little bit but felt nauseated.  However she overall feels better.  Sats have been greater than 95% on room air.  Feel that she is stable for discharge home.  She was given an additional prescription for nausea medication.  She and her daughter were given return precautions.        Final Clinical Impression(s) / ED Diagnoses Final diagnoses:  SOB (shortness of breath)  Acute post-operative pain    Rx / DC Orders ED Discharge Orders          Ordered    ondansetron (ZOFRAN) 4 MG tablet  Every 6 hours        12/31/21 2322              Blanchie Dessert, MD 12/31/21 2357

## 2021-12-31 NOTE — Op Note (Signed)
Valerie Walter  [birth year]   12/31/2021    PCP:  Merryl Hacker, No   Surgeon: Kaylyn Lim, MD, FACS  Asst:  Winfield Rast, MD  Anes:  general  Preop Dx: Supraumbilical hernia Postop Dx: Incarcerated supraumbilical hernia containg omental fat  Procedure: Lap assisted ventral hernia repair with 3.2 inch Ventralex mesh Location Surgery: [facility] Complications: None noted  EBL:   10 cc  Drains: none  Description of Procedure:  The patient was taken to OR 1 .  After anesthesia was administered and the patient was prepped  with chloroprep  and a timeout was performed.  Access was obtained through the left upper quadrant with a 5 mm Optiview.  She had very tough fascia.  She had a previous laparoscopic cholecystectomy.  She had marked adhesive reaction in the upper midline to an incarcerated supraumbilical hernia.  CT scan it showed this likely contained mainly incarcerated omentum.  It did not suggest any bowel was in it.  With second port was placed in the left lower quadrant and subsequently port was placed in the right lower quadrant.  With a's and the help of the harmonic scalpel and Prestige traction were able to remove a tightly adherent incarcerated hernia.  Once that was achieved and hemostasis was present we are able to direct our attention to the hernia for anteriorly.  A transverse incision was made over the hernia and most of the sac could be stripped out.  The remaining sac was then removed with Bovie.  The resultant defect was almost 2 finger breaths in diameter.  We felt this could be taken care of with a largest Ventralex that has strap which just 3.2 inches.  This was placed in the middle of this defect and secured with 4 sutures and is superiorly inferiorly and laterally on either side.  Examination with the laparoscope then was performed and it was further secured with the secure strap absorbable stapler.  The omentum had been examined and there was nothing that appeared to be  not expected.  It was just a very dense hard mass of fat probably secondary to incarceration with periodic ischemia hence it had been tender at times clinically.  A block with Exparel was performed as well as the midline incision was injected.  Following irrigation and closed with 3-0 Vicryl and 4-0 Monocryl Dermabond was applied.  The patient tolerated the procedure well and was taken to the PACU in stable condition.     Matt B. Hassell Done, [place], [facility], [place]

## 2021-12-31 NOTE — Anesthesia Preprocedure Evaluation (Addendum)
Anesthesia Evaluation  Patient identified by MRN, date of birth, ID band Patient awake    Reviewed: Allergy & Precautions, NPO status , Patient's Chart, lab work & pertinent test results  History of Anesthesia Complications Negative for: history of anesthetic complications  Airway Mallampati: II  TM Distance: >3 FB Neck ROM: Full    Dental  (+) Dental Advisory Given   Pulmonary former smoker   Pulmonary exam normal        Cardiovascular negative cardio ROS Normal cardiovascular exam     Neuro/Psych negative neurological ROS  negative psych ROS   GI/Hepatic negative GI ROS, Neg liver ROS,,,  Endo/Other  Hypothyroidism    Renal/GU negative Renal ROS     Musculoskeletal negative musculoskeletal ROS (+)    Abdominal   Peds  Hematology negative hematology ROS (+)   Anesthesia Other Findings   Reproductive/Obstetrics                             Anesthesia Physical Anesthesia Plan  ASA: 2  Anesthesia Plan: General   Post-op Pain Management: Tylenol PO (pre-op)* and Celebrex PO (pre-op)*   Induction: Intravenous  PONV Risk Score and Plan: 3 and Treatment may vary due to age or medical condition, Ondansetron, Dexamethasone, Midazolam and Scopolamine patch - Pre-op  Airway Management Planned: Oral ETT  Additional Equipment: None  Intra-op Plan:   Post-operative Plan: Extubation in OR  Informed Consent: I have reviewed the patients History and Physical, chart, labs and discussed the procedure including the risks, benefits and alternatives for the proposed anesthesia with the patient or authorized representative who has indicated his/her understanding and acceptance.     Dental advisory given  Plan Discussed with: CRNA and Anesthesiologist  Anesthesia Plan Comments:        Anesthesia Quick Evaluation

## 2021-12-31 NOTE — Anesthesia Postprocedure Evaluation (Signed)
Anesthesia Post Note  Patient: Valerie Walter  Procedure(s) Performed: LAPAROSCOPIC ASSISTED VENTRAL HERNIA REPAIR     Patient location during evaluation: PACU Anesthesia Type: General Level of consciousness: awake and alert Pain management: pain level controlled Vital Signs Assessment: post-procedure vital signs reviewed and stable Respiratory status: spontaneous breathing, nonlabored ventilation and respiratory function stable Cardiovascular status: stable and blood pressure returned to baseline Anesthetic complications: no   No notable events documented.  Last Vitals:  Vitals:   12/31/21 1500 12/31/21 1515  BP: (!) 140/89 (!) 139/92  Pulse: 76 75  Resp: 12 14  Temp:    SpO2: 96% 99%    Last Pain:  Vitals:   12/31/21 1622  TempSrc:   PainSc: [place]

## 2021-12-31 NOTE — ED Provider Triage Note (Signed)
Emergency Medicine Provider Triage Evaluation Note  Valerie Walter , a 41 y.o. female  was evaluated in triage.  Pt complains of shortness of breath.  Patient had a laparoscopic ventral hernia repair earlier today.  She states that while traveling home from the surgical center she became significantly short of breath and came straight to the emergency room.  She also endorses abdominal pain believes this is secondary to having surgery earlier today.  She denies chest pain at this time.  Dr. Johnathan Hausen performed the surgery today.  Review of Systems  Positive: As above Negative: As above  Physical Exam  BP (!) 141/89 (BP Location: Left Arm)   Pulse 71   Temp 99.3 F (37.4 C) (Oral)   Resp (!) 24   LMP 11/26/2021   SpO2 100%  Gen:   Awake Resp:  Increased effort, lungs CTA bilaterally MSK:   Moves extremities without difficulty  Other:    Medical Decision Making  Medically screening exam initiated at 6:50 PM.  Appropriate orders placed.  Valerie Walter was informed that the remainder of the evaluation will be completed by another provider, this initial triage assessment does not replace that evaluation, and the importance of remaining in the ED until their evaluation is complete.     Dorothyann Peng, PA-C 12/31/21 1851

## 2021-12-31 NOTE — Discharge Instructions (Signed)
Test looked okay today.  I think the binder was on too tight and that with the combination of the pain was making you short of breath.  Your oxygen levels have been normal here.  You are going to have to eat a very bland diet with a little bit at a time over the next 1 to 2 days until your system goes back to normal.  If you have any issues call Dr. Hassell Done with the surgical team.  If you start having recurrence of the inability to catch her breath call 911.

## 2021-12-31 NOTE — ED Triage Notes (Signed)
Patient reports that she had a ventral hernia repair today. Patient c/o SOB, headache, and dizziness. Patient also c/o abdominal pain.

## 2021-12-31 NOTE — Interval H&P Note (Signed)
History and Physical Interval Note:  47/15/9539 67:28 PM  Valerie Walter  has presented today for surgery, with the diagnosis of PAINFUL INCREASING VENTRAL HERNIA.  The various methods of treatment have been discussed with the patient and family. After consideration of risks, benefits and other options for treatment, the patient has consented to  Procedure(s): [place] (N/A) as a surgical intervention.  The patient's history has been reviewed, patient examined, no change in status, stable for surgery.  I have reviewed the patient's chart and labs.  Questions were answered to the patient's satisfaction.     Pedro Earls

## 2021-12-31 NOTE — ED Notes (Signed)
Pt wheeled from ed transferred to car. Pt verbalizes understanding of discharge instructions. IV removed.  Pt has access to home. Pt dressed for discharge.

## 2022-01-01 ENCOUNTER — Encounter (HOSPITAL_COMMUNITY): Payer: Self-pay | Admitting: Surgery

## 2022-01-02 ENCOUNTER — Encounter (HOSPITAL_COMMUNITY): Payer: Self-pay | Admitting: Surgery

## 2022-05-01 ENCOUNTER — Ambulatory Visit: Payer: Medicaid Other | Admitting: Obstetrics and Gynecology

## 2022-06-04 ENCOUNTER — Ambulatory Visit: Payer: Medicaid Other | Admitting: Obstetrics and Gynecology

## 2023-12-08 ENCOUNTER — Encounter (HOSPITAL_BASED_OUTPATIENT_CLINIC_OR_DEPARTMENT_OTHER): Payer: Self-pay | Admitting: *Deleted

## 2023-12-08 ENCOUNTER — Other Ambulatory Visit: Payer: Self-pay

## 2023-12-08 ENCOUNTER — Emergency Department (HOSPITAL_BASED_OUTPATIENT_CLINIC_OR_DEPARTMENT_OTHER): Payer: MEDICAID

## 2023-12-08 ENCOUNTER — Inpatient Hospital Stay (HOSPITAL_BASED_OUTPATIENT_CLINIC_OR_DEPARTMENT_OTHER)
Admission: EM | Admit: 2023-12-08 | Discharge: 2023-12-10 | DRG: 872 | Disposition: A | Discharge status: Home / Self Care | Payer: MEDICAID | Attending: Surgery | Admitting: Surgery

## 2023-12-08 DIAGNOSIS — Z91018 Allergy to other foods: Secondary | ICD-10-CM | POA: Diagnosis not present

## 2023-12-08 DIAGNOSIS — E039 Hypothyroidism, unspecified: Secondary | ICD-10-CM | POA: Diagnosis present

## 2023-12-08 DIAGNOSIS — A419 Sepsis, unspecified organism: Secondary | ICD-10-CM | POA: Diagnosis present

## 2023-12-08 DIAGNOSIS — Z8269 Family history of other diseases of the musculoskeletal system and connective tissue: Secondary | ICD-10-CM | POA: Diagnosis not present

## 2023-12-08 DIAGNOSIS — L0231 Cutaneous abscess of buttock: Secondary | ICD-10-CM | POA: Diagnosis present

## 2023-12-08 DIAGNOSIS — Z803 Family history of malignant neoplasm of breast: Secondary | ICD-10-CM | POA: Diagnosis not present

## 2023-12-08 DIAGNOSIS — L03317 Cellulitis of buttock: Secondary | ICD-10-CM | POA: Diagnosis present

## 2023-12-08 DIAGNOSIS — E872 Acidosis, unspecified: Secondary | ICD-10-CM | POA: Diagnosis present

## 2023-12-08 DIAGNOSIS — Z833 Family history of diabetes mellitus: Secondary | ICD-10-CM

## 2023-12-08 DIAGNOSIS — Z87891 Personal history of nicotine dependence: Secondary | ICD-10-CM

## 2023-12-08 DIAGNOSIS — Z8249 Family history of ischemic heart disease and other diseases of the circulatory system: Secondary | ICD-10-CM | POA: Diagnosis not present

## 2023-12-08 DIAGNOSIS — L0591 Pilonidal cyst without abscess: Secondary | ICD-10-CM | POA: Diagnosis not present

## 2023-12-08 DIAGNOSIS — K611 Rectal abscess: Secondary | ICD-10-CM | POA: Diagnosis present

## 2023-12-08 DIAGNOSIS — L0501 Pilonidal cyst with abscess: Secondary | ICD-10-CM | POA: Diagnosis present

## 2023-12-08 DIAGNOSIS — Z1152 Encounter for screening for COVID-19: Secondary | ICD-10-CM

## 2023-12-08 LAB — COMPREHENSIVE METABOLIC PANEL WITH GFR
ALT: 19 U/L (ref 0–44)
AST: 20 U/L (ref 15–41)
Albumin: 4.2 g/dL (ref 3.5–5.0)
Alkaline Phosphatase: 79 U/L (ref 38–126)
Anion gap: 16 — ABNORMAL HIGH (ref 5–15)
BUN: 7 mg/dL (ref 6–20)
CO2: 18 mmol/L — ABNORMAL LOW (ref 22–32)
Calcium: 9 mg/dL (ref 8.9–10.3)
Chloride: 104 mmol/L (ref 98–111)
Creatinine, Ser: 0.8 mg/dL (ref 0.44–1.00)
GFR, Estimated: >60 mL/min (ref >60)
Glucose, Bld: 165 mg/dL — ABNORMAL HIGH (ref 70–99)
Potassium: 3.6 mmol/L (ref 3.5–5.1)
Sodium: 137 mmol/L (ref 135–145)
Total Bilirubin: 0.8 mg/dL (ref 0.0–1.2)
Total Protein: 7.8 g/dL (ref 6.5–8.1)

## 2023-12-08 LAB — URINALYSIS, W/ REFLEX TO CULTURE (INFECTION SUSPECTED)
Bilirubin Urine: NEGATIVE
Glucose, UA: NEGATIVE mg/dL
Ketones, ur: NEGATIVE mg/dL
Leukocytes,Ua: NEGATIVE
Nitrite: NEGATIVE
Protein, ur: 30 mg/dL — AB
Specific Gravity, Urine: 1.02 (ref 1.005–1.030)
pH: 5.5 (ref 5.0–8.0)

## 2023-12-08 LAB — RESP PANEL BY RT-PCR (RSV, FLU A&B, COVID)  RVPGX2
Influenza A by PCR: NEGATIVE
Influenza B by PCR: NEGATIVE
Resp Syncytial Virus by PCR: NEGATIVE
SARS Coronavirus 2 by RT PCR: NEGATIVE

## 2023-12-08 LAB — CBC WITH DIFFERENTIAL/PLATELET
Abs Immature Granulocytes: 0.06 K/uL (ref 0.00–0.07)
Basophils Absolute: 0 K/uL (ref 0.0–0.1)
Basophils Relative: 0 %
Eosinophils Absolute: 0 K/uL (ref 0.0–0.5)
Eosinophils Relative: 0 %
HCT: 39.8 % (ref 36.0–46.0)
Hemoglobin: 13.1 g/dL (ref 12.0–15.0)
Immature Granulocytes: 0 %
Lymphocytes Relative: 12 %
Lymphs Abs: 1.7 K/uL (ref 0.7–4.0)
MCH: 28.4 pg (ref 26.0–34.0)
MCHC: 32.9 g/dL (ref 30.0–36.0)
MCV: 86.1 fL (ref 80.0–100.0)
Monocytes Absolute: 0.4 K/uL (ref 0.1–1.0)
Monocytes Relative: 3 %
Neutro Abs: 12.1 K/uL — ABNORMAL HIGH (ref 1.7–7.7)
Neutrophils Relative %: 85 %
Platelets: 306 K/uL (ref 150–400)
RBC: 4.62 MIL/uL (ref 3.87–5.11)
RDW: 14.6 % (ref 11.5–15.5)
WBC: 14.4 K/uL — ABNORMAL HIGH (ref 4.0–10.5)
nRBC: 0 % (ref 0.0–0.2)

## 2023-12-08 LAB — LACTIC ACID, PLASMA
Lactic Acid, Venous: 2.9 mmol/L (ref 0.5–1.9)
Lactic Acid, Venous: 2.9 mmol/L (ref 0.5–1.9)

## 2023-12-08 LAB — PROTIME-INR
INR: 1.1 (ref 0.8–1.2)
Prothrombin Time: 14.6 s (ref 11.4–15.2)

## 2023-12-08 LAB — PREGNANCY, URINE: Preg Test, Ur: NEGATIVE

## 2023-12-08 MED ORDER — LIDOCAINE 4 % EX CREA
TOPICAL_CREAM | Freq: Once | CUTANEOUS | Status: AC
  Administered 2023-12-08: 1 via TOPICAL
  Filled 2023-12-08: qty 5

## 2023-12-08 MED ORDER — ONDANSETRON HCL 4 MG/2ML IJ SOLN
4.0000 mg | Freq: Four times a day (QID) | INTRAMUSCULAR | Status: DC | PRN
  Administered 2023-12-10: 4 mg via INTRAVENOUS
  Filled 2023-12-08: qty 2

## 2023-12-08 MED ORDER — MORPHINE SULFATE (PF) 2 MG/ML IV SOLN
1.0000 mg | INTRAVENOUS | Status: DC | PRN
  Administered 2023-12-09 (×3): 1 mg via INTRAVENOUS
  Filled 2023-12-08 (×3): qty 1

## 2023-12-08 MED ORDER — METRONIDAZOLE 500 MG/100ML IV SOLN
500.0000 mg | Freq: Once | INTRAVENOUS | Status: AC
  Administered 2023-12-08: 500 mg via INTRAVENOUS
  Filled 2023-12-08: qty 100

## 2023-12-08 MED ORDER — LACTATED RINGERS IV SOLN: INTRAVENOUS | Status: DC

## 2023-12-08 MED ORDER — SODIUM CHLORIDE 0.9% FLUSH
3.0000 mL | Freq: Two times a day (BID) | INTRAVENOUS | Status: DC
  Administered 2023-12-08 – 2023-12-09 (×2): 3 mL via INTRAVENOUS

## 2023-12-08 MED ORDER — SODIUM CHLORIDE 0.9 % IV SOLN: 250.0000 mL | INTRAVENOUS | Status: AC | PRN

## 2023-12-08 MED ORDER — DOCUSATE SODIUM 100 MG PO CAPS
100.0000 mg | ORAL_CAPSULE | Freq: Two times a day (BID) | ORAL | Status: DC
  Administered 2023-12-08 – 2023-12-10 (×3): 100 mg via ORAL
  Filled 2023-12-08 (×4): qty 1

## 2023-12-08 MED ORDER — LACTATED RINGERS IV BOLUS (SEPSIS)
1000.0000 mL | Freq: Once | INTRAVENOUS | Status: AC
  Administered 2023-12-08: 1000 mL via INTRAVENOUS

## 2023-12-08 MED ORDER — IOHEXOL 300 MG/ML  SOLN
100.0000 mL | Freq: Once | INTRAMUSCULAR | Status: AC | PRN
  Administered 2023-12-08: 100 mL via INTRAVENOUS

## 2023-12-08 MED ORDER — ONDANSETRON HCL 4 MG PO TABS: 4.0000 mg | ORAL_TABLET | Freq: Four times a day (QID) | ORAL | Status: DC | PRN

## 2023-12-08 MED ORDER — PIPERACILLIN-TAZOBACTAM 3.375 G IVPB
3.3750 g | Freq: Three times a day (TID) | INTRAVENOUS | Status: DC
  Administered 2023-12-09 (×2): 3.375 g via INTRAVENOUS
  Filled 2023-12-08 (×3): qty 50

## 2023-12-08 MED ORDER — HYDROCODONE-ACETAMINOPHEN 5-325 MG PO TABS
1.0000 | ORAL_TABLET | Freq: Four times a day (QID) | ORAL | Status: DC | PRN
  Administered 2023-12-09: 1 via ORAL
  Filled 2023-12-08: qty 1

## 2023-12-08 MED ORDER — VANCOMYCIN HCL 750 MG/150ML IV SOLN
750.0000 mg | Freq: Two times a day (BID) | INTRAVENOUS | Status: DC
  Administered 2023-12-09: 750 mg via INTRAVENOUS
  Filled 2023-12-08 (×2): qty 150

## 2023-12-08 MED ORDER — ENOXAPARIN SODIUM 40 MG/0.4ML IJ SOSY
40.0000 mg | PREFILLED_SYRINGE | INTRAMUSCULAR | Status: DC
  Administered 2023-12-09 – 2023-12-10 (×2): 40 mg via SUBCUTANEOUS
  Filled 2023-12-08 (×2): qty 0.4

## 2023-12-08 MED ORDER — ACETAMINOPHEN 650 MG RE SUPP: 650.0000 mg | Freq: Four times a day (QID) | RECTAL | Status: DC | PRN

## 2023-12-08 MED ORDER — VANCOMYCIN HCL IN DEXTROSE 1-5 GM/200ML-% IV SOLN
1000.0000 mg | Freq: Once | INTRAVENOUS | Status: AC
  Administered 2023-12-08: 1000 mg via INTRAVENOUS
  Filled 2023-12-08: qty 200

## 2023-12-08 MED ORDER — ACETAMINOPHEN 325 MG PO TABS: 650.0000 mg | ORAL_TABLET | Freq: Four times a day (QID) | ORAL | Status: DC | PRN

## 2023-12-08 MED ORDER — SODIUM CHLORIDE 0.9% FLUSH: 3.0000 mL | INTRAVENOUS | Status: DC | PRN

## 2023-12-08 MED ORDER — ONDANSETRON HCL 4 MG/2ML IJ SOLN
4.0000 mg | Freq: Once | INTRAMUSCULAR | Status: AC
  Administered 2023-12-08: 4 mg via INTRAVENOUS
  Filled 2023-12-08: qty 2

## 2023-12-08 MED ORDER — HYDROMORPHONE HCL 1 MG/ML IJ SOLN
1.0000 mg | Freq: Once | INTRAMUSCULAR | Status: AC
  Administered 2023-12-08: 1 mg via INTRAVENOUS
  Filled 2023-12-08: qty 1

## 2023-12-08 MED ORDER — ACETAMINOPHEN 325 MG PO TABS
650.0000 mg | ORAL_TABLET | Freq: Once | ORAL | Status: AC
Start: 2023-12-08 — End: 2023-12-08
  Administered 2023-12-08: 650 mg via ORAL
  Filled 2023-12-08: qty 2

## 2023-12-08 MED ORDER — SODIUM CHLORIDE 0.9 % IV SOLN
2.0000 g | Freq: Once | INTRAVENOUS | Status: AC
  Administered 2023-12-08: 2 g via INTRAVENOUS
  Filled 2023-12-08: qty 12.5

## 2023-12-08 MED ORDER — SODIUM CHLORIDE 0.9% FLUSH
3.0000 mL | Freq: Two times a day (BID) | INTRAVENOUS | Status: DC
  Administered 2023-12-08 – 2023-12-10 (×4): 3 mL via INTRAVENOUS

## 2023-12-08 MED ORDER — LIDOCAINE HCL 2 % IJ SOLN
20.0000 mL | Freq: Once | INTRAMUSCULAR | Status: AC
  Administered 2023-12-08: 400 mg
  Filled 2023-12-08: qty 20

## 2023-12-08 NOTE — ED Notes (Signed)
 ED Provider at bedside.

## 2023-12-08 NOTE — Sepsis Progress Note (Signed)
 [facility] tracking per protocol.

## 2023-12-08 NOTE — ED Notes (Signed)
 [facility] is at bedside now for pick up amd transport to [facility].

## 2023-12-08 NOTE — Plan of Care (Signed)
 Plan of Care Note for accepted transfer   Patient name: Valerie Walter FMW:[id number] DOB: [birth year]  Facility requesting transfer: [facility] ED Requesting Provider: Dr. 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  Febrile and tachycardic on arrival.  Not hypotensive.  WBC count 14.4, lactate 2.9> 2.9.   CT of pelvis with contrast showing: IMPRESSION: 1. Rounded fluid collection just beneath the skin surface in the region of the superior gluteal fold measuring 2.6 x 2.6 x 2.3 cm, worrisome for phlegmon/abscess. 2. Subcutaneous edema in the region of the gluteal fold concerning for cellulitis.  EP PA currently in the process of doing bedside drainage of the abscess.  Patient was given vancomycin , cefepime, metronidazole , 2 L IV fluid boluses, and analgesics.    Plan of care: The patient is accepted for admission to Telemetry unit at [facility].  [facility] will assume care on arrival to accepting facility. Until arrival, care as per EDP. However, [facility] available 24/7 for questions and assistance.  Check [email or web address] for on-call coverage.  Nursing staff, please call [facility] Admits & Consults System-Wide number under Amion on patient's arrival so appropriate admitting provider can evaluate the pt.

## 2023-12-08 NOTE — ED Triage Notes (Addendum)
 Pt is here for evaluation of evaluation of boil in gluteal fold which has been increasing in pain since yesterday.  Not draining.  Pt had same before. Pain is severe. Pt has had chills with this. Pt took 800mg  Ibuprofen  2 hours pta.

## 2023-12-08 NOTE — ED Notes (Signed)
 Pt given saltines, edp aware.

## 2023-12-08 NOTE — ED Provider Notes (Signed)
 [place] [facility] Provider Note   CSN: [id number] Arrival date & time: 12/08/23  1551     Patient presents with: Pilonidal Cyst   Valerie Walter is a 43 y.o. female who has a prior history of pilonidal cyst however states this was about 7 to 8 years ago.  States that she has a recurrence of this which has been getting worse since yesterday, denies have any drainage in the same area, pain is severe with 10/10 pain.  She further endorses fever and chills over the last 2 days, unable to tolerate seated position secondary to pain, states that it is mainly on the right sided gluteal cleft however extends up into the superior aspect of the gluteal cleft.  Has taken ibuprofen  without effect.   HPI     Prior to Admission medications   Medication Sig Start Date End Date Taking? Authorizing Provider  amoxicillin -clavulanate (AUGMENTIN ) 875-125 MG tablet Take 1 tablet by mouth 2 (two) times daily. Patient not taking: Reported on 12/08/2023 12/11/21   Abran Norleen SAILOR, MD  EUTHYROX  50 MCG tablet Take 1 tablet (50 mcg total) by mouth daily. Patient not taking: Reported on 12/05/2021 04/30/21   Chandra Harlene LABOR, NP  HYDROcodone -acetaminophen  (NORCO/VICODIN) 5-325 MG tablet Take 1 tablet by mouth every 6 (six) hours as needed for moderate pain. Patient not taking: Reported on 12/08/2023 12/31/21   Gladis Cough, MD  ondansetron  (ZOFRAN ) 4 MG tablet Take 1 tablet (4 mg total) by mouth every 6 (six) hours. Patient not taking: Reported on 12/08/2023 12/31/21   Doretha Folks, MD  cromolyn  (NASALCROM ) 5.2 MG/ACT nasal spray Place 1 spray into both nostrils 4 (four) times daily. 01/15/17 03/22/19  Burky, Natalie B, NP  medroxyPROGESTERone (DEPO-PROVERA) 150 MG/ML injection Inject 150 mg into the muscle every 3 (three) months. Pt is having next dose on 02/16/16  03/22/19  [provider]    Allergies: Other    Review of Systems  Constitutional:   Positive for activity change, appetite change, chills and fever.  Skin:  Positive for rash and wound.  All other systems reviewed and are negative.   Updated Vital Signs BP (!) 146/73 (BP Location: Right Arm)   Pulse 63   Temp 98.1 F (36.7 C) (Oral)   Resp 16   LMP 11/13/2023 (Approximate)   SpO2 99%   Physical Exam Vitals and nursing note reviewed.  Constitutional:      General: She is awake. She is not in acute distress.    Appearance: Normal appearance. She is not toxic-appearing.  HENT:     Head: Normocephalic and atraumatic.     Mouth/Throat:     Mouth: Mucous membranes are moist.     Pharynx: Oropharynx is clear.  Eyes:     Extraocular Movements: Extraocular movements intact.     Conjunctiva/sclera: Conjunctivae normal.     Pupils: Pupils are equal, round, and reactive to light.  Cardiovascular:     Rate and Rhythm: Normal rate and regular rhythm.     Pulses: Normal pulses.     Heart sounds: Normal heart sounds. No murmur heard.    No friction rub. No gallop.  Pulmonary:     Effort: Pulmonary effort is normal.     Breath sounds: Normal breath sounds.  Abdominal:     General: Abdomen is flat. Bowel sounds are normal.     Palpations: Abdomen is soft.  Musculoskeletal:        General: Normal range of  motion.     Cervical back: Normal range of motion and neck supple.     Right lower leg: No edema.     Left lower leg: No edema.  Skin:    General: Skin is warm and dry.     Capillary Refill: Capillary refill takes less than 2 seconds.     Findings: Abscess and lesion present.         Comments: Firm and tender nodule appreciated to the superior aspect of gluteal cleft along with tenderness and firmness to the superior aspect of the left gluteus, extending over into the right side of the gluteal cleft as well.  No purulent drainage appreciated.  Neurological:     General: No focal deficit present.     Mental Status: She is alert and oriented to person, place, and  time. Mental status is at baseline.     GCS: GCS eye subscore is 4. GCS verbal subscore is 5. GCS motor subscore is 6.  Psychiatric:        Mood and Affect: Mood normal.        Behavior: Behavior is cooperative.     (all labs ordered are listed, but only abnormal results are displayed) Labs Reviewed  LACTIC ACID, PLASMA - Abnormal; Notable for the following components:      Result Value   Lactic Acid, Venous 2.9 (*)    All other components within normal limits  LACTIC ACID, PLASMA - Abnormal; Notable for the following components:   Lactic Acid, Venous 2.9 (*)    All other components within normal limits  COMPREHENSIVE METABOLIC PANEL WITH GFR - Abnormal; Notable for the following components:   CO2 18 (*)    Glucose, Bld 165 (*)    Anion gap 16 (*)    All other components within normal limits  CBC WITH DIFFERENTIAL/PLATELET - Abnormal; Notable for the following components:   WBC 14.4 (*)    Neutro Abs 12.1 (*)    All other components within normal limits  URINALYSIS, W/ REFLEX TO CULTURE (INFECTION SUSPECTED) - Abnormal; Notable for the following components:   Hgb urine dipstick TRACE (*)    Protein, ur 30 (*)    Bacteria, UA FEW (*)    All other components within normal limits  RESP PANEL BY RT-PCR (RSV, FLU A&B, COVID)  RVPGX2  CULTURE, BLOOD (ROUTINE X 2)  CULTURE, BLOOD (ROUTINE X 2)  AEROBIC/ANAEROBIC CULTURE W GRAM STAIN (SURGICAL/DEEP WOUND)  PREGNANCY, URINE  PROTIME-INR    EKG: EKG Interpretation Date/Time:  Monday December 08 2023 18:00:32 EDT Ventricular Rate:  87 PR Interval:  162 QRS Duration:  96 QT Interval:  359 QTC Calculation: 432 R Axis:   -41  Text Interpretation: Sinus rhythm Left axis deviation Confirmed by Ruthe Cornet [phone]) on 12/08/2023 6:11:33 PM  Radiology: CT PELVIS W CONTRAST Result Date: 12/08/2023 EXAM: CT Pelvis, With IV Contrast 12/08/2023 06:39:09 PM TECHNIQUE: Axial images were acquired through the pelvis with IV contrast.  Reformatted images were reviewed. Automated exposure control, iterative reconstruction, and/or weight based adjustment of the mA/kV was utilized to reduce the radiation dose to as low as reasonably achievable. 100 mL iohexol  (OMNIPAQUE ) 300 MG/ML solution. COMPARISON: CT abdomen and pelvis 11/24/2010. CLINICAL HISTORY: FINDINGS: BONES: No acute fracture or focal osseous lesion. JOINTS: No dislocation. The joint spaces are normal. SOFT TISSUES: There is subcutaneous edema in the region of the gluteal fold bilaterally. There is a rounded fluid collection just beneath the skin surface in the region  of the superior gluteal fold at the level of the tip of the coccyx measuring 2.6 x 2.6 x 2.3 cm. There is a small fat-containing umbilical hernia. INTRAPELVIC CONTENTS: There is diffuse colonic diverticulosis. The appendix appears normal. Very slightly lobulated uterine contour. At least 1 exophytic fibroid is present anteriorly measuring 16 mm. IMPRESSION: 1. Rounded fluid collection just beneath the skin surface in the region of the superior gluteal fold measuring 2.6 x 2.6 x 2.3 cm, worrisome for phlegmon/abscess. 2. Subcutaneous edema in the region of the gluteal fold concerning for cellulitis. Electronically signed by: Greig Pique MD 12/08/2023 07:19 PM EDT RP Workstation: [id number]     .Incision and Drainage  Date/Time: 12/08/2023 8:29 PM  Performed by: Myriam Dorn BROCKS, PA Authorized by: Myriam Dorn BROCKS, PA   Consent:    Consent obtained:  Verbal   Consent given by:  Patient   Risks, benefits, and alternatives were discussed: yes     Risks discussed:  Bleeding, incomplete drainage, pain and infection   Alternatives discussed:  No treatment, delayed treatment, alternative treatment, referral and observation Universal protocol:    Procedure explained and questions answered to patient or proxy's satisfaction: yes     Patient identity confirmed:  Verbally with patient, arm band and  hospital-assigned identification number Location:    Type:  Abscess   Size:  2 cm   Location:  Anogenital   Anogenital location:  Gluteal cleft Pre-procedure details:    Skin preparation:  Antiseptic wash and povidone-iodine Sedation:    Sedation type:  None Anesthesia:    Anesthesia method:  Topical application and local infiltration   Topical anesthetic:  EMLA cream   Local anesthetic:  Lidocaine  2% WITH epi Procedure type:    Complexity:  Simple Procedure details:    Ultrasound guidance: yes     Needle aspiration: no     Incision types:  Stab incision   Incision depth:  Dermal   Drainage:  Bloody   Drainage amount:  Scant   Wound treatment:  Wound left open   Packing materials:  None Post-procedure details:    Procedure completion:  Tolerated well, no immediate complications    Medications Ordered in the ED  lactated ringers  infusion (has no administration in time range)  acetaminophen  (TYLENOL ) tablet 650 mg (650 mg Oral Given 12/08/23 1643)  lactated ringers  bolus 1,000 mL (0 mLs Intravenous Stopped 12/08/23 1908)    And  lactated ringers  bolus 1,000 mL (1,000 mLs Intravenous New Bag/Given 12/08/23 1903)  ceFEPIme (MAXIPIME) 2 g in sodium chloride  0.9 % 100 mL IVPB (0 g Intravenous Stopped 12/08/23 1810)  metroNIDAZOLE  (FLAGYL ) IVPB 500 mg (0 mg Intravenous Stopped 12/08/23 1927)  vancomycin  (VANCOCIN ) IVPB 1000 mg/200 mL premix (1,000 mg Intravenous New Bag/Given 12/08/23 1928)  HYDROmorphone  (DILAUDID ) injection 1 mg (1 mg Intravenous Given 12/08/23 1738)  iohexol  (OMNIPAQUE ) 300 MG/ML solution 100 mL (100 mLs Intravenous Contrast Given 12/08/23 1815)  HYDROmorphone  (DILAUDID ) injection 1 mg (1 mg Intravenous Given 12/08/23 1928)  ondansetron  (ZOFRAN ) injection 4 mg (4 mg Intravenous Given 12/08/23 1928)  lidocaine  (LMX) 4 % cream (1 Application Topical Given by Other 12/08/23 1933)  lidocaine  (XYLOCAINE ) 2 % (with pres) injection 400 mg (400 mg Infiltration Given by  Other 12/08/23 1934)                                    Medical Decision Making Amount and/or Complexity  of Data Reviewed Labs: ordered. Radiology: ordered.  Risk OTC drugs. Prescription drug management. Decision regarding hospitalization.   Medical Decision Making:   Valerie Walter is a 43 y.o. female who presented to the ED today with fever and chills along with firm tender mass at the superior aspect of the gluteal cleft detailed above.    Patient placed on continuous vitals and telemetry monitoring while in ED which was reviewed periodically.  Complete initial physical exam performed, notably the patient  was alert and oriented no apparent distress however very uncomfortable appearing having to lay on her right side to avoid pain.  Physical exam does show a firm tender nodule to the superior aspect of the gluteal cleft.  She is febrile.    Reviewed and confirmed nursing documentation for past medical history, family history, social history.    Initial Assessment:   With the patient's presentation of firm tender nodule to the superior aspect of the gluteal cleft, most likely diagnosis is pilonidal cyst.  However along with fever, chills, and fatigue will also consider possible onset of sepsis, due to tenderness in the area also consider osteomyelitis.  Initial Plan:  To evaluate for potential osteomyelitis, obtain CT imaging of the pelvis with contrast. Screening labs including CBC and Metabolic panel to evaluate for infectious or metabolic etiology of disease.  Urinalysis with reflex culture ordered to evaluate for UTI or relevant urologic/nephrologic pathology.  Secondary to fever along with presenting signs and symptoms, assess serum lactate along with blood cultures for likely development of sepsis. Obtain urine pregnancy to evaluate for safety of pharmacotherapy and imaging. EKG to evaluate for cardiac pathology Objective evaluation as below reviewed   Initial Study  Results:   Laboratory  All laboratory results reviewed without evidence of clinically relevant pathology.   Exceptions include: Leukocytosis of 14.4 along with a lactic acid of 2.9.  EKG EKG was reviewed independently. Rate, rhythm, axis, intervals all examined and without medically relevant abnormality. ST segments without concerns for elevations.    Radiology:  All images reviewed independently. Agree with radiology report at this time.   CT PELVIS W CONTRAST Result Date: 12/08/2023 EXAM: CT Pelvis, With IV Contrast 12/08/2023 06:39:09 PM TECHNIQUE: Axial images were acquired through the pelvis with IV contrast. Reformatted images were reviewed. Automated exposure control, iterative reconstruction, and/or weight based adjustment of the mA/kV was utilized to reduce the radiation dose to as low as reasonably achievable. 100 mL iohexol  (OMNIPAQUE ) 300 MG/ML solution. COMPARISON: CT abdomen and pelvis 11/24/2010. CLINICAL HISTORY: FINDINGS: BONES: No acute fracture or focal osseous lesion. JOINTS: No dislocation. The joint spaces are normal. SOFT TISSUES: There is subcutaneous edema in the region of the gluteal fold bilaterally. There is a rounded fluid collection just beneath the skin surface in the region of the superior gluteal fold at the level of the tip of the coccyx measuring 2.6 x 2.6 x 2.3 cm. There is a small fat-containing umbilical hernia. INTRAPELVIC CONTENTS: There is diffuse colonic diverticulosis. The appendix appears normal. Very slightly lobulated uterine contour. At least 1 exophytic fibroid is present anteriorly measuring 16 mm. IMPRESSION: 1. Rounded fluid collection just beneath the skin surface in the region of the superior gluteal fold measuring 2.6 x 2.6 x 2.3 cm, worrisome for phlegmon/abscess. 2. Subcutaneous edema in the region of the gluteal fold concerning for cellulitis. Electronically signed by: Greig Pique MD 12/08/2023 07:19 PM EDT RP Workstation: [id number]       Consults: Case discussed with Dr. [place] with  hospitalist team..   Reassessment and Plan:   Workup shows a leukocytosis of 14.4 along with a serum lactate of 2.9.  Along with fever and source of infection I will initiate sepsis workup and begin IV antibiotics.  Discussed that the patient need for admission for continued IV antibiotics, patient is initially hesitant secondary to cost however thoroughly discussed potential consequences of improper treatment of developing sepsis and patient agrees to IV antibiotics and admission for continued antibiotics and pain management.  As noted in procedure note, attempted I&D of the area with scant purulent as well as bloody drainage from the wound.  Ultrasound shows primary cobblestoning consistent with cellulitis.  This case was discussed with hospitalist team who accepts this patient for admission for continued IV antibiotics as well as hydration and management of her developing infection.       Final diagnoses:  Cellulitis and abscess of buttock    ED Discharge Orders     None          Myriam Dorn BROCKS, [place] 12/08/23 2032    Ruthe Cornet, DO 12/08/23 2201

## 2023-12-08 NOTE — H&P (Signed)
 History and Physical    Valerie Walter FMW:[id number] DOB: 1980/09/17 DOA: 12/08/2023  PCP: Pcp, No   Patient coming from: Home   Chief Complaint:  Chief Complaint  Patient presents with   Pilonidal Cyst   ED TRIAGE note:Pt is here for evaluation of evaluation of boil in gluteal fold which has been increasing in pain since yesterday.  Not draining.  Pt had same before. Pain is severe. Pt has had chills with this. Pt took 800mg  Ibuprofen  2 hours pta.    HPI:  43 year old female with history of hypothyroidism, ovarian cyst, cholelithiasis, ventral hernia and pilonidal cyst about 7 or 8 years ago presenting with recurrence of pilonidal cyst, fever, chills, and pain around the rectal area. Patient denies any abdominal pain, nausea and vomiting.  Denies any headache, chest pain palpitation and dizziness.  No other complaint at this time.  At presentation to ED febrile and tachycardic on arrival.  Not hypotensive.  WBC count 14.4, lactate 2.9> 2.9.  CMP showing low bicarb 18 elevated and gap 16.  Pregnancy test negative.  UA no evidence of UTI.  Blood cultures are in process   CT of pelvis with contrast showing: IMPRESSION: 1. Rounded fluid collection just beneath the skin surface in the region of the superior gluteal fold measuring 2.6 x 2.6 x 2.3 cm, worrisome for phlegmon/abscess. 2. Subcutaneous edema in the region of the gluteal fold concerning for cellulitis.  ED physician tried drainage of the abscess-attempted I&D of the area with scant purulence as well as bloody drainage from the wound. Patient was given vancomycin , cefepime, metronidazole , 2 L IV fluid boluses, and analgesics.    Has been transferred to [facility] for management of sepsis in the setting of pilonidal abscess and rectal cellulitis. Consulted general surgery Dr. Patrici for further evaluation and drainage of the rectal abscess.     Significant labs in the ED: Lab Orders         Resp panel by  RT-PCR (RSV, Flu A&B, Covid) Anterior Nasal Swab         Blood Culture (routine x 2)         Lactic acid, plasma         Comprehensive metabolic panel         CBC with Differential         Urinalysis, w/ Reflex to Culture (Infection Suspected) -Urine, Clean Catch         Pregnancy, urine         Protime-INR         Lactic acid, plasma         HIV Antibody (routine testing w rflx)         CBC         Comprehensive metabolic panel       Review of Systems:  Review of Systems  Constitutional:  Positive for chills and fever. Negative for malaise/fatigue and weight loss.  Respiratory:  Negative for cough, sputum production and shortness of breath.   Cardiovascular:  Negative for chest pain and palpitations.  Gastrointestinal:  Negative for abdominal pain, constipation, diarrhea, heartburn, nausea and vomiting.       Rectal pain  Genitourinary:  Negative for dysuria and urgency.  Musculoskeletal:  Negative for myalgias.  Neurological:  Negative for dizziness and headaches.  Psychiatric/Behavioral:  The patient is not nervous/anxious.     Past Medical History:  Diagnosis Date   Gallstones    Hypothyroidism    Miscarriage  Ovarian cyst    Seasonal allergies    Shingles    Thyroid  disease    Trichomonas    Umbilical hernia    Ventral hernia     Past Surgical History:  Procedure Laterality Date   ABDOMINAL SURGERY     APPENDECTOMY     DILATION AND CURETTAGE OF UTERUS     IRRIGATION AND DEBRIDEMENT ABSCESS     VENTRAL HERNIA REPAIR N/A 12/31/2021   Procedure: LAPAROSCOPIC ASSISTED VENTRAL HERNIA REPAIR;  Surgeon: Gladis Cough, MD;  Location: [facility];  Service: General;  Laterality: N/A;     reports that she quit smoking about 2 years ago. Her smoking use included cigarettes. She has never used smokeless tobacco. She reports current alcohol use. She reports that she does not use drugs.  Allergies  Allergen Reactions   Other Hives    All nuts    Family History   Problem Relation Age of Onset   Breast cancer Mother    Gout Brother    Diabetes Maternal Grandmother    Lupus Maternal Grandmother    Hypertension Maternal Grandmother    Colon cancer Neg Hx    Rectal cancer Neg Hx    Stomach cancer Neg Hx     Prior to Admission medications   Medication Sig Start Date End Date Taking? Authorizing Provider  amoxicillin -clavulanate (AUGMENTIN ) 875-125 MG tablet Take 1 tablet by mouth 2 (two) times daily. Patient not taking: Reported on 12/08/2023 12/11/21   Abran Norleen SAILOR, MD  EUTHYROX  50 MCG tablet Take 1 tablet (50 mcg total) by mouth daily. Patient not taking: Reported on 12/05/2021 04/30/21   Chandra Harlene LABOR, NP  HYDROcodone -acetaminophen  (NORCO/VICODIN) 5-325 MG tablet Take 1 tablet by mouth every 6 (six) hours as needed for moderate pain. Patient not taking: Reported on 12/08/2023 12/31/21   Gladis Cough, MD  ondansetron  (ZOFRAN ) 4 MG tablet Take 1 tablet (4 mg total) by mouth every 6 (six) hours. Patient not taking: Reported on 12/08/2023 12/31/21   Doretha Folks, MD  cromolyn  (NASALCROM ) 5.2 MG/ACT nasal spray Place 1 spray into both nostrils 4 (four) times daily. 01/15/17 03/22/19  Burky, Natalie B, NP  medroxyPROGESTERone (DEPO-PROVERA) 150 MG/ML injection Inject 150 mg into the muscle every 3 (three) months. Pt is having next dose on 02/16/16  03/22/19  [provider]     Physical Exam: Vitals:   12/08/23 1902 12/08/23 2130 12/08/23 2244 12/08/23 2254  BP: (!) 146/73 115/69 121/82   Pulse: 63 69 66   Resp: 16  18   Temp: 98.1 F (36.7 C)  98.8 F (37.1 C)   TempSrc: Oral  Oral   SpO2: 99% 99% 100%   Weight:    90.2 kg  Height:    5' 3 (1.6 m)    Physical Exam Constitutional:      Appearance: Normal appearance.  HENT:     Mouth/Throat:     Mouth: Mucous membranes are moist.  Eyes:     Pupils: Pupils are equal, round, and reactive to light.  Cardiovascular:     Rate and Rhythm: Normal rate and regular rhythm.      Pulses: Normal pulses.     Heart sounds: Normal heart sounds.  Pulmonary:     Effort: Pulmonary effort is normal.     Breath sounds: Normal breath sounds.  Abdominal:     Palpations: Abdomen is soft.  Musculoskeletal:     Cervical back: Neck supple.  Skin:    General: Skin is  dry.     Capillary Refill: Capillary refill takes less than 2 seconds.  Neurological:     Mental Status: She is alert and oriented to person, place, and time.  Psychiatric:        Mood and Affect: Mood normal.      Labs on Admission: I have personally reviewed following labs and imaging studies  CBC: Recent Labs  Lab 12/08/23 1619  WBC 14.4*  NEUTROABS 12.1*  HGB 13.1  HCT 39.8  MCV 86.1  PLT 306   Basic Metabolic Panel: Recent Labs  Lab 12/08/23 1619  NA 137  K 3.6  CL 104  CO2 18*  GLUCOSE 165*  BUN 7  CREATININE 0.80  CALCIUM 9.0   GFR: Estimated Creatinine Clearance: 95.6 mL/min (by C-G formula based on SCr of 0.8 mg/dL). Liver Function Tests: Recent Labs  Lab 12/08/23 1619  AST 20  ALT 19  ALKPHOS 79  BILITOT 0.8  PROT 7.8  ALBUMIN 4.2   No results for input(s): LIPASE, AMYLASE in the last 168 hours. No results for input(s): AMMONIA in the last 168 hours. Coagulation Profile: Recent Labs  Lab 12/08/23 1732  INR 1.1   Cardiac Enzymes: No results for input(s): CKTOTAL, CKMB, CKMBINDEX, TROPONINI, TROPONINIHS in the last 168 hours. BNP (last 3 results) No results for input(s): BNP in the last 8760 hours. HbA1C: No results for input(s): HGBA1C in the last 72 hours. CBG: No results for input(s): GLUCAP in the last 168 hours. Lipid Profile: No results for input(s): CHOL, HDL, LDLCALC, TRIG, CHOLHDL, LDLDIRECT in the last 72 hours. Thyroid  Function Tests: No results for input(s): TSH, T4TOTAL, FREET4, T3FREE, THYROIDAB in the last 72 hours. Anemia Panel: No results for input(s): VITAMINB12, FOLATE, FERRITIN,  TIBC, IRON, RETICCTPCT in the last 72 hours. Urine analysis:    Component Value Date/Time   COLORURINE YELLOW 12/08/2023 1619   APPEARANCEUR CLEAR 12/08/2023 1619   LABSPEC 1.020 12/08/2023 1619   PHURINE 5.5 12/08/2023 1619   GLUCOSEU NEGATIVE 12/08/2023 1619   HGBUR TRACE (A) 12/08/2023 1619   BILIRUBINUR NEGATIVE 12/08/2023 1619   KETONESUR NEGATIVE 12/08/2023 1619   PROTEINUR 30 (A) 12/08/2023 1619   UROBILINOGEN 0.2 12/21/2020 1333   NITRITE NEGATIVE 12/08/2023 1619   LEUKOCYTESUR NEGATIVE 12/08/2023 1619    Radiological Exams on Admission: I have personally reviewed images CT PELVIS W CONTRAST Result Date: 12/08/2023 EXAM: CT Pelvis, With IV Contrast 12/08/2023 06:39:09 PM TECHNIQUE: Axial images were acquired through the pelvis with IV contrast. Reformatted images were reviewed. Automated exposure control, iterative reconstruction, and/or weight based adjustment of the mA/kV was utilized to reduce the radiation dose to as low as reasonably achievable. 100 mL iohexol  (OMNIPAQUE ) 300 MG/ML solution. COMPARISON: CT abdomen and pelvis 11/24/2010. CLINICAL HISTORY: FINDINGS: BONES: No acute fracture or focal osseous lesion. JOINTS: No dislocation. The joint spaces are normal. SOFT TISSUES: There is subcutaneous edema in the region of the gluteal fold bilaterally. There is a rounded fluid collection just beneath the skin surface in the region of the superior gluteal fold at the level of the tip of the coccyx measuring 2.6 x 2.6 x 2.3 cm. There is a small fat-containing umbilical hernia. INTRAPELVIC CONTENTS: There is diffuse colonic diverticulosis. The appendix appears normal. Very slightly lobulated uterine contour. At least 1 exophytic fibroid is present anteriorly measuring 16 mm. IMPRESSION: 1. Rounded fluid collection just beneath the skin surface in the region of the superior gluteal fold measuring 2.6 x 2.6 x 2.3 cm, worrisome for  phlegmon/abscess. 2. Subcutaneous edema in the  region of the gluteal fold concerning for cellulitis. Electronically signed by: Greig Pique MD 12/08/2023 07:19 PM EDT RP Workstation: [id number]     EKG: My personal interpretation of EKG shows: Normal sinus rhythm heart rate 87    Assessment/Plan: Principal Problem:   Pilonidal abscess Active Problems:   Rectal cellulitis   Sepsis (HCC)   Hypothyroidism   Pilonidal abscess of natal cleft    Assessment and Plan: Pilonidal abscess of the gluteal fluid Related cellulitis Sepsis in the setting of bilateral abscess and rectal cellulitis -Presented emergency department complaining of rectal pain fever and chill.  Patient reported she has previous pilonidal cyst 7 to 8 years ago and concern for recurrence of the another cyst again.  At presentation to ED patient found febrile, tachycardic.  Afebrile.  Not hypotensive.  CBC showed leukocytosis.  Elevated lactic acid 2.9 - CT pelvis abdomen pelvis showing: 1. Rounded fluid collection just beneath the skin surface in the region of the superior gluteal fold measuring 2.6 x 2.6 x 2.3 cm, worrisome for phlegmon/abscess. 2. Subcutaneous edema in the region of the gluteal fold concerning for cellulitis. -Patient with sepsis criteria. -ED provider attempted I&D with scant purulent as well as bloody discharge from the wound. - In the ED code sepsis has been initiated patient received 2 L of LR bolus currently on maintenance fluid LR 150 cc/h.  Patient received IV mycin, metronidazole  and cefepime. - Pending blood cultures. - Continue broad-spectrum coverage with IV vancomycin  and cefepime. - Continue pain control with oxycodone  and IV morphine  as needed. -Consulted general surgery Dr. Stevie for further evaluation.  History of hypothyroidism - At home patient is not any medications currently     DVT prophylaxis:  Lovenox  Code Status:  Full Code Diet: Heart healthy diet Family Communication:   Family was present at bedside, at the  time of interview. Opportunity was given to ask question and all questions were answered satisfactorily.  Disposition Plan: Need to follow-up with blood culture result and general surgery recommendation Consults: General surgery Admission status:   Inpatient, Telemetry bed  Severity of Illness: The appropriate patient status for this patient is INPATIENT. Inpatient status is judged to be reasonable and necessary in order to provide the required intensity of service to ensure the patient's safety. The patient's presenting symptoms, physical exam findings, and initial radiographic and laboratory data in the context of their chronic comorbidities is felt to place them at high risk for further clinical deterioration. Furthermore, it is not anticipated that the patient will be medically stable for discharge from the hospital within 2 midnights of admission.   * I certify that at the point of admission it is my clinical judgment that the patient will require inpatient hospital care spanning beyond 2 midnights from the point of admission due to high intensity of service, high risk for further deterioration and high frequency of surveillance required.DEWAINE    Krysia Zahradnik, MD [facility]  How to contact the [facility] Attending or Consulting provider 7A - 7P or covering provider during after hours 7P -7A, for this patient.  Check the care team in [facility] and look for a) attending/consulting [facility] provider listed and b) the [facility] team listed Log into [email or web address] and use [facility] universal password to access. If you do not have the password, please contact the hospital operator. Locate the [facility] provider you are looking for under [facility] and page to a number that you can be directly  reached. If you still have difficulty reaching the provider, please page the [facility] (Director on Call) for the Hospitalists listed on amion for assistance.  12/08/2023, 11:05 PM

## 2023-12-08 NOTE — Progress Notes (Signed)
 Pharmacy Antibiotic Note  Valerie Walter is a 43 y.o. female admitted on 12/08/2023 for evaluation of boil in gluteal fold which has been increasing in pain since yesterday. SABRA  Pharmacy has been consulted for vancomycin  dosing, 1st dose given in ED  Plan: Vancomycin  750mg  IV q12h (AUC 503.8, Scr 0.8) Follow renal function and clinical course   Height: 5' 3 (160 cm) Weight: 90.2 kg (198 lb 13.7 oz) IBW/kg (Calculated) : 52.4  Temp (24hrs), Avg:99.6 F (37.6 C), Min:98.1 F (36.7 C), Max:102 F (38.9 C)  Recent Labs  Lab 12/08/23 1619 12/08/23 1835  WBC 14.4*  --   CREATININE 0.80  --   LATICACIDVEN 2.9* 2.9*    Estimated Creatinine Clearance: 95.6 mL/min (by C-G formula based on SCr of 0.8 mg/dL).    Allergies  Allergen Reactions   Other Hives    All nuts    Antimicrobials this admission: 10/27 vanc >> 10/27 cefepime x1 10/28 zosyn  >>  Dose adjustments this admission:   Microbiology results: 10/27 BCx:    Thank you for allowing pharmacy to be a part of this patient's care.  Leeroy Mace RPh 12/08/2023, 11:16 PM

## 2023-12-08 NOTE — ED Notes (Signed)
 Patient transported to CT

## 2023-12-09 ENCOUNTER — Encounter (HOSPITAL_COMMUNITY): Admission: EM | Disposition: A | Discharge status: Home / Self Care | Payer: Self-pay | Source: Home / Self Care

## 2023-12-09 ENCOUNTER — Inpatient Hospital Stay (HOSPITAL_COMMUNITY): Payer: MEDICAID | Admitting: Anesthesiology

## 2023-12-09 DIAGNOSIS — L0591 Pilonidal cyst without abscess: Secondary | ICD-10-CM | POA: Diagnosis not present

## 2023-12-09 HISTORY — PX: PILONIDAL CYST DRAINAGE: SHX743

## 2023-12-09 LAB — COMPREHENSIVE METABOLIC PANEL WITH GFR
ALT: 14 U/L (ref 0–44)
AST: 15 U/L (ref 15–41)
Albumin: 3.3 g/dL — ABNORMAL LOW (ref 3.5–5.0)
Alkaline Phosphatase: 56 U/L (ref 38–126)
Anion gap: 8 (ref 5–15)
BUN: 5 mg/dL — ABNORMAL LOW (ref 6–20)
CO2: 22 mmol/L (ref 22–32)
Calcium: 8.3 mg/dL — ABNORMAL LOW (ref 8.9–10.3)
Chloride: 107 mmol/L (ref 98–111)
Creatinine, Ser: 0.68 mg/dL (ref 0.44–1.00)
GFR, Estimated: >60 mL/min (ref >60)
Glucose, Bld: 112 mg/dL — ABNORMAL HIGH (ref 70–99)
Potassium: 3.7 mmol/L (ref 3.5–5.1)
Sodium: 136 mmol/L (ref 135–145)
Total Bilirubin: 0.9 mg/dL (ref 0.0–1.2)
Total Protein: 6.3 g/dL — ABNORMAL LOW (ref 6.5–8.1)

## 2023-12-09 LAB — CBC
HCT: 35.4 % — ABNORMAL LOW (ref 36.0–46.0)
Hemoglobin: 10.9 g/dL — ABNORMAL LOW (ref 12.0–15.0)
MCH: 27.5 pg (ref 26.0–34.0)
MCHC: 30.8 g/dL (ref 30.0–36.0)
MCV: 89.2 fL (ref 80.0–100.0)
Platelets: 260 K/uL (ref 150–400)
RBC: 3.97 MIL/uL (ref 3.87–5.11)
RDW: 14.6 % (ref 11.5–15.5)
WBC: 16.8 K/uL — ABNORMAL HIGH (ref 4.0–10.5)
nRBC: 0 % (ref 0.0–0.2)

## 2023-12-09 LAB — LACTIC ACID, PLASMA: Lactic Acid, Venous: 1.1 mmol/L (ref 0.5–1.9)

## 2023-12-09 LAB — HIV ANTIBODY (ROUTINE TESTING W REFLEX): HIV Screen 4th Generation wRfx: NONREACTIVE

## 2023-12-09 SURGERY — EXCISION, PILONIDAL CYST
Anesthesia: General | Site: Rectum

## 2023-12-09 MED ORDER — BUPIVACAINE-EPINEPHRINE (PF) 0.25% -1:200000 IJ SOLN
INTRAMUSCULAR | Status: AC
Start: 2023-12-09 — End: 2023-12-09
  Filled 2023-12-09: qty 30

## 2023-12-09 MED ORDER — PROPOFOL 10 MG/ML IV BOLUS
INTRAVENOUS | Status: DC | PRN
  Administered 2023-12-09: 50 mg via INTRAVENOUS
  Administered 2023-12-09: 150 mg via INTRAVENOUS

## 2023-12-09 MED ORDER — ROCURONIUM BROMIDE 100 MG/10ML IV SOLN
INTRAVENOUS | Status: DC | PRN
  Administered 2023-12-09: 40 mg via INTRAVENOUS

## 2023-12-09 MED ORDER — GLYCOPYRROLATE 0.2 MG/ML IJ SOLN
INTRAMUSCULAR | Status: DC | PRN
  Administered 2023-12-09: .1 mg via INTRAVENOUS

## 2023-12-09 MED ORDER — LACTATED RINGERS IV SOLN: INTRAVENOUS | Status: AC

## 2023-12-09 MED ORDER — ACETAMINOPHEN 500 MG PO TABS
1000.0000 mg | ORAL_TABLET | Freq: Four times a day (QID) | ORAL | Status: DC
  Administered 2023-12-09 – 2023-12-10 (×2): 1000 mg via ORAL
  Filled 2023-12-09 (×2): qty 2

## 2023-12-09 MED ORDER — MIDAZOLAM HCL 5 MG/5ML IJ SOLN
INTRAMUSCULAR | Status: DC | PRN
  Administered 2023-12-09: 2 mg via INTRAVENOUS

## 2023-12-09 MED ORDER — CHLORHEXIDINE GLUCONATE 0.12 % MT SOLN
15.0000 mL | Freq: Once | OROMUCOSAL | Status: AC
  Administered 2023-12-09: 15 mL via OROMUCOSAL

## 2023-12-09 MED ORDER — PIPERACILLIN-TAZOBACTAM 3.375 G IVPB
3.3750 g | Freq: Three times a day (TID) | INTRAVENOUS | Status: AC
  Administered 2023-12-09 – 2023-12-10 (×2): 3.375 g via INTRAVENOUS
  Filled 2023-12-09 (×2): qty 50

## 2023-12-09 MED ORDER — MIDAZOLAM HCL 2 MG/2ML IJ SOLN
INTRAMUSCULAR | Status: AC
  Filled 2023-12-09: qty 2

## 2023-12-09 MED ORDER — BUPIVACAINE-EPINEPHRINE 0.25% -1:200000 IJ SOLN
INTRAMUSCULAR | Status: DC | PRN
  Administered 2023-12-09: 30 mL

## 2023-12-09 MED ORDER — DEXAMETHASONE SOD PHOSPHATE PF 10 MG/ML IJ SOLN
INTRAMUSCULAR | Status: DC | PRN
  Administered 2023-12-09: 5 mg via INTRAVENOUS

## 2023-12-09 MED ORDER — OXYCODONE HCL 5 MG PO TABS
5.0000 mg | ORAL_TABLET | ORAL | Status: DC | PRN
  Administered 2023-12-09 – 2023-12-10 (×3): 10 mg via ORAL
  Filled 2023-12-09 (×3): qty 2

## 2023-12-09 MED ORDER — DROPERIDOL 2.5 MG/ML IJ SOLN: 0.6250 mg | Freq: Once | INTRAMUSCULAR | Status: DC | PRN

## 2023-12-09 MED ORDER — LIDOCAINE HCL (CARDIAC) PF 100 MG/5ML IV SOSY
PREFILLED_SYRINGE | INTRAVENOUS | Status: DC | PRN
  Administered 2023-12-09: 100 mg via INTRAVENOUS

## 2023-12-09 MED ORDER — IBUPROFEN 400 MG PO TABS
800.0000 mg | ORAL_TABLET | Freq: Three times a day (TID) | ORAL | Status: DC
  Administered 2023-12-09 – 2023-12-10 (×3): 800 mg via ORAL
  Filled 2023-12-09 (×3): qty 2

## 2023-12-09 MED ORDER — SODIUM CHLORIDE 0.9 % IR SOLN
Status: DC | PRN
  Administered 2023-12-09: 1000 mL

## 2023-12-09 MED ORDER — LACTATED RINGERS IV SOLN: INTRAVENOUS | Status: DC

## 2023-12-09 MED ORDER — FENTANYL CITRATE (PF) 100 MCG/2ML IJ SOLN
INTRAMUSCULAR | Status: AC
  Filled 2023-12-09: qty 2

## 2023-12-09 MED ORDER — SUGAMMADEX SODIUM 200 MG/2ML IV SOLN
INTRAVENOUS | Status: DC | PRN
  Administered 2023-12-09: 200 mg via INTRAVENOUS

## 2023-12-09 MED ORDER — FENTANYL CITRATE (PF) 250 MCG/5ML IJ SOLN
INTRAMUSCULAR | Status: AC
  Filled 2023-12-09: qty 5

## 2023-12-09 MED ORDER — FENTANYL CITRATE (PF) 50 MCG/ML IJ SOSY
25.0000 ug | PREFILLED_SYRINGE | INTRAMUSCULAR | Status: DC | PRN
  Administered 2023-12-09: 50 ug via INTRAVENOUS

## 2023-12-09 MED ORDER — ORAL CARE MOUTH RINSE: 15.0000 mL | Freq: Once | OROMUCOSAL | Status: AC

## 2023-12-09 MED ORDER — OXYCODONE HCL 5 MG PO TABS: 5.0000 mg | ORAL_TABLET | Freq: Once | ORAL | Status: DC | PRN

## 2023-12-09 MED ORDER — OXYCODONE HCL 5 MG/5ML PO SOLN: 5.0000 mg | Freq: Once | ORAL | Status: DC | PRN

## 2023-12-09 MED ORDER — METHOCARBAMOL 500 MG PO TABS
1000.0000 mg | ORAL_TABLET | Freq: Three times a day (TID) | ORAL | Status: DC
  Administered 2023-12-09 – 2023-12-10 (×3): 1000 mg via ORAL
  Filled 2023-12-09 (×3): qty 2

## 2023-12-09 MED ORDER — SUCCINYLCHOLINE CHLORIDE 200 MG/10ML IV SOSY
PREFILLED_SYRINGE | INTRAVENOUS | Status: DC | PRN
  Administered 2023-12-09: 120 mg via INTRAVENOUS

## 2023-12-09 MED ORDER — FENTANYL CITRATE (PF) 100 MCG/2ML IJ SOLN
INTRAMUSCULAR | Status: DC | PRN
  Administered 2023-12-09 (×2): 100 ug via INTRAVENOUS

## 2023-12-09 MED ORDER — ONDANSETRON HCL 4 MG/2ML IJ SOLN
INTRAMUSCULAR | Status: AC
  Filled 2023-12-09: qty 2

## 2023-12-09 MED ORDER — DEXMEDETOMIDINE HCL IN NACL 80 MCG/20ML IV SOLN
INTRAVENOUS | Status: DC | PRN
  Administered 2023-12-09: 12 ug via INTRAVENOUS
  Administered 2023-12-09: 8 ug via INTRAVENOUS

## 2023-12-09 MED ORDER — SUGAMMADEX SODIUM 200 MG/2ML IV SOLN
INTRAVENOUS | Status: AC
  Filled 2023-12-09: qty 2

## 2023-12-09 MED ORDER — ONDANSETRON HCL 4 MG/2ML IJ SOLN
INTRAMUSCULAR | Status: DC | PRN
  Administered 2023-12-09: 4 mg via INTRAVENOUS

## 2023-12-09 MED ORDER — AMOXICILLIN-POT CLAVULANATE 875-125 MG PO TABS
1.0000 | ORAL_TABLET | Freq: Two times a day (BID) | ORAL | Status: DC
Start: 2023-12-10 — End: 2023-12-17
  Filled 2023-12-09: qty 1

## 2023-12-09 MED ORDER — ROCURONIUM BROMIDE 10 MG/ML (PF) SYRINGE
PREFILLED_SYRINGE | INTRAVENOUS | Status: AC
  Filled 2023-12-09: qty 10

## 2023-12-09 MED ORDER — FENTANYL CITRATE (PF) 50 MCG/ML IJ SOSY
PREFILLED_SYRINGE | INTRAMUSCULAR | Status: AC
  Filled 2023-12-09: qty 1

## 2023-12-09 MED ORDER — MORPHINE SULFATE (PF) 2 MG/ML IV SOLN
1.0000 mg | INTRAVENOUS | Status: DC | PRN
  Administered 2023-12-09 (×2): 1 mg via INTRAVENOUS
  Filled 2023-12-09 (×2): qty 1

## 2023-12-09 MED ORDER — LIDOCAINE HCL (PF) 2 % IJ SOLN
INTRAMUSCULAR | Status: AC
Start: 2023-12-09 — End: 2023-12-09
  Filled 2023-12-09: qty 5

## 2023-12-09 SURGICAL SUPPLY — 30 items
BENZOIN TINCTURE PRP APPL 2/3 (GAUZE/BANDAGES/DRESSINGS) IMPLANT
BLADE EXTENDED COATED 6.5IN (ELECTRODE) IMPLANT
BLADE SURG 15 STRL LF DISP TIS (BLADE) ×1 IMPLANT
BRIEF MESH DISP LRG (UNDERPADS AND DIAPERS) IMPLANT
COVER SURGICAL LIGHT HANDLE (MISCELLANEOUS) ×1 IMPLANT
DRAIN PENROSE 0.25X18 (DRAIN) IMPLANT
DRAPE LAPAROTOMY T 98X78 PEDS (DRAPES) ×1 IMPLANT
DRAPE UTILITY XL STRL (DRAPES) ×1 IMPLANT
ELECT REM PT RETURN 15FT ADLT (MISCELLANEOUS) ×1 IMPLANT
GAUZE 4X4 16PLY ~~LOC~~+RFID DBL (SPONGE) ×1 IMPLANT
GAUZE PAD ABD 8X10 STRL (GAUZE/BANDAGES/DRESSINGS) ×1 IMPLANT
GAUZE SPONGE 4X4 12PLY STRL (GAUZE/BANDAGES/DRESSINGS) ×1 IMPLANT
GLOVE BIO SURGEON STRL SZ 6 (GLOVE) ×1 IMPLANT
GLOVE INDICATOR 6.5 STRL GRN (GLOVE) ×1 IMPLANT
GOWN STRL REUS W/ TWL LRG LVL3 (GOWN DISPOSABLE) ×1 IMPLANT
IV CATH 14GX2 1/4 (CATHETERS) IMPLANT
KIT BASIN OR (CUSTOM PROCEDURE TRAY) ×1 IMPLANT
KIT TURNOVER KIT A (KITS) ×1 IMPLANT
NDL HYPO 25X1 1.5 SAFETY (NEEDLE) ×1 IMPLANT
NEEDLE HYPO 25X1 1.5 SAFETY (NEEDLE) ×1 IMPLANT
PACK BASIC VI WITH GOWN DISP (CUSTOM PROCEDURE TRAY) ×1 IMPLANT
PENCIL SMOKE EVACUATOR (MISCELLANEOUS) ×1 IMPLANT
PUNCH BIOPSY 3 (MISCELLANEOUS) IMPLANT
SUT CHROMIC 3 0 SH 27 (SUTURE) IMPLANT
SYR 10ML LL (SYRINGE) IMPLANT
SYR 20ML LL LF (SYRINGE) IMPLANT
SYR CONTROL 10ML LL (SYRINGE) ×1 IMPLANT
TOWEL OR 17X26 10 PK STRL BLUE (TOWEL DISPOSABLE) ×2 IMPLANT
TUBING CONNECTING 10 (TUBING) ×1 IMPLANT
YANKAUER SUCT BULB TIP NO VENT (SUCTIONS) ×1 IMPLANT

## 2023-12-09 NOTE — Transfer of Care (Signed)
 Immediate Anesthesia Transfer of Care Note  Patient: Valerie Walter  Procedure(s) Performed: EXCISION, PILONIDAL CYST (Rectum)  Patient Location: PACU  Anesthesia Type:General  Level of Consciousness: awake, alert , oriented, and patient cooperative  Airway & Oxygen Therapy: Patient Spontanous Breathing and Patient connected to face mask oxygen  Post-op Assessment: Report given to RN and Post -op Vital signs reviewed and stable  Post vital signs: Reviewed and stable  Last Vitals:  Vitals Value Taken Time  BP    Temp    Pulse    Resp    SpO2      Last Pain:  Vitals:   12/09/23 1118  TempSrc:   PainSc: 10-Worst pain ever      Patients Stated Pain Goal: 4 (12/09/23 1118)  Complications: There were no known notable events for this encounter.

## 2023-12-09 NOTE — Anesthesia Preprocedure Evaluation (Addendum)
 Anesthesia Evaluation  Patient identified by MRN, date of birth, ID band Patient awake    Reviewed: Allergy & Precautions, NPO status , Patient's Chart, lab work & pertinent test results  History of Anesthesia Complications Negative for: history of anesthetic complications  Airway Mallampati: IV  TM Distance: >3 FB Neck ROM: Full  Mouth opening: Limited Mouth Opening  Dental  (+) Dental Advisory Given, Poor Dentition, Chipped, Edentulous Upper, Edentulous Lower, Missing   Pulmonary former smoker   Pulmonary exam normal breath sounds clear to auscultation       Cardiovascular negative cardio ROS Normal cardiovascular exam Rhythm:Regular Rate:Normal     Neuro/Psych negative neurological ROS  negative psych ROS   GI/Hepatic Neg liver ROS,GERD  Controlled,,  Endo/Other  Hypothyroidism    Renal/GU negative Renal ROS     Musculoskeletal negative musculoskeletal ROS (+)    Abdominal  (+) + obese  Peds  Hematology negative hematology ROS (+)   Anesthesia Other Findings   Reproductive/Obstetrics                              Anesthesia Physical Anesthesia Plan  ASA: 2  Anesthesia Plan: General   Post-op Pain Management: Tylenol  PO (pre-op)*   Induction: Intravenous  PONV Risk Score and Plan: 3 and Treatment may vary due to age or medical condition, Ondansetron , Dexamethasone  and Midazolam   Airway Management Planned: Oral ETT and Video Laryngoscope Planned  Additional Equipment: None  Intra-op Plan:   Post-operative Plan: Extubation in OR  Informed Consent: I have reviewed the patients History and Physical, chart, labs and discussed the procedure including the risks, benefits and alternatives for the proposed anesthesia with the patient or authorized representative who has indicated his/her understanding and acceptance.     Dental advisory given  Plan Discussed with:  CRNA  Anesthesia Plan Comments:         Anesthesia Quick Evaluation

## 2023-12-09 NOTE — Plan of Care (Signed)
 ?  Problem: Clinical Measurements: ?Goal: Ability to maintain clinical measurements within normal limits will improve ?Outcome: Progressing ?Goal: Will remain free from infection ?Outcome: Progressing ?Goal: Diagnostic test results will improve ?Outcome: Progressing ?  ?

## 2023-12-09 NOTE — H&P (Signed)
 H&P Note  Valerie Walter [birth year]  [id number].    Requesting MD: Subrina Sundil, MD Chief Complaint/Reason for Consult: Pilonidal abscess HPI:  Patient is a 43 year old female who reports no chronic medical issues who presented to the the ED overnight with pain in gluteal area for the last 2 days. Pain intensified over the last 24 hrs. She has had I&D for abscess in this area previously about 8-9 years ago. She is not on blood thinners. NKDA. She reports occasional wine but does not drink alcohol regularly. Denies illicit drug use. Quit smoking 2 years ago.   It was not reported to myself or bedside RN but reportedly patient had some crackers and juice around 6 AM.   ROS: Negative other than HPI  Family History  Problem Relation Age of Onset   Breast cancer Mother    Gout Brother    Diabetes Maternal Grandmother    Lupus Maternal Grandmother    Hypertension Maternal Grandmother    Colon cancer Neg Hx    Rectal cancer Neg Hx    Stomach cancer Neg Hx     Past Medical History:  Diagnosis Date   Gallstones    Hypothyroidism    Miscarriage    Ovarian cyst    Seasonal allergies    Shingles    Thyroid  disease    Trichomonas    Umbilical hernia    Ventral hernia     Past Surgical History:  Procedure Laterality Date   ABDOMINAL SURGERY     APPENDECTOMY     DILATION AND CURETTAGE OF UTERUS     IRRIGATION AND DEBRIDEMENT ABSCESS     VENTRAL HERNIA REPAIR N/A 12/31/2021   Procedure: LAPAROSCOPIC ASSISTED VENTRAL HERNIA REPAIR;  Surgeon: Gladis Cough, MD;  Location: [facility];  Service: General;  Laterality: N/A;    Social History:  reports that she quit smoking about 2 years ago. Her smoking use included cigarettes. She has never used smokeless tobacco. She reports current alcohol use. She reports that she does not use drugs.  Allergies:  Allergies  Allergen Reactions   Other Hives    All nuts    Medications Prior to Admission  Medication Sig Dispense  Refill   ibuprofen  (ADVIL ) 800 MG tablet Take 800 mg by mouth every 8 (eight) hours as needed.     amoxicillin -clavulanate (AUGMENTIN ) 875-125 MG tablet Take 1 tablet by mouth 2 (two) times daily. (Patient not taking: Reported on 12/08/2023) 20 tablet 0   EUTHYROX  50 MCG tablet Take 1 tablet (50 mcg total) by mouth daily. (Patient not taking: Reported on 12/05/2021) 30 tablet 0   HYDROcodone -acetaminophen  (NORCO/VICODIN) 5-325 MG tablet Take 1 tablet by mouth every 6 (six) hours as needed for moderate pain. (Patient not taking: Reported on 12/08/2023) 20 tablet 0   ondansetron  (ZOFRAN ) 4 MG tablet Take 1 tablet (4 mg total) by mouth every 6 (six) hours. (Patient not taking: Reported on 12/08/2023) 12 tablet 0    Blood pressure 117/70, pulse 75, temperature 99.1 F (37.3 C), temperature source Oral, resp. rate 18, height 5' 3 (1.6 m), weight 90.2 kg, last menstrual period 11/13/2023, SpO2 98%. Physical Exam:  General: pleasant, WD, overweight female who is laying in bed in NAD Heart: regular, rate, and rhythm.  Lungs:  Respiratory effort nonlabored Abd: soft, NT, ND GU: incision difficult to see due to patient discomfort, induration and ttp of gluteal clefts bilaterally but L>R MS: all 4 extremities are symmetrical with no  cyanosis, clubbing, or edema. Psych: A&Ox3 with an appropriate affect.   Results for orders placed or performed during the hospital encounter of 12/08/23 (from the past 48 hours)  Lactic acid, plasma     Status: Abnormal   Collection Time: 12/08/23  4:19 PM  Result Value Ref Range   Lactic Acid, Venous 2.9 (HH) 0.5 - 1.9 mmol/L    Comment: Critical Value, Read Back and verified with Hildegard Asa RN at 1650  on 12/08/23 by I.Sugut Performed at [facility], [address] [place], [place] [address]   Comprehensive metabolic panel     Status: Abnormal   Collection Time: 12/08/23  4:19 PM  Result Value Ref Range   Sodium 137 135 - 145 mmol/L   Potassium 3.6 3.5  - 5.1 mmol/L   Chloride 104 98 - 111 mmol/L   CO2 18 (L) 22 - 32 mmol/L   Glucose, Bld 165 (H) 70 - 99 mg/dL    Comment: Glucose reference range applies only to samples taken after fasting for at least 8 hours.   BUN 7 6 - 20 mg/dL   Creatinine, Ser 9.19 0.44 - 1.00 mg/dL   Calcium 9.0 8.9 - 89.6 mg/dL   Total Protein 7.8 6.5 - 8.1 g/dL   Albumin 4.2 3.5 - 5.0 g/dL   AST 20 15 - 41 U/L   ALT 19 0 - 44 U/L   Alkaline Phosphatase 79 38 - 126 U/L   Total Bilirubin 0.8 0.0 - 1.2 mg/dL   GFR, Estimated >39 >39 mL/min    Comment: (NOTE) Calculated using the CKD-EPI Creatinine Equation (2021)    Anion gap 16 (H) 5 - 15    Comment: Performed at [facility], [address] [place], [place] [address]  CBC with Differential     Status: Abnormal   Collection Time: 12/08/23  4:19 PM  Result Value Ref Range   WBC 14.4 (H) 4.0 - 10.5 K/uL   RBC 4.62 3.87 - 5.11 MIL/uL   Hemoglobin 13.1 12.0 - 15.0 g/dL   HCT 60.1 63.9 - 53.9 %   MCV 86.1 80.0 - 100.0 fL   MCH 28.4 26.0 - 34.0 pg   MCHC 32.9 30.0 - 36.0 g/dL   RDW 85.3 88.4 - 84.4 %   Platelets 306 150 - 400 K/uL   nRBC 0.0 0.0 - 0.2 %   Neutrophils Relative % 85 %   Neutro Abs 12.1 (H) 1.7 - 7.7 K/uL   Lymphocytes Relative 12 %   Lymphs Abs 1.7 0.7 - 4.0 K/uL   Monocytes Relative 3 %   Monocytes Absolute 0.4 0.1 - 1.0 K/uL   Eosinophils Relative 0 %   Eosinophils Absolute 0.0 0.0 - 0.5 K/uL   Basophils Relative 0 %   Basophils Absolute 0.0 0.0 - 0.1 K/uL   Immature Granulocytes 0 %   Abs Immature Granulocytes 0.06 0.00 - 0.07 K/uL    Comment: Performed at [facility], [address] [place], [place] [address]  Urinalysis, w/ Reflex to Culture (Infection Suspected) -Urine, Clean Catch     Status: Abnormal   Collection Time: 12/08/23  4:19 PM  Result Value Ref Range   Specimen Source URINE, CLEAN CATCH    Color, Urine YELLOW YELLOW   APPearance CLEAR CLEAR   Specific Gravity, Urine 1.020 1.005 - 1.030    pH 5.5 5.0 - 8.0   Glucose, UA NEGATIVE NEGATIVE mg/dL   Hgb urine  dipstick TRACE (A) NEGATIVE   Bilirubin Urine NEGATIVE NEGATIVE   Ketones, ur NEGATIVE NEGATIVE mg/dL   Protein, ur 30 (A) NEGATIVE mg/dL   Nitrite NEGATIVE NEGATIVE   Leukocytes,Ua NEGATIVE NEGATIVE   Squamous Epithelial / HPF 6-10 0 - 5 /HPF   WBC, UA 0-5 0 - 5 WBC/hpf    Comment: Reflex urine culture not performed if WBC <=10, OR if Squamous epithelial cells >5. If Squamous epithelial cells >5, suggest recollection.   RBC / HPF 0-5 0 - 5 RBC/hpf   Bacteria, UA FEW (A) NONE SEEN   Mucus PRESENT     Comment: Performed at [facility], [address] [place], [place] [address]  Pregnancy, urine     Status: None   Collection Time: 12/08/23  4:19 PM  Result Value Ref Range   Preg Test, Ur NEGATIVE NEGATIVE    Comment:        THE SENSITIVITY OF THIS METHODOLOGY IS >20 mIU/mL. Performed at [facility], [address] [place], [place] [address]   Resp panel by RT-PCR (RSV, Flu A&B, Covid) Peripheral     Status: None   Collection Time: 12/08/23  5:32 PM   Specimen: Peripheral; Nasal Swab  Result Value Ref Range   SARS Coronavirus 2 by RT PCR NEGATIVE NEGATIVE    Comment: (NOTE) SARS-CoV-2 target nucleic acids are NOT DETECTED.  The SARS-CoV-2 RNA is generally detectable in upper respiratory specimens during the acute phase of infection. The lowest concentration of SARS-CoV-2 viral copies this assay can detect is 138 copies/mL. A negative result does not preclude SARS-Cov-2 infection and should not be used as the sole basis for treatment or other patient management decisions. A negative result may occur with  improper specimen collection/handling, submission of specimen other than nasopharyngeal swab, presence of viral mutation(s) within the areas targeted by this assay, and inadequate number of viral copies(<138 copies/mL). A negative result must be combined with clinical observations,  patient history, and epidemiological information. The expected result is Negative.  Fact Sheet for Patients:  bloggercourse.com  Fact Sheet for Healthcare Providers:  seriousbroker.it  This test is no t yet approved or cleared by the [place]  FDA and  has been authorized for detection and/or diagnosis of SARS-CoV-2 by FDA under an Emergency Use Authorization (EUA). This EUA will remain  in effect (meaning this test can be used) for the duration of the COVID-19 declaration under Section 564(b)(1) of the Act, 21 U.S.C.section 360bbb-3(b)(1), unless the authorization is terminated  or revoked sooner.       Influenza A by PCR NEGATIVE NEGATIVE   Influenza B by PCR NEGATIVE NEGATIVE    Comment: (NOTE) The Xpert Xpress SARS-CoV-2/FLU/RSV plus assay is intended as an aid in the diagnosis of influenza from Nasopharyngeal swab specimens and should not be used as a sole basis for treatment. Nasal washings and aspirates are unacceptable for Xpert Xpress SARS-CoV-2/FLU/RSV testing.  Fact Sheet for Patients: bloggercourse.com  Fact Sheet for Healthcare Providers: seriousbroker.it  This test is not yet approved or cleared by the [place]  FDA and has been authorized for detection and/or diagnosis of SARS-CoV-2 by FDA under an Emergency Use Authorization (EUA). This EUA will remain in effect (meaning this test can be used) for the duration of the COVID-19 declaration under Section 564(b)(1) of the Act, 21 U.S.C. section 360bbb-3(b)(1), unless the authorization is terminated or revoked.     Resp Syncytial Virus by PCR NEGATIVE NEGATIVE  Comment: (NOTE) Fact Sheet for Patients: bloggercourse.com  Fact Sheet for Healthcare Providers: seriousbroker.it  This test is not yet approved or cleared by the [place]  FDA and has  been authorized for detection and/or diagnosis of SARS-CoV-2 by FDA under an Emergency Use Authorization (EUA). This EUA will remain in effect (meaning this test can be used) for the duration of the COVID-19 declaration under Section 564(b)(1) of the Act, 21 U.S.C. section 360bbb-3(b)(1), unless the authorization is terminated or revoked.  Performed at [facility], [address] [place], [place] [address]   Protime-INR     Status: None   Collection Time: 12/08/23  5:32 PM  Result Value Ref Range   Prothrombin Time 14.6 11.4 - 15.2 seconds   INR 1.1 0.8 - 1.2    Comment: (NOTE) INR goal varies based on device and disease states. Performed at [facility], [address] [place], [place] [address]   Lactic acid, plasma     Status: Abnormal   Collection Time: 12/08/23  6:35 PM  Result Value Ref Range   Lactic Acid, Venous 2.9 (HH) 0.5 - 1.9 mmol/L    Comment: CRITICAL VALUE NOTED.  VALUE IS CONSISTENT WITH PREVIOUSLY REPORTED AND CALLED VALUE. Performed at [facility], [address] [place], [place] [address]   Lactic acid, plasma     Status: None   Collection Time: 12/08/23 11:32 PM  Result Value Ref Range   Lactic Acid, Venous 1.1 0.5 - 1.9 mmol/L    Comment: Performed at [facility], [address], [place], [place] [address]  HIV Antibody (routine testing w rflx)     Status: None   Collection Time: 12/08/23 11:32 PM  Result Value Ref Range   HIV Screen 4th Generation wRfx Non Reactive Non Reactive    Comment: Performed at [facility], [address], [place], [place] [address]  CBC     Status: Abnormal   Collection Time: 12/09/23  4:55 AM  Result Value Ref Range   WBC 16.8 (H) 4.0 - 10.5 K/uL   RBC 3.97 3.87 - 5.11 MIL/uL   Hemoglobin 10.9 (L) 12.0 - 15.0 g/dL   HCT 64.5 (L) 63.9 - 53.9 %   MCV 89.2 80.0 - 100.0 fL   MCH 27.5 26.0 - 34.0 pg   MCHC 30.8 30.0 - 36.0 g/dL   RDW 85.3 88.4 - 84.4 %    Platelets 260 150 - 400 K/uL   nRBC 0.0 0.0 - 0.2 %    Comment: Performed at [facility], [address] [place], [place] [address]  Comprehensive metabolic panel     Status: Abnormal   Collection Time: 12/09/23  4:55 AM  Result Value Ref Range   Sodium 136 135 - 145 mmol/L   Potassium 3.7 3.5 - 5.1 mmol/L   Chloride 107 98 - 111 mmol/L   CO2 22 22 - 32 mmol/L   Glucose, Bld 112 (H) 70 - 99 mg/dL    Comment: Glucose reference range applies only to samples taken after fasting for at least 8 hours.   BUN 5 (L) 6 - 20 mg/dL   Creatinine, Ser 9.31 0.44 - 1.00 mg/dL   Calcium 8.3 (L) 8.9 - 10.3 mg/dL   Total Protein 6.3 (L) 6.5 - 8.1 g/dL   Albumin 3.3 (L) 3.5 - 5.0 g/dL   AST 15 15 - 41 U/L   ALT 14 0 - 44  U/L   Alkaline Phosphatase 56 38 - 126 U/L   Total Bilirubin 0.9 0.0 - 1.2 mg/dL   GFR, Estimated >39 >39 mL/min    Comment: (NOTE) Calculated using the CKD-EPI Creatinine Equation (2021)    Anion gap 8 5 - 15    Comment: Performed at [facility], [address] [place], [place] [address]   CT PELVIS W CONTRAST Result Date: 12/08/2023 EXAM: CT Pelvis, With IV Contrast 12/08/2023 06:39:09 PM TECHNIQUE: Axial images were acquired through the pelvis with IV contrast. Reformatted images were reviewed. Automated exposure control, iterative reconstruction, and/or weight based adjustment of the mA/kV was utilized to reduce the radiation dose to as low as reasonably achievable. 100 mL iohexol  (OMNIPAQUE ) 300 MG/ML solution. COMPARISON: CT abdomen and pelvis 11/24/2010. CLINICAL HISTORY: FINDINGS: BONES: No acute fracture or focal osseous lesion. JOINTS: No dislocation. The joint spaces are normal. SOFT TISSUES: There is subcutaneous edema in the region of the gluteal fold bilaterally. There is a rounded fluid collection just beneath the skin surface in the region of the superior gluteal fold at the level of the tip of the coccyx measuring 2.6 x 2.6 x 2.3  cm. There is a small fat-containing umbilical hernia. INTRAPELVIC CONTENTS: There is diffuse colonic diverticulosis. The appendix appears normal. Very slightly lobulated uterine contour. At least 1 exophytic fibroid is present anteriorly measuring 16 mm. IMPRESSION: 1. Rounded fluid collection just beneath the skin surface in the region of the superior gluteal fold measuring 2.6 x 2.6 x 2.3 cm, worrisome for phlegmon/abscess. 2. Subcutaneous edema in the region of the gluteal fold concerning for cellulitis. Electronically signed by: Greig Pique MD 12/08/2023 07:19 PM EDT RP Workstation: [id number]      Assessment/Plan Pilonidal abscess - CT pelvis yesterday with fluid collection just beneath the skin surface in the region of the superior gluteal fold measuring 2.6 x 2.6 x 2.3 cm, worrisome for phlegmon/abscess. - ED attempted I&D yesterday  - WBC 16.8, lactic acidosis improved, afeb, HD stable - will take to OR today for EUA and I&D to ensure adequate drainage  FEN: NPO, IVF@150  cc/h VTE: LMWH ID: Zosyn /vanc   I reviewed ED provider notes, hospitalist notes, last 24 h vitals and pain scores, last 48 h intake and output, last 24 h labs and trends, and last 24 h imaging results.  This care required high  level of medical decision making.   Valerie Walter, [facility] 12/09/2023, 8:27 AM Please see Amion for pager number during day hours 7:00am-4:30pm

## 2023-12-09 NOTE — Progress Notes (Signed)
 Patient admitted last night.  H&P reviewed.  Patient seen and examined.  This is a 43 year old female without any significant past medical history who comes in with complaints of pain in her buttock area.  She was found to have cellulitis and possible abscess.  She was hospitalized for further management.  Noted to have a fever of 102 F yesterday evening. S1-S2 is normal regular.  No murmur appreciated. Lungs are clear to auscultation bilaterally. Abdomen is soft. Lamination of the buttock area will be deferred to general surgery.  Her WBC noted to be elevated.  She also had lactic acidosis which has improved.  CT report reviewed.  Patient was examined with general surgery PA.  Since patient is otherwise healthy General Surgery plans to take this patient onto their service.  Plan is for surgery later today.  TRH will be available as needed.  At this time we will sign off.    This is a no charge note.  Joette Pebbles 12/09/2023

## 2023-12-09 NOTE — Anesthesia Procedure Notes (Signed)
 Procedure Name: Intubation Date/Time: 12/09/2023 12:32 PM  Performed by: Erick Fitz, CRNAPre-anesthesia Checklist: Patient identified, Emergency Drugs available, Suction available, Patient being monitored and Timeout performed Patient Re-evaluated:Patient Re-evaluated prior to induction Oxygen Delivery Method: Circle system utilized Preoxygenation: Pre-oxygenation with 100% oxygen Induction Type: IV induction Ventilation: Mask ventilation without difficulty Laryngoscope Size: Glidescope and 4 (GlideScope S4 utilized) Grade View: Grade I Tube type: Oral Tube size: 7.0 mm Number of attempts: 1 Airway Equipment and Method: Stylet (GlideScope stylet) Placement Confirmation: ETT inserted through vocal cords under direct vision, positive ETCO2, CO2 detector and breath sounds checked- equal and bilateral Secured at: 22 cm Tube secured with: Tape (secured with pink Hy-tape) Dental Injury: Teeth and Oropharynx as per pre-operative assessment  Comments: Patient EDENTULOUS

## 2023-12-10 ENCOUNTER — Encounter (HOSPITAL_COMMUNITY): Payer: Self-pay | Admitting: Surgery

## 2023-12-10 ENCOUNTER — Other Ambulatory Visit (HOSPITAL_COMMUNITY): Payer: Self-pay

## 2023-12-10 LAB — CBC
HCT: 31.9 % — ABNORMAL LOW (ref 36.0–46.0)
Hemoglobin: 10.3 g/dL — ABNORMAL LOW (ref 12.0–15.0)
MCH: 28.9 pg (ref 26.0–34.0)
MCHC: 32.3 g/dL (ref 30.0–36.0)
MCV: 89.6 fL (ref 80.0–100.0)
Platelets: 253 K/uL (ref 150–400)
RBC: 3.56 MIL/uL — ABNORMAL LOW (ref 3.87–5.11)
RDW: 14.7 % (ref 11.5–15.5)
WBC: 17.2 K/uL — ABNORMAL HIGH (ref 4.0–10.5)
nRBC: 0 % (ref 0.0–0.2)

## 2023-12-10 LAB — BASIC METABOLIC PANEL WITH GFR
Anion gap: 10 (ref 5–15)
BUN: 7 mg/dL (ref 6–20)
CO2: 22 mmol/L (ref 22–32)
Calcium: 8 mg/dL — ABNORMAL LOW (ref 8.9–10.3)
Chloride: 104 mmol/L (ref 98–111)
Creatinine, Ser: 0.75 mg/dL (ref 0.44–1.00)
GFR, Estimated: >60 mL/min (ref >60)
Glucose, Bld: 119 mg/dL — ABNORMAL HIGH (ref 70–99)
Potassium: 3.5 mmol/L (ref 3.5–5.1)
Sodium: 135 mmol/L (ref 135–145)

## 2023-12-10 MED ORDER — OXYCODONE HCL 5 MG PO TABS
5.0000 mg | ORAL_TABLET | ORAL | 0 refills | Status: AC | PRN
  Filled 2023-12-10: qty 30, 3d supply, fill #0

## 2023-12-10 MED ORDER — METHOCARBAMOL 500 MG PO TABS
1000.0000 mg | ORAL_TABLET | Freq: Three times a day (TID) | ORAL | 0 refills | Status: AC | PRN
  Filled 2023-12-10: qty 30, 5d supply, fill #0

## 2023-12-10 MED ORDER — IBUPROFEN 800 MG PO TABS
800.0000 mg | ORAL_TABLET | Freq: Three times a day (TID) | ORAL | 0 refills | Status: AC | PRN
  Filled 2023-12-10: qty 30, 10d supply, fill #0

## 2023-12-10 MED ORDER — AMOXICILLIN-POT CLAVULANATE 600-42.9 MG/5ML PO SUSR
800.0000 mg | Freq: Two times a day (BID) | ORAL | 0 refills | Status: AC
  Filled 2023-12-10: qty 125, 9d supply, fill #0

## 2023-12-10 MED ORDER — ACETAMINOPHEN 500 MG PO TABS: 1000.0000 mg | ORAL_TABLET | Freq: Four times a day (QID) | ORAL | Status: AC | PRN

## 2023-12-10 MED ORDER — AMOXICILLIN-POT CLAVULANATE 400-57 MG/5ML PO SUSR
800.0000 mg | Freq: Two times a day (BID) | ORAL | Status: DC
Start: 2023-12-10 — End: 2023-12-10
  Administered 2023-12-10: 800 mg via ORAL
  Filled 2023-12-10: qty 10

## 2023-12-10 NOTE — Progress Notes (Signed)
 Discharge medication delivered to patient at the bedside in a secure bag

## 2023-12-10 NOTE — Plan of Care (Signed)
 ?  Problem: Clinical Measurements: ?Goal: Ability to maintain clinical measurements within normal limits will improve ?Outcome: Progressing ?Goal: Will remain free from infection ?Outcome: Progressing ?Goal: Diagnostic test results will improve ?Outcome: Progressing ?  ?

## 2023-12-10 NOTE — Progress Notes (Signed)
   12/10/23 0951  TOC Brief Assessment  Insurance and Status Reviewed  Patient has primary care physician No  Home environment has been reviewed home with children  Prior level of function: independent  Prior/Current Home Services No current home services  Social Drivers of Health Review SDOH reviewed no interventions necessary  Readmission risk has been reviewed Yes  Transition of care needs no transition of care needs at this time

## 2023-12-10 NOTE — Discharge Summary (Signed)
 [facility] Discharge Summary   Patient ID: Valerie Walter MRN: [id number] DOB/AGE: [birth year] 43 y.o.  Admit date: 12/08/2023 Discharge date: 12/10/2023  Admitting Diagnosis: Pilonidal abscess  Discharge Diagnosis Gluteal cellulitis   Consultants Internal medicine   Imaging: CT PELVIS W CONTRAST Result Date: 12/08/2023 EXAM: CT Pelvis, With IV Contrast 12/08/2023 06:39:09 PM TECHNIQUE: Axial images were acquired through the pelvis with IV contrast. Reformatted images were reviewed. Automated exposure control, iterative reconstruction, and/or weight based adjustment of the mA/kV was utilized to reduce the radiation dose to as low as reasonably achievable. 100 mL iohexol  (OMNIPAQUE ) 300 MG/ML solution. COMPARISON: CT abdomen and pelvis 11/24/2010. CLINICAL HISTORY: FINDINGS: BONES: No acute fracture or focal osseous lesion. JOINTS: No dislocation. The joint spaces are normal. SOFT TISSUES: There is subcutaneous edema in the region of the gluteal fold bilaterally. There is a rounded fluid collection just beneath the skin surface in the region of the superior gluteal fold at the level of the tip of the coccyx measuring 2.6 x 2.6 x 2.3 cm. There is a small fat-containing umbilical hernia. INTRAPELVIC CONTENTS: There is diffuse colonic diverticulosis. The appendix appears normal. Very slightly lobulated uterine contour. At least 1 exophytic fibroid is present anteriorly measuring 16 mm. IMPRESSION: 1. Rounded fluid collection just beneath the skin surface in the region of the superior gluteal fold measuring 2.6 x 2.6 x 2.3 cm, worrisome for phlegmon/abscess. 2. Subcutaneous edema in the region of the gluteal fold concerning for cellulitis. Electronically signed by: Greig Pique MD 12/08/2023 07:19 PM EDT RP Workstation: [id number]    Procedures Dorn Dec (12/08/23) - Incision and drainage  Dr. Dreama Hanger (12/09/23) - Exam under anesthesia with incision and  drainage  Hospital Course:  Patient is a 43 year old female who presented to [facility] with gluteal pain.  Workup showed pilonidal abscess. Drainage attempted by ED provider, but patient with lactic acidosis and still having significant pain. Patient was admitted and underwent procedure listed above.  Tolerated procedure well and was transferred to the floor.  Diet was advanced as tolerated.  On POD1, the patient was voiding well, tolerating diet, ambulating well, pain well controlled, vital signs stable, incisions c/d/i and felt stable for discharge home.  Patient will follow up in our office in 2 weeks and knows to call with questions or concerns.    Physical Exam: General:  Alert, NAD, pleasant, comfortable GU: bilateral gluteal incisions with sutures present, some purulent drainage but significant improvement in TTP and surrounding induration   I or a member of my team have reviewed this patient in the Controlled Substance Database.   Allergies as of 12/10/2023       Reactions   Other Hives   All nuts        Medication List     TAKE these medications    acetaminophen  500 MG tablet Commonly known as: TYLENOL  Take 2 tablets (1,000 mg total) by mouth every 6 (six) hours as needed for mild pain (pain score 1-3), fever or headache.   amoxicillin -clavulanate 400-57 MG/5ML suspension Commonly known as: AUGMENTIN  Take 10 mLs (800 mg total) by mouth every 12 (twelve) hours for 7 days.   ibuprofen  800 MG tablet Commonly known as: ADVIL  Take 1 tablet (800 mg total) by mouth every 8 (eight) hours as needed for moderate pain (pain score 4-6). TAKE WITH FOOD What changed:  reasons to take this additional instructions   methocarbamol  500 MG tablet Commonly known as: ROBAXIN  Take 2 tablets (1,000 mg  total) by mouth every 8 (eight) hours as needed for muscle spasms.   oxyCODONE  5 MG immediate release tablet Commonly known as: Oxy IR/ROXICODONE  Take 1-2 tablets (5-10 mg total) by mouth  every 4 (four) hours as needed for moderate pain (pain score 4-6) or severe pain (pain score 7-10).          Follow-up Information     Paola Dreama SAILOR, MD. Go on 12/25/2023.   Specialty: Surgery Why: 10:10 AM, please arrive 30 min prior to appointment time to check in. Contact information: [address] [place] [address] [facility] [place] [address] [phone]                 Signed: Burnard JONELLE Louder , [facility] 12/10/2023, 8:46 AM Please see Amion for pager number during day hours 7:00am-4:30pm

## 2023-12-10 NOTE — Anesthesia Postprocedure Evaluation (Signed)
 Anesthesia Post Note  Patient: Valerie Walter  Procedure(s) Performed: EXCISION, PILONIDAL CYST (Rectum)     Patient location during evaluation: PACU Anesthesia Type: General Level of consciousness: sedated and patient cooperative Pain management: pain level controlled Vital Signs Assessment: post-procedure vital signs reviewed and stable Respiratory status: spontaneous breathing Cardiovascular status: stable Anesthetic complications: no   There were no known notable events for this encounter.  Last Vitals:  Vitals:   12/10/23 0554 12/10/23 1003  BP: (!) 100/53 124/69  Pulse: (!) 52 (!) 53  Resp: 16 16  Temp: 36.8 C 37 C  SpO2: 100% 100%    Last Pain:  Vitals:   12/10/23 1003  TempSrc: Oral  PainSc:                  [name]

## 2023-12-10 NOTE — Progress Notes (Signed)
 Patient discharged home, IV removed, discharge paperwork provided and explained, patient verbalized understanding, wound dressing supplies given to patient prior to discharge.

## 2023-12-10 NOTE — Op Note (Addendum)
   Operative Note   Date: 12/10/2023  Procedure: exam under anesthesia, incision and drainage of gluteal cellulitis  Pre-op diagnosis: suspected pilonidal abscess Post-op diagnosis: gluteal cellultis  Indication and clinical history: The patient is a 43 y.o. year old female with suspected pilonidal abscess  Surgeon: Dreama GEANNIE Hanger, MD  Anesthesiologist: Darlyn, MD Anesthesia: General  Findings:  Specimen: none EBL: <5cc Drains/Implants: none  Disposition: PACU - hemodynamically stable.  Description of procedure: The patient was positioned supine on the operating room table. General anesthetic induction and intubation were uneventful. The patient was repositioned to prone. Time-out was performed verifying correct patient, procedure, signature of informed consent, and administration of pre-operative antibiotics. The gluteus was prepped and draped in the usual sterile fashion.  Examination revealed extensive induration, R>L, evidence of a pit inferiorly and moderate to severe scarring on the right from probable prior abscesses. An attempt at aspiration was made bilaterally, but no fluid was able to be obtained. There was a skin defect on the right that was opened bluntly with a hemostat and a tract created. A counter incision was made, but without any fluid drainage, I elected to open the tract to more completely evaluate. Digital probing was performed and did not reveal any additional areas of fluctuance or suspected undrained collection. A single vertical mattress suture was used to close the wound. Attention was then taken to the left. Again, aspiration was attempted without return of fluid. A small incision was made over an area of existing skin breakdown and also did not reveal any additional areas of fluctuance or suspected undrained collection. A single vertical mattress suture was used to close the wound.  Sterile dressings were applied. All sponge and instrument counts were  correct at the conclusion of the procedure. The patient was awakened from anesthesia, extubated uneventfully, and transported to the PACU in good condition. There were no complications.   Upon entering the abdomen (organ space), I encountered a phlegmon involving the gluteal folds.  CASE DATA:  Type of patient?: LDOW CASE (Surgical Hospitalist [facility] Inpatient)  Status of Case? URGENT Add On  Infection Present At Time Of Surgery (PATOS)?  PHLEGMON involving the gluteal folds    Dreama GEANNIE Hanger, MD General and Trauma Surgery [facility] Surgery

## 2023-12-13 LAB — CULTURE, BLOOD (ROUTINE X 2)
Culture: NO GROWTH
Culture: NO GROWTH
Special Requests: ADEQUATE
Special Requests: ADEQUATE

## 2023-12-26 DIAGNOSIS — Z803 Family history of malignant neoplasm of breast: Secondary | ICD-10-CM

## 2023-12-29 ENCOUNTER — Other Ambulatory Visit: Payer: Self-pay

## 2023-12-29 DIAGNOSIS — C50919 Malignant neoplasm of unspecified site of unspecified female breast: Secondary | ICD-10-CM
